# Patient Record
Sex: Male | Born: 1975 | Race: Black or African American | Hispanic: No | Marital: Single | Smoking: Never smoker
Health system: Southern US, Community
[De-identification: ages and names within clinical notes are randomized; demographics above are authoritative.]

## PROBLEM LIST (undated history)

## (undated) DIAGNOSIS — Q2112 Patent foramen ovale: Secondary | ICD-10-CM

## (undated) DIAGNOSIS — I639 Cerebral infarction, unspecified: Secondary | ICD-10-CM

## (undated) DIAGNOSIS — Q211 Atrial septal defect: Secondary | ICD-10-CM

## (undated) DIAGNOSIS — D573 Sickle-cell trait: Secondary | ICD-10-CM

## (undated) SURGERY — Surgical Case
Anesthesia: *Unknown

---

## 1999-05-20 ENCOUNTER — Emergency Department (HOSPITAL_COMMUNITY): Admission: EM | Admit: 1999-05-20 | Discharge: 1999-05-20 | Payer: Self-pay | Admitting: Emergency Medicine

## 1999-05-20 ENCOUNTER — Encounter: Payer: Self-pay | Admitting: Internal Medicine

## 1999-11-04 ENCOUNTER — Emergency Department (HOSPITAL_COMMUNITY): Admission: EM | Admit: 1999-11-04 | Discharge: 1999-11-04 | Payer: Self-pay | Admitting: *Deleted

## 2002-02-27 ENCOUNTER — Ambulatory Visit (HOSPITAL_COMMUNITY): Admission: RE | Admit: 2002-02-27 | Discharge: 2002-02-27 | Payer: Self-pay | Admitting: Family Medicine

## 2002-02-27 ENCOUNTER — Encounter: Payer: Self-pay | Admitting: Family Medicine

## 2002-10-15 ENCOUNTER — Emergency Department (HOSPITAL_COMMUNITY): Admission: EM | Admit: 2002-10-15 | Discharge: 2002-10-15 | Payer: Self-pay | Admitting: Emergency Medicine

## 2003-07-06 ENCOUNTER — Emergency Department (HOSPITAL_COMMUNITY): Admission: EM | Admit: 2003-07-06 | Discharge: 2003-07-07 | Payer: Self-pay | Admitting: Emergency Medicine

## 2005-03-09 ENCOUNTER — Emergency Department (HOSPITAL_COMMUNITY): Admission: EM | Admit: 2005-03-09 | Discharge: 2005-03-09 | Payer: Self-pay | Admitting: *Deleted

## 2013-05-01 ENCOUNTER — Emergency Department (HOSPITAL_COMMUNITY)
Admission: EM | Admit: 2013-05-01 | Discharge: 2013-05-01 | Disposition: A | Discharge status: Home / Self Care | Payer: No Typology Code available for payment source | Source: Home / Self Care | Attending: Emergency Medicine | Admitting: Emergency Medicine

## 2013-05-01 ENCOUNTER — Encounter (HOSPITAL_COMMUNITY): Payer: Self-pay | Admitting: Emergency Medicine

## 2013-05-01 DIAGNOSIS — M545 Low back pain, unspecified: Secondary | ICD-10-CM

## 2013-05-01 MED ORDER — HYDROCODONE-ACETAMINOPHEN 5-325 MG PO TABS: ORAL_TABLET | ORAL | Status: DC

## 2013-05-01 MED ORDER — METHYLPREDNISOLONE ACETATE 80 MG/ML IJ SUSP
80.0000 mg | Freq: Once | INTRAMUSCULAR | Status: AC
  Administered 2013-05-01: 80 mg via INTRAMUSCULAR

## 2013-05-01 MED ORDER — KETOROLAC TROMETHAMINE 60 MG/2ML IM SOLN
60.0000 mg | Freq: Once | INTRAMUSCULAR | Status: AC
  Administered 2013-05-01: 60 mg via INTRAMUSCULAR

## 2013-05-01 MED ORDER — DICLOFENAC SODIUM 75 MG PO TBEC: 75.0000 mg | DELAYED_RELEASE_TABLET | Freq: Two times a day (BID) | ORAL | Status: DC

## 2013-05-01 MED ORDER — KETOROLAC TROMETHAMINE 60 MG/2ML IM SOLN
INTRAMUSCULAR | Status: AC
  Filled 2013-05-01: qty 2

## 2013-05-01 MED ORDER — CYCLOBENZAPRINE HCL 5 MG PO TABS: 5.0000 mg | ORAL_TABLET | Freq: Three times a day (TID) | ORAL | Status: DC | PRN

## 2013-05-01 MED ORDER — METHYLPREDNISOLONE ACETATE 80 MG/ML IJ SUSP
INTRAMUSCULAR | Status: AC
  Filled 2013-05-01: qty 1

## 2013-05-01 NOTE — Discharge Instructions (Signed)
Do exercises twice daily followed by moist heat for 15 minutes. ° ° ° ° ° °Try to be as active as possible. ° °If no better in 2 weeks, follow up with orthopedist. ° ° °

## 2013-05-01 NOTE — ED Provider Notes (Signed)
  Chief Complaint   Chief Complaint  Patient presents with  . Back Pain    History of Present Illness   Daniel Schneider is a 38 year old male who's had a two-day history of lower back pain. He denies any injury. The back has started hurting on its own. There is no radiation the pain. It hurts to bend and lift. He denies any numbness, tingling, or weakness in the lower extremities. No bladder or bowel dysfunction. No saddle anesthesia. Denies fever, chills, or weight loss. No prior history of lower back pain.  Review of Systems   Other than as noted above, the patient denies any of the following symptoms: Systemic:  No fever, chills, or unexplained weight loss. GI:  No abdominal painor incontinence of bowel. GU:  No dysuria, frequency, urgency, or hematuria. No incontinence of urine or urinary retention.  M-S:  No neck pain or arthritis. Neuro:  No paresthesias, headache, saddle anesthesia, muscular weakness, or progressive neurological deficit.  [place]   Past medical history, family history, social history, meds, and allergies were reviewed. Specifically, there is no history of cancer, major trauma, osteoporosis, immunosuppression, or HIV infection.   Physical Examination    Vital signs:  BP 124/68  Pulse 60  Temp(Src) 98.3 F (36.8 C) (Oral)  Resp 14  SpO2 100% General:  Alert, oriented, in no distress. Abdomen:  Soft, non-tender.  No organomegaly or mass.  No pulsatile midline abdominal mass or bruit. Back:  There is tenderness to palpation in the paravertebral muscles of the lower back with muscle spasm. His back has a very limited range of motion with 30 of forward flexion, 10 of backward flexion, 20 of lateral bending, and 45 of rotation with pain. Straight leg raising produces lower back and buttock pain bilaterally but no ringing pain. Neuro:  Normal muscle strength, sensations and DTRs. Extremities: Pedal pulses were full, there was no edema. Skin:  Clear, warm and  dry.  No rash.  Course in [facility] [place]   Given Toradol 60 mg IM and Depo-Medrol 80 mg IM.    Assessment   The encounter diagnosis was Lumbago.  Plan     1.  Meds:  The following meds were prescribed:   Discharge Medication List as of 05/01/2013 11:40 AM    START taking these medications   Details  cyclobenzaprine (FLEXERIL) 5 MG tablet Take 1 tablet (5 mg total) by mouth 3 (three) times daily as needed for muscle spasms., Starting 05/01/2013, Until Discontinued, Normal    diclofenac (VOLTAREN) 75 MG EC tablet Take 1 tablet (75 mg total) by mouth 2 (two) times daily., Starting 05/01/2013, Until Discontinued, Normal    HYDROcodone-acetaminophen (NORCO/VICODIN) 5-325 MG per tablet 1 to 2 tabs every 4 to 6 hours as needed for pain., Print        2.  Patient Education/Counseling:  The patient was given appropriate handouts, self care instructions, and instructed in symptomatic relief. The patient was encouraged to try to be as active as possible and given some exercises to do followed by moist heat.  3.  Follow up:  The patient was told to follow up here if no better in 3 to 4 days, or sooner if becoming worse in any way, and given some red flag symptoms such as worsening pain or new neurological symptoms which would prompt immediate return.  Follow up with Dr. Fredonia Highland if no better in 2 weeks.     Harden Mo, MD 05/01/13 [phone]

## 2013-05-01 NOTE — ED Notes (Signed)
Patient complains of lower back pain; states stiffness and sharpness that keeps him from moving normally.

## 2014-03-06 ENCOUNTER — Encounter (HOSPITAL_COMMUNITY): Payer: Self-pay | Admitting: Emergency Medicine

## 2014-03-06 ENCOUNTER — Emergency Department (INDEPENDENT_AMBULATORY_CARE_PROVIDER_SITE_OTHER)
Admission: EM | Admit: 2014-03-06 | Discharge: 2014-03-06 | Disposition: A | Discharge status: Home / Self Care | Payer: No Typology Code available for payment source | Source: Home / Self Care | Attending: Family Medicine | Admitting: Family Medicine

## 2014-03-06 DIAGNOSIS — M6248 Contracture of muscle, other site: Secondary | ICD-10-CM

## 2014-03-06 DIAGNOSIS — M62838 Other muscle spasm: Secondary | ICD-10-CM

## 2014-03-06 MED ORDER — TRAMADOL HCL 50 MG PO TABS: 50.0000 mg | ORAL_TABLET | Freq: Four times a day (QID) | ORAL | Status: DC | PRN

## 2014-03-06 MED ORDER — KETOROLAC TROMETHAMINE 60 MG/2ML IM SOLN
INTRAMUSCULAR | Status: AC
  Filled 2014-03-06: qty 2

## 2014-03-06 MED ORDER — DICLOFENAC SODIUM 75 MG PO TBEC: 75.0000 mg | DELAYED_RELEASE_TABLET | Freq: Two times a day (BID) | ORAL | Status: DC | PRN

## 2014-03-06 MED ORDER — KETOROLAC TROMETHAMINE 60 MG/2ML IM SOLN
60.0000 mg | Freq: Once | INTRAMUSCULAR | Status: AC
  Administered 2014-03-06: 60 mg via INTRAMUSCULAR

## 2014-03-06 MED ORDER — CYCLOBENZAPRINE HCL 10 MG PO TABS: 10.0000 mg | ORAL_TABLET | Freq: Every evening | ORAL | Status: DC | PRN

## 2014-03-06 NOTE — ED Provider Notes (Signed)
Daniel Schneider is a 39 y.o. male who presents to Urgent Care today for left-sided neck pain. Patient has pain in the left trapezius present for one day. He denies any injury radiating pain weakness or numbness. Symptoms are moderate. No fevers or chills. Patient has tried Tylenol which helps. 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   History reviewed. No pertinent past medical history. History reviewed. No pertinent past surgical history. History  Substance Use Topics  . Smoking status: Never Smoker   . Smokeless tobacco: Not on file  . Alcohol Use: No   ROS as above Medications: Current Facility-Administered Medications  Medication Dose Route Frequency Provider Last Rate Last Dose  . ketorolac (TORADOL) injection 60 mg  60 mg Intramuscular Once Gregor Hams, MD       Current Outpatient Prescriptions  Medication Sig Dispense Refill  . cyclobenzaprine (FLEXERIL) 10 MG tablet Take 1 tablet (10 mg total) by mouth at bedtime as needed for muscle spasms. 20 tablet 0  . diclofenac (VOLTAREN) 75 MG EC tablet Take 1 tablet (75 mg total) by mouth 2 (two) times daily as needed. 30 tablet 0  . traMADol (ULTRAM) 50 MG tablet Take 1 tablet (50 mg total) by mouth every 6 (six) hours as needed. 15 tablet 0   No Known Allergies   Exam:  BP 139/89 mmHg  Pulse 57  Temp(Src) 97.9 F (36.6 C) (Oral)  Resp 16  SpO2 98% Gen: Well NAD HEENT: EOMI,  MMM Lungs: Normal work of breathing. CTABL Heart: RRR no MRG Abd: NABS, Soft. Nondistended, Nontender Exts: Brisk capillary refill, warm and well perfused.  Neck: Nontender to spinal midline. Tender palpation left trapezius. Normal range of motion. Left shoulder nontender normal range of motion negative impingement testing normal strength Right shoulder nontender normal range of motion negative impingement testing normal strength  No results found for this or any previous visit (from the past 24 hour(s)). No results  found.  Assessment and Plan: 39 y.o. male with trapezius strain. Treat with Toradol shot in clinic. We'll use 60 mg IM. Additionally we'll use oral Flexeril diclofenac and tramadol for pain control. Home exercise program and heating pad. Follow up with sports medicine if not improving.  Discussed warning signs or symptoms. Please see discharge instructions. Patient expresses understanding.     Gregor Hams, MD 03/06/14 [phone]

## 2014-03-06 NOTE — Discharge Instructions (Signed)
Thank you for coming in today. Come back or go to the emergency room if you notice new weakness new numbness problems walking or bowel or bladder problems. Do not take tramadol and drive.   Cervical Strain and Sprain (Whiplash) with Rehab Cervical strain and sprain are injuries that commonly occur with "whiplash" injuries. Whiplash occurs when the neck is forcefully whipped backward or forward, such as during a motor vehicle accident or during contact sports. The muscles, ligaments, tendons, discs, and nerves of the neck are susceptible to injury when this occurs. RISK FACTORS Risk of having a whiplash injury increases if:  Osteoarthritis of the spine.  Situations that make head or neck accidents or trauma more likely.  High-risk sports (football, rugby, wrestling, hockey, auto racing, gymnastics, diving, contact karate, or boxing).  Poor strength and flexibility of the neck.  Previous neck injury.  Poor tackling technique.  Improperly fitted or padded equipment. SYMPTOMS   Pain or stiffness in the front or back of neck or both.  Symptoms may present immediately or up to 24 hours after injury.  Dizziness, headache, nausea, and vomiting.  Muscle spasm with soreness and stiffness in the neck.  Tenderness and swelling at the injury site. PREVENTION  Learn and use proper technique (avoid tackling with the head, spearing, and head-butting; use proper falling techniques to avoid landing on the head).  Warm up and stretch properly before activity.  Maintain physical fitness:  Strength, flexibility, and endurance.  Cardiovascular fitness.  Wear properly fitted and padded protective equipment, such as padded soft collars, for participation in contact sports. PROGNOSIS  Recovery from cervical strain and sprain injuries is dependent on the extent of the injury. These injuries are usually curable in 1 week to 3 months with appropriate treatment.  RELATED COMPLICATIONS    Temporary numbness and weakness may occur if the nerve roots are damaged, and this may persist until the nerve has completely healed.  Chronic pain due to frequent recurrence of symptoms.  Prolonged healing, especially if activity is resumed too soon (before complete recovery). TREATMENT  Treatment initially involves the use of ice and medication to help reduce pain and inflammation. It is also important to perform strengthening and stretching exercises and modify activities that worsen symptoms so the injury does not get worse. These exercises may be performed at home or with a therapist. For patients who experience severe symptoms, a soft, padded collar may be recommended to be worn around the neck.  Improving your posture may help reduce symptoms. Posture improvement includes pulling your chin and abdomen in while sitting or standing. If you are sitting, sit in a firm chair with your buttocks against the back of the chair. While sleeping, try replacing your pillow with a small towel rolled to 2 inches in diameter, or use a cervical pillow or soft cervical collar. Poor sleeping positions delay healing.  For patients with nerve root damage, which causes numbness or weakness, the use of a cervical traction apparatus may be recommended. Surgery is rarely necessary for these injuries. However, cervical strain and sprains that are present at birth (congenital) may require surgery. MEDICATION   If pain medication is necessary, nonsteroidal anti-inflammatory medications, such as aspirin and ibuprofen, or other minor pain relievers, such as acetaminophen, are often recommended.  Do not take pain medication for 7 days before surgery.  Prescription pain relievers may be given if deemed necessary by your caregiver. Use only as directed and only as much as you need. HEAT AND COLD:  Cold treatment (icing) relieves pain and reduces inflammation. Cold treatment should be applied for 10 to 15 minutes every  2 to 3 hours for inflammation and pain and immediately after any activity that aggravates your symptoms. Use ice packs or an ice massage.  Heat treatment may be used prior to performing the stretching and strengthening activities prescribed by your caregiver, physical therapist, or athletic trainer. Use a heat pack or a warm soak. SEEK MEDICAL CARE IF:   Symptoms get worse or do not improve in 2 weeks despite treatment.  New, unexplained symptoms develop (drugs used in treatment may produce side effects). EXERCISES RANGE OF MOTION (ROM) AND STRETCHING EXERCISES - Cervical Strain and Sprain These exercises may help you when beginning to rehabilitate your injury. In order to successfully resolve your symptoms, you must improve your posture. These exercises are designed to help reduce the forward-head and rounded-shoulder posture which contributes to this condition. Your symptoms may resolve with or without further involvement from your physician, physical therapist or athletic trainer. While completing these exercises, remember:   Restoring tissue flexibility helps normal motion to return to the joints. This allows healthier, less painful movement and activity.  An effective stretch should be held for at least 20 seconds, although you may need to begin with shorter hold times for comfort.  A stretch should never be painful. You should only feel a gentle lengthening or release in the stretched tissue. STRETCH- Axial Extensors  Lie on your back on the floor. You may bend your knees for comfort. Place a rolled-up hand towel or dish towel, about 2 inches in diameter, under the part of your head that makes contact with the floor.  Gently tuck your chin, as if trying to make a "double chin," until you feel a gentle stretch at the base of your head.  Hold __________ seconds. Repeat __________ times. Complete this exercise __________ times per day.  STRETCH - Axial Extension   Stand or sit on a firm  surface. Assume a good posture: chest up, shoulders drawn back, abdominal muscles slightly tense, knees unlocked (if standing) and feet hip width apart.  Slowly retract your chin so your head slides back and your chin slightly lowers. Continue to look straight ahead.  You should feel a gentle stretch in the back of your head. Be certain not to feel an aggressive stretch since this can cause headaches later.  Hold for __________ seconds. Repeat __________ times. Complete this exercise __________ times per day. STRETCH - Cervical Side Bend   Stand or sit on a firm surface. Assume a good posture: chest up, shoulders drawn back, abdominal muscles slightly tense, knees unlocked (if standing) and feet hip width apart.  Without letting your nose or shoulders move, slowly tip your right / left ear to your shoulder until your feel a gentle stretch in the muscles on the opposite side of your neck.  Hold __________ seconds. Repeat __________ times. Complete this exercise __________ times per day. STRETCH - Cervical Rotators   Stand or sit on a firm surface. Assume a good posture: chest up, shoulders drawn back, abdominal muscles slightly tense, knees unlocked (if standing) and feet hip width apart.  Keeping your eyes level with the ground, slowly turn your head until you feel a gentle stretch along the back and opposite side of your neck.  Hold __________ seconds. Repeat __________ times. Complete this exercise __________ times per day. RANGE OF MOTION - Neck Circles   Stand or sit on a  firm surface. Assume a good posture: chest up, shoulders drawn back, abdominal muscles slightly tense, knees unlocked (if standing) and feet hip width apart.  Gently roll your head down and around from the back of one shoulder to the back of the other. The motion should never be forced or painful.  Repeat the motion 10-20 times, or until you feel the neck muscles relax and loosen. Repeat __________ times. Complete  the exercise __________ times per day. STRENGTHENING EXERCISES - Cervical Strain and Sprain These exercises may help you when beginning to rehabilitate your injury. They may resolve your symptoms with or without further involvement from your physician, physical therapist, or athletic trainer. While completing these exercises, remember:   Muscles can gain both the endurance and the strength needed for everyday activities through controlled exercises.  Complete these exercises as instructed by your physician, physical therapist, or athletic trainer. Progress the resistance and repetitions only as guided.  You may experience muscle soreness or fatigue, but the pain or discomfort you are trying to eliminate should never worsen during these exercises. If this pain does worsen, stop and make certain you are following the directions exactly. If the pain is still present after adjustments, discontinue the exercise until you can discuss the trouble with your clinician. STRENGTH - Cervical Flexors, Isometric  Face a wall, standing about 6 inches away. Place a small pillow, a ball about 6-8 inches in diameter, or a folded towel between your forehead and the wall.  Slightly tuck your chin and gently push your forehead into the soft object. Push only with mild to moderate intensity, building up tension gradually. Keep your jaw and forehead relaxed.  Hold 10 to 20 seconds. Keep your breathing relaxed.  Release the tension slowly. Relax your neck muscles completely before you start the next repetition. Repeat __________ times. Complete this exercise __________ times per day. STRENGTH- Cervical Lateral Flexors, Isometric   Stand about 6 inches away from a wall. Place a small pillow, a ball about 6-8 inches in diameter, or a folded towel between the side of your head and the wall.  Slightly tuck your chin and gently tilt your head into the soft object. Push only with mild to moderate intensity, building up  tension gradually. Keep your jaw and forehead relaxed.  Hold 10 to 20 seconds. Keep your breathing relaxed.  Release the tension slowly. Relax your neck muscles completely before you start the next repetition. Repeat __________ times. Complete this exercise __________ times per day. STRENGTH - Cervical Extensors, Isometric   Stand about 6 inches away from a wall. Place a small pillow, a ball about 6-8 inches in diameter, or a folded towel between the back of your head and the wall.  Slightly tuck your chin and gently tilt your head back into the soft object. Push only with mild to moderate intensity, building up tension gradually. Keep your jaw and forehead relaxed.  Hold 10 to 20 seconds. Keep your breathing relaxed.  Release the tension slowly. Relax your neck muscles completely before you start the next repetition. Repeat __________ times. Complete this exercise __________ times per day. POSTURE AND BODY MECHANICS CONSIDERATIONS - Cervical Strain and Sprain Keeping correct posture when sitting, standing or completing your activities will reduce the stress put on different body tissues, allowing injured tissues a chance to heal and limiting painful experiences. The following are general guidelines for improved posture. Your physician or physical therapist will provide you with any instructions specific to your needs. While reading  these guidelines, remember:  The exercises prescribed by your provider will help you have the flexibility and strength to maintain correct postures.  The correct posture provides the optimal environment for your joints to work. All of your joints have less wear and tear when properly supported by a spine with good posture. This means you will experience a healthier, less painful body.  Correct posture must be practiced with all of your activities, especially prolonged sitting and standing. Correct posture is as important when doing repetitive low-stress activities  (typing) as it is when doing a single heavy-load activity (lifting). PROLONGED STANDING WHILE SLIGHTLY LEANING FORWARD When completing a task that requires you to lean forward while standing in one place for a long time, place either foot up on a stationary 2- to 4-inch high object to help maintain the best posture. When both feet are on the ground, the low back tends to lose its slight inward curve. If this curve flattens (or becomes too large), then the back and your other joints will experience too much stress, fatigue more quickly, and can cause pain.  RESTING POSITIONS Consider which positions are most painful for you when choosing a resting position. If you have pain with flexion-based activities (sitting, bending, stooping, squatting), choose a position that allows you to rest in a less flexed posture. You would want to avoid curling into a fetal position on your side. If your pain worsens with extension-based activities (prolonged standing, working overhead), avoid resting in an extended position such as sleeping on your stomach. Most people will find more comfort when they rest with their spine in a more neutral position, neither too rounded nor too arched. Lying on a non-sagging bed on your side with a pillow between your knees, or on your back with a pillow under your knees will often provide some relief. Keep in mind, being in any one position for a prolonged period of time, no matter how correct your posture, can still lead to stiffness. WALKING Walk with an upright posture. Your ears, shoulders, and hips should all line up. OFFICE WORK When working at a desk, create an environment that supports good, upright posture. Without extra support, muscles fatigue and lead to excessive strain on joints and other tissues. CHAIR:  A chair should be able to slide under your desk when your back makes contact with the back of the chair. This allows you to work closely.  The chair's height should allow  your eyes to be level with the upper part of your monitor and your hands to be slightly lower than your elbows.  Body position:  Your feet should make contact with the floor. If this is not possible, use a foot rest.  Keep your ears over your shoulders. This will reduce stress on your neck and low back. Document Released: 01/25/2005 Document Revised: 06/11/2013 Document Reviewed: 05/09/2008 [facility] Patient Information 2015 [place], [place]. This information is not intended to replace advice given to you by your health care provider. Make sure you discuss any questions you have with your health care provider.

## 2014-03-06 NOTE — ED Notes (Signed)
C/o left shoulder pain and neck pain onset yest night Denies inj/trauma Pain is constant Alert, no signs of acute distress.

## 2014-11-19 ENCOUNTER — Emergency Department (HOSPITAL_COMMUNITY): Payer: No Typology Code available for payment source

## 2014-11-19 ENCOUNTER — Encounter (HOSPITAL_COMMUNITY): Payer: Self-pay | Admitting: Emergency Medicine

## 2014-11-19 ENCOUNTER — Emergency Department (HOSPITAL_COMMUNITY)
Admission: EM | Admit: 2014-11-19 | Discharge: 2014-11-19 | Disposition: A | Discharge status: Home / Self Care | Payer: No Typology Code available for payment source | Attending: Emergency Medicine | Admitting: Emergency Medicine

## 2014-11-19 DIAGNOSIS — Z7982 Long term (current) use of aspirin: Secondary | ICD-10-CM | POA: Insufficient documentation

## 2014-11-19 DIAGNOSIS — R079 Chest pain, unspecified: Secondary | ICD-10-CM

## 2014-11-19 LAB — CBC
HCT: 41.3 % (ref 39.0–52.0)
Hemoglobin: 14.3 g/dL (ref 13.0–17.0)
MCH: 28.9 pg (ref 26.0–34.0)
MCHC: 34.6 g/dL (ref 30.0–36.0)
MCV: 83.6 fL (ref 78.0–100.0)
PLATELETS: 269 10*3/uL (ref 150–400)
RBC: 4.94 MIL/uL (ref 4.22–5.81)
RDW: 13.7 % (ref 11.5–15.5)
WBC: 5.4 10*3/uL (ref 4.0–10.5)

## 2014-11-19 LAB — BASIC METABOLIC PANEL
Anion gap: 9 (ref 5–15)
BUN: 12 mg/dL (ref 6–20)
CHLORIDE: 106 mmol/L (ref 101–111)
CO2: 26 mmol/L (ref 22–32)
CREATININE: 1.5 mg/dL — AB (ref 0.61–1.24)
Calcium: 9.7 mg/dL (ref 8.9–10.3)
GFR calc non Af Amer: 57 mL/min — ABNORMAL LOW (ref >60)
GLUCOSE: 109 mg/dL — AB (ref 65–99)
Potassium: 4.2 mmol/L (ref 3.5–5.1)
Sodium: 141 mmol/L (ref 135–145)

## 2014-11-19 LAB — I-STAT TROPONIN, ED
Troponin i, poc: 0 ng/mL (ref 0.00–0.08)
Troponin i, poc: 0 ng/mL (ref 0.00–0.08)

## 2014-11-19 MED ORDER — ACETAMINOPHEN 500 MG PO TABS
500.0000 mg | ORAL_TABLET | Freq: Once | ORAL | Status: AC
  Administered 2014-11-19: 500 mg via ORAL
  Filled 2014-11-19: qty 1

## 2014-11-19 NOTE — ED Notes (Signed)
Pt sts right sided CP worse with cough and inspiration x 2 days

## 2014-11-19 NOTE — Discharge Instructions (Signed)
Chest Pain Observation It is often hard to give a specific diagnosis for the cause of chest pain. Among other possibilities your symptoms might be caused by inadequate oxygen delivery to your heart (angina). Angina that is not treated or evaluated can lead to a heart attack (myocardial infarction) or death. Blood tests, electrocardiograms, and X-rays may have been done to help determine a possible cause of your chest pain. After evaluation and observation, your health care provider has determined that it is unlikely your pain was caused by an unstable condition that requires hospitalization. However, a full evaluation of your pain may need to be completed, with additional diagnostic testing as directed. It is very important to keep your follow-up appointments. Not keeping your follow-up appointments could result in permanent heart damage, disability, or death. If there is any problem keeping your follow-up appointments, you must call your health care provider. HOME CARE INSTRUCTIONS  Due to the slight chance that your pain could be angina, it is important to follow your health care provider's treatment plan and also maintain a healthy lifestyle:  Maintain or work toward achieving a healthy weight.  Stay physically active and exercise regularly.  Decrease your salt intake.  Eat a balanced, healthy diet. Talk to a dietitian to learn about heart-healthy foods.  Increase your fiber intake by including whole grains, vegetables, fruits, and nuts in your diet.  Avoid situations that cause stress, anger, or depression.  Take medicines as advised by your health care provider. Report any side effects to your health care provider. Do not stop medicines or adjust the dosages on your own.  Quit smoking. Do not use nicotine patches or gum until you check with your health care provider.  Keep your blood pressure, blood sugar, and cholesterol levels within normal limits.  Limit alcohol intake to no more than  1 drink per day for women who are not pregnant and 2 drinks per day for men.  Do not abuse drugs. SEEK IMMEDIATE MEDICAL CARE IF: You have severe chest pain or pressure which may include symptoms such as:  You feel pain or pressure in your arms, neck, jaw, or back.  You have severe back or abdominal pain, feel sick to your stomach (nauseous), or throw up (vomit).  You are sweating profusely.  You are having a fast or irregular heartbeat.  You feel short of breath while at rest.  You notice increasing shortness of breath during rest, sleep, or with activity.  You have chest pain that does not get better after rest or after taking your usual medicine.  You wake from sleep with chest pain.  You are unable to sleep because you cannot breathe.  You develop a frequent cough or you are coughing up blood.  You feel dizzy, faint, or experience extreme fatigue.  You develop severe weakness, dizziness, fainting, or chills. Any of these symptoms may represent a serious problem that is an emergency. Do not wait to see if the symptoms will go away. Call your local emergency services (911 in the [place] Do not drive yourself to the hospital. MAKE SURE YOU:  Understand these instructions.  Will watch your condition.  Will get help right away if you are not doing well or get worse.   This information is not intended to replace advice given to you by your health care provider. Make sure you discuss any questions you have with your health care provider.   Follow up with cardiology for further evaluation of chest pain. May need  stress test. Return to emergency department if you expands worsening of her symptoms, recurrent chest pain, shortness of breath, fever, headache, blurry vision.

## 2014-11-21 NOTE — ED Provider Notes (Signed)
CSN: [id number]     Arrival date & time 11/19/14  1447 History   First MD Initiated Contact with Patient 11/19/14 1909     Chief Complaint  Patient presents with  . Chest Pain     (Consider location/radiation/quality/duration/timing/severity/associated sxs/prior Treatment) HPI   Daniel Schneider is a 39 y.o M with no significant pmhx who presents to the ED today c/o chest pain that "feels like pressure in the middle of my chest" that began 2 days ago. Pain is worsened with inspiration and cough. Pain is intermittent and is felt mostly at rest while lying down. Denies personal or family cardiac history. Pain does not radiate. Denies syncope, SOB, weakness, fevers, dizziness, paresthesias, headache. Denies tobacco or alcohol use.  History reviewed. No pertinent past medical history. History reviewed. No pertinent past surgical history. History reviewed. No pertinent family history. Social History  Substance Use Topics  . Smoking status: Never Smoker   . Smokeless tobacco: None  . Alcohol Use: No    Review of Systems  All other systems reviewed and are negative.     Allergies  Review of patient's allergies indicates no known allergies.  Home Medications   Prior to Admission medications   Medication Sig Start Date End Date Taking? Authorizing Provider  acetaminophen (TYLENOL) 325 MG tablet Take 650 mg by mouth every 6 (six) hours as needed for mild pain.   Yes Historical Provider, MD  aspirin 325 MG tablet Take 650 mg by mouth daily.   Yes Historical Provider, MD   BP 120/80 mmHg  Pulse 59  Temp(Src) 98.5 F (36.9 C) (Oral)  Resp 16  SpO2 99% Physical Exam  Constitutional: He is oriented to person, place, and time. He appears well-developed and well-nourished. No distress.  HENT:  Head: Normocephalic and atraumatic.  Mouth/Throat: No oropharyngeal exudate.  Eyes: Conjunctivae and EOM are normal. Pupils are equal, round, and reactive to light. Right eye exhibits no  discharge. Left eye exhibits no discharge. No scleral icterus.  Neck: Normal range of motion. Neck supple. No JVD present.  Cardiovascular: Normal rate, regular rhythm, normal heart sounds and intact distal pulses.  Exam reveals no gallop and no friction rub.   No murmur heard. Pulmonary/Chest: Effort normal and breath sounds normal. No respiratory distress. He has no wheezes. He has no rales. He exhibits no tenderness.  Abdominal: Soft. He exhibits no distension. There is no tenderness. There is no guarding.  Musculoskeletal: Normal range of motion. He exhibits no edema.  Lymphadenopathy:    He has no cervical adenopathy.  Neurological: He is alert and oriented to person, place, and time. No cranial nerve deficit.  Strength 5/5 throughout. No sensory deficits.  No gait abnormality.  Skin: Skin is warm and dry. No rash noted. He is not diaphoretic. No erythema. No pallor.  Psychiatric: He has a normal mood and affect. His behavior is normal.  Nursing note and vitals reviewed.   ED Course  Procedures (including critical care time) Labs Review Labs Reviewed  BASIC METABOLIC PANEL - Abnormal; Notable for the following:    Glucose, Bld 109 (*)    Creatinine, Ser 1.50 (*)    GFR calc non Af Amer 57 (*)    All other components within normal limits  CBC  I-STAT TROPOININ, ED  I-STAT TROPOININ, ED    Imaging Review No results found. I have personally reviewed and evaluated these images and lab results as part of my medical decision-making.   EKG Interpretation   Date/Time:  Tuesday November 19 2014 14:56:07 EDT Ventricular Rate:  89 PR Interval:  188 QRS Duration: 80 QT Interval:  346 QTC Calculation: 420 R Axis:   59 Text Interpretation:  Normal sinus rhythm Right atrial enlargement  Abnormal ECG No old tracing to compare Confirmed by [facility]  MD, TREY  ([id number]) on 11/19/2014 9:24:19 PM      MDM   Final diagnoses:  Chest pain, unspecified chest pain type    Pt presents  with chest pain/pressure onset 2 days ago. Pt is LOW HEART score. EKG wnl. Trop and delta trop wnl. CXR negative. Lung exam CTAB. Low suspicion ACS. Pt not hypoxic, SOB, or tachycardic. PERC negative. Do not suspect PE. Pt given tylenol which improved pain.  Recommend follow up wth cardiology for possible stress test. Discussed treatment plan with pt who is agreeable. Return precautions outlined in patient discharge instructions.      Dondra Spry [place], PA-C 11/21/14 1625  Serita Grit, MD 11/22/14 2026

## 2015-04-19 ENCOUNTER — Emergency Department (HOSPITAL_COMMUNITY): Payer: BLUE CROSS/BLUE SHIELD

## 2015-04-19 ENCOUNTER — Inpatient Hospital Stay (HOSPITAL_COMMUNITY)
Admission: EM | Admit: 2015-04-19 | Discharge: 2015-04-22 | DRG: 065 | Disposition: A | Discharge status: Home / Self Care | Payer: BLUE CROSS/BLUE SHIELD | Attending: Internal Medicine | Admitting: Internal Medicine

## 2015-04-19 ENCOUNTER — Observation Stay (HOSPITAL_COMMUNITY): Payer: BLUE CROSS/BLUE SHIELD

## 2015-04-19 ENCOUNTER — Encounter (HOSPITAL_COMMUNITY): Payer: Self-pay | Admitting: Emergency Medicine

## 2015-04-19 DIAGNOSIS — Z7982 Long term (current) use of aspirin: Secondary | ICD-10-CM

## 2015-04-19 DIAGNOSIS — Q211 Atrial septal defect: Secondary | ICD-10-CM

## 2015-04-19 DIAGNOSIS — R4781 Slurred speech: Secondary | ICD-10-CM | POA: Diagnosis present

## 2015-04-19 DIAGNOSIS — G459 Transient cerebral ischemic attack, unspecified: Secondary | ICD-10-CM | POA: Diagnosis present

## 2015-04-19 DIAGNOSIS — H538 Other visual disturbances: Secondary | ICD-10-CM | POA: Diagnosis present

## 2015-04-19 DIAGNOSIS — I63411 Cerebral infarction due to embolism of right middle cerebral artery: Principal | ICD-10-CM | POA: Diagnosis present

## 2015-04-19 DIAGNOSIS — I639 Cerebral infarction, unspecified: Secondary | ICD-10-CM | POA: Diagnosis not present

## 2015-04-19 DIAGNOSIS — E785 Hyperlipidemia, unspecified: Secondary | ICD-10-CM | POA: Diagnosis present

## 2015-04-19 DIAGNOSIS — Q2112 Patent foramen ovale: Secondary | ICD-10-CM | POA: Insufficient documentation

## 2015-04-19 DIAGNOSIS — I459 Conduction disorder, unspecified: Secondary | ICD-10-CM | POA: Diagnosis present

## 2015-04-19 DIAGNOSIS — R29701 NIHSS score 1: Secondary | ICD-10-CM | POA: Diagnosis present

## 2015-04-19 LAB — DIFFERENTIAL
BASOS ABS: 0 10*3/uL (ref 0.0–0.1)
BASOS PCT: 1 %
EOS ABS: 0.1 10*3/uL (ref 0.0–0.7)
Eosinophils Relative: 2 %
Lymphocytes Relative: 22 %
Lymphs Abs: 1.2 10*3/uL (ref 0.7–4.0)
Monocytes Absolute: 0.5 10*3/uL (ref 0.1–1.0)
Monocytes Relative: 9 %
NEUTROS ABS: 3.7 10*3/uL (ref 1.7–7.7)
Neutrophils Relative %: 66 %

## 2015-04-19 LAB — I-STAT CHEM 8, ED
BUN: 19 mg/dL (ref 6–20)
CALCIUM ION: 1.16 mmol/L (ref 1.12–1.23)
Chloride: 103 mmol/L (ref 101–111)
Creatinine, Ser: 1.4 mg/dL — ABNORMAL HIGH (ref 0.61–1.24)
Glucose, Bld: 96 mg/dL (ref 65–99)
HEMATOCRIT: 47 % (ref 39.0–52.0)
Hemoglobin: 16 g/dL (ref 13.0–17.0)
POTASSIUM: 4.2 mmol/L (ref 3.5–5.1)
Sodium: 142 mmol/L (ref 135–145)
TCO2: 25 mmol/L (ref 0–100)

## 2015-04-19 LAB — COMPREHENSIVE METABOLIC PANEL
ALBUMIN: 4.4 g/dL (ref 3.5–5.0)
ALT: 25 U/L (ref 17–63)
AST: 29 U/L (ref 15–41)
Alkaline Phosphatase: 57 U/L (ref 38–126)
Anion gap: 10 (ref 5–15)
BUN: 15 mg/dL (ref 6–20)
CHLORIDE: 103 mmol/L (ref 101–111)
CO2: 25 mmol/L (ref 22–32)
CREATININE: 1.59 mg/dL — AB (ref 0.61–1.24)
Calcium: 9.4 mg/dL (ref 8.9–10.3)
GFR calc Af Amer: >60 mL/min (ref >60)
GFR calc non Af Amer: 52 mL/min — ABNORMAL LOW (ref >60)
Glucose, Bld: 103 mg/dL — ABNORMAL HIGH (ref 65–99)
POTASSIUM: 4.2 mmol/L (ref 3.5–5.1)
Sodium: 138 mmol/L (ref 135–145)
Total Bilirubin: 0.8 mg/dL (ref 0.3–1.2)
Total Protein: 7 g/dL (ref 6.5–8.1)

## 2015-04-19 LAB — PROTIME-INR
INR: 1.12 (ref 0.00–1.49)
PROTHROMBIN TIME: 14.6 s (ref 11.6–15.2)

## 2015-04-19 LAB — CBC
HEMATOCRIT: 43.5 % (ref 39.0–52.0)
Hemoglobin: 14.6 g/dL (ref 13.0–17.0)
MCH: 27.9 pg (ref 26.0–34.0)
MCHC: 33.6 g/dL (ref 30.0–36.0)
MCV: 83.2 fL (ref 78.0–100.0)
PLATELETS: 245 10*3/uL (ref 150–400)
RBC: 5.23 MIL/uL (ref 4.22–5.81)
RDW: 13.8 % (ref 11.5–15.5)
WBC: 5.5 10*3/uL (ref 4.0–10.5)

## 2015-04-19 LAB — I-STAT TROPONIN, ED: TROPONIN I, POC: 0 ng/mL (ref 0.00–0.08)

## 2015-04-19 LAB — APTT: APTT: 30 s (ref 24–37)

## 2015-04-19 MED ORDER — SENNOSIDES-DOCUSATE SODIUM 8.6-50 MG PO TABS: 1.0000 | ORAL_TABLET | Freq: Every evening | ORAL | Status: DC | PRN

## 2015-04-19 MED ORDER — STROKE: EARLY STAGES OF RECOVERY BOOK
Freq: Once | Status: AC
  Administered 2015-04-19: 21:00:00

## 2015-04-19 MED ORDER — ENOXAPARIN SODIUM 40 MG/0.4ML ~~LOC~~ SOLN
40.0000 mg | SUBCUTANEOUS | Status: DC
  Administered 2015-04-19 – 2015-04-21 (×3): 40 mg via SUBCUTANEOUS
  Filled 2015-04-19 (×3): qty 0.4

## 2015-04-19 NOTE — ED Provider Notes (Signed)
CSN: [id number]     Arrival date & time 04/19/15  1208 History   First MD Initiated Contact with Patient 04/19/15 1548     Chief Complaint  Patient presents with  . Numbness   (Consider location/radiation/quality/duration/timing/severity/associated sxs/prior Treatment) HPI 40 y.o. male presents to the Emergency Department today complaining of left sided numbness. Pt states that he woke up this morning around 0900 and was unable to move the left side of his face, or his left arm. No hx CVA. States that he attempted to call out to his daughter, but was unable to due to his slurred speech. Eventually, the numbness subsided and he was back to baseline around 1030-1100. Last seen normal was midnight last night. Pt has no medical problems. Does not take any medications. Has not tried an OTC remedies. No pain currently. Does endorse blurred vision to left eye. No vision loss. No N/V/D. No CP/SOB/ABD pain. No numbness/tingling.   History reviewed. No pertinent past medical history. History reviewed. No pertinent past surgical history. No family history on file. Social History  Substance Use Topics  . Smoking status: Never Smoker   . Smokeless tobacco: None  . Alcohol Use: No    Review of Systems ROS reviewed and all are negative for acute change except as noted in the HPI.  Allergies  Review of patient's allergies indicates no known allergies.  Home Medications   Prior to Admission medications   Medication Sig Start Date End Date Taking? Authorizing Provider  aspirin 325 MG tablet Take 650 mg by mouth daily as needed (for arm and back numbness).    Yes Historical Provider, MD   BP 111/63 mmHg  Pulse 62  Temp(Src) 98.6 F (37 C) (Oral)  Resp 20  Ht 5\' 6"  (1.676 m)  Wt 82.611 kg  BMI 29.41 kg/m2  SpO2 98%   Physical Exam  Constitutional: He is oriented to person, place, and time. He appears well-developed and well-nourished.  HENT:  Head: Normocephalic and atraumatic.  Eyes: EOM  are normal. Pupils are equal, round, and reactive to light.  Neck: Normal range of motion. Neck supple. No tracheal deviation present.  Cardiovascular: Normal rate, regular rhythm and normal heart sounds.   Pulmonary/Chest: Effort normal and breath sounds normal.  Abdominal: Soft. There is no tenderness.  Musculoskeletal: Normal range of motion.  Neurological: He is alert and oriented to person, place, and time. He has normal strength. No cranial nerve deficit or sensory deficit.  Neg Pronator Drift. Able to lift BLE Cranial Nerves:  II: Pupils equal, round, reactive to light III,IV, VI: ptosis not present, extra-ocular motions intact bilaterally  V,VII: smile symmetric, facial light touch sensation equal VIII: hearing grossly normal bilaterally  IX,X: midline uvula rise  XI: bilateral shoulder shrug equal and strong XII: midline tongue extension  Skin: Skin is warm and dry.  Psychiatric: He has a normal mood and affect. His behavior is normal. Thought content normal.  Nursing note and vitals reviewed.  ED Course  Procedures (including critical care time) Labs Review Labs Reviewed  COMPREHENSIVE METABOLIC PANEL - Abnormal; Notable for the following:    Glucose, Bld 103 (*)    Creatinine, Ser 1.59 (*)    GFR calc non Af Amer 52 (*)    All other components within normal limits  I-STAT CHEM 8, ED - Abnormal; Notable for the following:    Creatinine, Ser 1.40 (*)    All other components within normal limits  PROTIME-INR  APTT  CBC  DIFFERENTIAL  I-STAT TROPOININ, ED  CBG MONITORING, ED    Imaging Review Ct Head Wo Contrast  04/19/2015  CLINICAL DATA:  Stroke symptoms, left-sided numbness, pressure behind left eye EXAM: CT HEAD WITHOUT CONTRAST TECHNIQUE: Contiguous axial images were obtained from the base of the skull through the vertex without intravenous contrast. COMPARISON:  None. FINDINGS: No evidence of parenchymal hemorrhage or extra-axial fluid collection. No mass  lesion, mass effect, or midline shift. No CT evidence of acute infarction. Cerebral volume is within normal limits.  No ventriculomegaly. The visualized paranasal sinuses are essentially clear. The mastoid air cells are unopacified. No evidence of calvarial fracture. IMPRESSION: Normal head CT. Electronically Signed   By: Julian Hy M.D.   On: 04/19/2015 13:26   I have personally reviewed and evaluated these images and lab results as part of my medical decision-making.   EKG Interpretation None      MDM  I have reviewed and evaluated the relevant laboratory values.I have reviewed and evaluated the relevant imaging studies.I personally evaluated and interpreted the relevant EKG.I have reviewed the relevant previous healthcare records.I have reviewed EMS Documentation.I obtained HPI from historian. Patient discussed with supervising physician  ED Course:  Assessment: Pt is a 39yM with no sig pmh who presents with left sided facial droop/arm around 0900. Symptoms resolved around 1030-1100 with back to baseline. On exam, pt in NAD. Nontoxic/nonseptic appearing. VSS. Afebrile. Lungs CTA. Heart RRR. Abdomen nontender soft. CN evaluated and unremarkable. No pronator Drift. No BLE weakness. Notes left eye blurriness. No loss of vision. Labs unremarkable. Imaging shows no acute abnormalities. Plan is to admit to medicine for TIA. Consulted with Neurology who agrees. Schedule MRI.    Disposition/Plan:  Admit Pt acknowledges and agrees with plan  Supervising Physician Courteney Julio Alm, MD   Final diagnoses:  Transient cerebral ischemia, unspecified transient cerebral ischemia type      Shary Decamp, PA-C 04/19/15 [place], MD 04/20/15 [phone]

## 2015-04-19 NOTE — H&P (Signed)
[facility] History and Physical  ASHISH KHOURI [id number] DOB: [birth year] DOA: 04/19/2015  Referring physician: er PCP: No PCP Per Patient   Chief Complaint: numbness  HPI: Daniel Schneider is a 40 y.o. male  With no PMHx.  He was in good health until last PM when he developed a cramp in his right leg.  This passed and he was able to go to sleep.  This AM when he woke up, he had complete left sided numbness- face, arm and side of torso to waist.  This was around 9 AM.  He was unable to call for help due to his slurred speech.  All symptoms resolved around 11 AM.  No headache.  No fever, no chills.    In the ER, labs unremarkable except his CR which is normally elevated.  CT scan was normal.   ER spoke with Dr. Nicole Kindred who asked for hospitalists to obs for TIA work up.     Review of Systems:  All systems reviewed, negative unless stated above    History reviewed. No pertinent past medical history. History reviewed. No pertinent past surgical history. Social History:  reports that he has never smoked. He does not have any smokeless tobacco history on file. He reports that he does not drink alcohol or use illicit drugs.  No Known Allergies  Family Hx: +HTN  Prior to Admission medications   Medication Sig Start Date End Date Taking? Authorizing Provider  aspirin 325 MG tablet Take 650 mg by mouth daily as needed (for arm and back numbness).    Yes Historical Provider, MD   Physical Exam: Filed Vitals:   04/19/15 1228 04/19/15 1232 04/19/15 1444  BP:  112/69 111/63  Pulse: 55  62  Temp: 98.6 F (37 C)    TempSrc: Oral    Resp: 20  20  Height: 5\' 6"  (1.676 m)    Weight: 82.611 kg (182 lb 2 oz)    SpO2: 100%  98%    Wt Readings from Last 3 Encounters:  04/19/15 82.611 kg (182 lb 2 oz)    General:  Appears calm and comfortable Eyes: PERRL, normal lids, irises & conjunctiva ENT: grossly normal hearing, lips & tongue Neck: no LAD, masses or  thyromegaly Cardiovascular: RRR, no m/r/g. No LE edema. Telemetry: SR, no arrhythmias  Respiratory: CTA bilaterally, no w/r/r. Normal respiratory effort. Abdomen: soft, ntnd Skin: no rash or induration seen on limited exam Musculoskeletal: grossly normal tone BUE/BLE Psychiatric: grossly normal mood and affect, speech fluent and appropriate Neurologic: grossly non-focal.          Labs on Admission:  Basic Metabolic Panel:  Recent Labs Lab 04/19/15 1235 04/19/15 1248  NA 138 142  K 4.2 4.2  CL 103 103  CO2 25  --   GLUCOSE 103* 96  BUN 15 19  CREATININE 1.59* 1.40*  CALCIUM 9.4  --    Liver Function Tests:  Recent Labs Lab 04/19/15 1235  AST 29  ALT 25  ALKPHOS 57  BILITOT 0.8  PROT 7.0  ALBUMIN 4.4   No results for input(s): LIPASE, AMYLASE in the last 168 hours. No results for input(s): AMMONIA in the last 168 hours. CBC:  Recent Labs Lab 04/19/15 1235 04/19/15 1248  WBC 5.5  --   NEUTROABS 3.7  --   HGB 14.6 16.0  HCT 43.5 47.0  MCV 83.2  --   PLT 245  --    Cardiac Enzymes: No results for input(s): CKTOTAL, CKMB,  CKMBINDEX, TROPONINI in the last 168 hours.  BNP (last 3 results) No results for input(s): BNP in the last 8760 hours.  ProBNP (last 3 results) No results for input(s): PROBNP in the last 8760 hours.  CBG: No results for input(s): GLUCAP in the last 168 hours.  Radiological Exams on Admission: Ct Head Wo Contrast  04/19/2015  CLINICAL DATA:  Stroke symptoms, left-sided numbness, pressure behind left eye EXAM: CT HEAD WITHOUT CONTRAST TECHNIQUE: Contiguous axial images were obtained from the base of the skull through the vertex without intravenous contrast. COMPARISON:  None. FINDINGS: No evidence of parenchymal hemorrhage or extra-axial fluid collection. No mass lesion, mass effect, or midline shift. No CT evidence of acute infarction. Cerebral volume is within normal limits.  No ventriculomegaly. The visualized paranasal sinuses are  essentially clear. The mastoid air cells are unopacified. No evidence of calvarial fracture. IMPRESSION: Normal head CT. Electronically Signed   By: Julian Hy M.D.   On: 04/19/2015 13:26    EKG: Independently reviewed. Sinus brady  Assessment/Plan Active Problems:   TIA (transient ischemic attack)   TIA -ER spoke with Dr. Nicole Kindred -MRI/Echo/carotid/FLP/HgbA1C -symptoms are strange- left face/left arm/and left torso -ASA  CRI -stable   Code Status: full DVT Prophylaxis: Family Communication:  Disposition Plan: home in AM  Time spent: 44 min  [facility] Pager [phone]

## 2015-04-19 NOTE — Consult Note (Signed)
Admission H&P    Chief Complaint: Transient left-sided weakness and numbness.  HPI: Daniel Schneider is an 40 y.o. male with no known medical disorder presenting following an episode of transient weakness and numbness involving his left side, including's arm and trunk and leg. He had difficulty with speech at same time. Interim onset was about 7 AM and lasted about 3-4 hours. He has no previous history of stroke or TIA. CT scan of his head showed no acute intracranial abnormality. NIH stroke score at the time of this evaluation was 1 for slight numbness involving left extremities compared to right extremities  LSN: 10 AM on 04/19/2015 tPA Given: No: Minimal residual symptoms mRankin:  History reviewed. No pertinent past medical history.  History reviewed. No pertinent past surgical history.  Family history: Reviewed and was noncontributory.  Social History:  reports that he has never smoked. He does not have any smokeless tobacco history on file. He reports that he does not drink alcohol or use illicit drugs.  Allergies: No Known Allergies  Medications: Patient was on no medication prior to admission.  ROS: History obtained from the patient  General ROS: negative for - chills, fatigue, fever, night sweats, weight gain or weight loss Psychological ROS: negative for - behavioral disorder, hallucinations, memory difficulties, mood swings or suicidal ideation Ophthalmic ROS: negative for - blurry vision, double vision, eye pain or loss of vision ENT ROS: negative for - epistaxis, nasal discharge, oral lesions, sore throat, tinnitus or vertigo Allergy and Immunology ROS: negative for - hives or itchy/watery eyes Hematological and Lymphatic ROS: negative for - bleeding problems, bruising or swollen lymph nodes Endocrine ROS: negative for - galactorrhea, hair pattern changes, polydipsia/polyuria or temperature intolerance Respiratory ROS: negative for - cough, hemoptysis, shortness of  breath or wheezing Cardiovascular ROS: negative for - chest pain, dyspnea on exertion, edema or irregular heartbeat Gastrointestinal ROS: negative for - abdominal pain, diarrhea, hematemesis, nausea/vomiting or stool incontinence Genito-Urinary ROS: negative for - dysuria, hematuria, incontinence or urinary frequency/urgency Musculoskeletal ROS: negative for - joint swelling or muscular weakness Neurological ROS: as noted in HPI Dermatological ROS: negative for rash and skin lesion changes  Physical Examination: Blood pressure 123/70, pulse 68, temperature 98.6 F (37 C), temperature source Oral, resp. rate 20, height 5\' 6"  (1.676 m), weight 82.611 kg (182 lb 2 oz), SpO2 98 %.  HEENT-  Normocephalic, no lesions, without obvious abnormality.  Normal external eye and conjunctiva.  Normal TM's bilaterally.  Normal auditory canals and external ears. Normal external nose, mucus membranes and septum.  Normal pharynx. Neck supple with no masses, nodes, nodules or enlargement. Cardiovascular - regular rate and rhythm, S1, S2 normal, no murmur, click, rub or gallop Lungs - chest clear, no wheezing, rales, normal symmetric air entry Abdomen - soft, non-tender; bowel sounds normal; no masses,  no organomegaly Extremities - no joint deformities, effusion, or inflammation and no edema  Neurologic Examination: Mental Status: Alert, oriented, thought content appropriate.  Speech fluent without evidence of aphasia. Able to follow commands without difficulty. Cranial Nerves: II-Visual fields were normal. III/IV/VI-Pupils were equal and reacted normally to light. Extraocular movements were full and conjugate.    V/VII-no facial numbness and no facial weakness. VIII-normal. X-normal speech and symmetrical palatal movement. XI: trapezius strength/neck flexion strength normal bilaterally XII-midline tongue extension with normal strength. Motor: 5/5 bilaterally with normal tone and bulk Sensory: Reduced  perception of tactile sensation over left extremities compared to right extremities. Deep Tendon Reflexes: 2+ and symmetric. Plantars:  Flexor bilaterally Cerebellar: Normal finger-to-nose testing. Carotid auscultation: Normal  Results for orders placed or performed during the hospital encounter of 04/19/15 (from the past 48 hour(s))  Protime-INR     Status: None   Collection Time: 04/19/15 12:35 PM  Result Value Ref Range   Prothrombin Time 14.6 11.6 - 15.2 seconds   INR 1.12 0.00 - 1.49  APTT     Status: None   Collection Time: 04/19/15 12:35 PM  Result Value Ref Range   aPTT 30 24 - 37 seconds  CBC     Status: None   Collection Time: 04/19/15 12:35 PM  Result Value Ref Range   WBC 5.5 4.0 - 10.5 K/uL   RBC 5.23 4.22 - 5.81 MIL/uL   Hemoglobin 14.6 13.0 - 17.0 g/dL   HCT 43.5 39.0 - 52.0 %   MCV 83.2 78.0 - 100.0 fL   MCH 27.9 26.0 - 34.0 pg   MCHC 33.6 30.0 - 36.0 g/dL   RDW 13.8 11.5 - 15.5 %   Platelets 245 150 - 400 K/uL  Differential     Status: None   Collection Time: 04/19/15 12:35 PM  Result Value Ref Range   Neutrophils Relative % 66 %   Neutro Abs 3.7 1.7 - 7.7 K/uL   Lymphocytes Relative 22 %   Lymphs Abs 1.2 0.7 - 4.0 K/uL   Monocytes Relative 9 %   Monocytes Absolute 0.5 0.1 - 1.0 K/uL   Eosinophils Relative 2 %   Eosinophils Absolute 0.1 0.0 - 0.7 K/uL   Basophils Relative 1 %   Basophils Absolute 0.0 0.0 - 0.1 K/uL  Comprehensive metabolic panel     Status: Abnormal   Collection Time: 04/19/15 12:35 PM  Result Value Ref Range   Sodium 138 135 - 145 mmol/L   Potassium 4.2 3.5 - 5.1 mmol/L   Chloride 103 101 - 111 mmol/L   CO2 25 22 - 32 mmol/L   Glucose, Bld 103 (H) 65 - 99 mg/dL   BUN 15 6 - 20 mg/dL   Creatinine, Ser 1.59 (H) 0.61 - 1.24 mg/dL   Calcium 9.4 8.9 - 10.3 mg/dL   Total Protein 7.0 6.5 - 8.1 g/dL   Albumin 4.4 3.5 - 5.0 g/dL   AST 29 15 - 41 U/L   ALT 25 17 - 63 U/L   Alkaline Phosphatase 57 38 - 126 U/L   Total Bilirubin 0.8 0.3 -  1.2 mg/dL   GFR calc non Af Amer 52 (L) >60 mL/min   GFR calc Af Amer >60 >60 mL/min    Comment: (NOTE) The eGFR has been calculated using the CKD EPI equation. This calculation has not been validated in all clinical situations. eGFR's persistently <60 mL/min signify possible Chronic Kidney Disease.    Anion gap 10 5 - 15  I-stat troponin, ED (not at [facility], [facility])     Status: None   Collection Time: 04/19/15 12:46 PM  Result Value Ref Range   Troponin i, poc 0.00 0.00 - 0.08 ng/mL   Comment 3            Comment: Due to the release kinetics of cTnI, a negative result within the first hours of the onset of symptoms does not rule out myocardial infarction with certainty. If myocardial infarction is still suspected, repeat the test at appropriate intervals.   I-Stat Chem 8, ED  (not at [facility], [facility])     Status: Abnormal   Collection Time: 04/19/15 12:48 PM  Result Value Ref Range   Sodium 142 135 - 145 mmol/L   Potassium 4.2 3.5 - 5.1 mmol/L   Chloride 103 101 - 111 mmol/L   BUN 19 6 - 20 mg/dL   Creatinine, Ser 1.40 (H) 0.61 - 1.24 mg/dL   Glucose, Bld 96 65 - 99 mg/dL   Calcium, Ion 1.16 1.12 - 1.23 mmol/L   TCO2 25 0 - 100 mmol/L   Hemoglobin 16.0 13.0 - 17.0 g/dL   HCT 47.0 39.0 - 52.0 %   Ct Head Wo Contrast  04/19/2015  CLINICAL DATA:  Stroke symptoms, left-sided numbness, pressure behind left eye EXAM: CT HEAD WITHOUT CONTRAST TECHNIQUE: Contiguous axial images were obtained from the base of the skull through the vertex without intravenous contrast. COMPARISON:  None. FINDINGS: No evidence of parenchymal hemorrhage or extra-axial fluid collection. No mass lesion, mass effect, or midline shift. No CT evidence of acute infarction. Cerebral volume is within normal limits.  No ventriculomegaly. The visualized paranasal sinuses are essentially clear. The mastoid air cells are unopacified. No evidence of calvarial fracture. IMPRESSION: Normal head CT. Electronically Signed   By: Julian Hy M.D.   On: 04/19/2015 13:26    Assessment: 40 y.o. male with no known risk factors for stroke presenting with possible transient ischemic attack involving right MCA vascular territory. Acute stroke is unlikely but cannot be ruled out at this point.  Stroke Risk Factors - none  Plan: 1. HgbA1c, fasting lipid panel 2. MRI, MRA  of the brain without contrast 3. PT consult, OT consult, Speech consult 4. Echocardiogram 5. Carotid dopplers 6. Prophylactic therapy-Antiplatelet med: Aspirin  7. Risk factor modification 8. Telemetry monitoring 9. Hypercoagulopathy panel  C.R. Nicole Kindred, MD [facility] [phone]  04/19/2015, 6:33 PM

## 2015-04-19 NOTE — ED Notes (Signed)
Pt woke up at 0900 with left side numbness and facial twitching. Pt went to sleep at midnight but had a cramp in the right leg extending up to right side of abdomen. Pt denies heavy lifting, twisting or turning yesterday. Pt with left side facial droop, slurred speech and unsteady gait.

## 2015-04-19 NOTE — Progress Notes (Signed)
Pt arrived on unit safely. Patient oriented to unit upon arrival. Pt able to ambulate to hospital bed in unit from stretcher.

## 2015-04-20 ENCOUNTER — Observation Stay (HOSPITAL_COMMUNITY): Payer: BLUE CROSS/BLUE SHIELD

## 2015-04-20 DIAGNOSIS — G459 Transient cerebral ischemic attack, unspecified: Secondary | ICD-10-CM | POA: Diagnosis present

## 2015-04-20 DIAGNOSIS — R4781 Slurred speech: Secondary | ICD-10-CM | POA: Diagnosis present

## 2015-04-20 DIAGNOSIS — I63411 Cerebral infarction due to embolism of right middle cerebral artery: Principal | ICD-10-CM

## 2015-04-20 DIAGNOSIS — Q211 Atrial septal defect: Secondary | ICD-10-CM | POA: Diagnosis not present

## 2015-04-20 DIAGNOSIS — H538 Other visual disturbances: Secondary | ICD-10-CM | POA: Diagnosis present

## 2015-04-20 DIAGNOSIS — I639 Cerebral infarction, unspecified: Secondary | ICD-10-CM

## 2015-04-20 DIAGNOSIS — Z7982 Long term (current) use of aspirin: Secondary | ICD-10-CM | POA: Diagnosis not present

## 2015-04-20 DIAGNOSIS — E785 Hyperlipidemia, unspecified: Secondary | ICD-10-CM | POA: Diagnosis present

## 2015-04-20 DIAGNOSIS — I459 Conduction disorder, unspecified: Secondary | ICD-10-CM | POA: Diagnosis present

## 2015-04-20 DIAGNOSIS — R29701 NIHSS score 1: Secondary | ICD-10-CM | POA: Diagnosis present

## 2015-04-20 LAB — LIPID PANEL
CHOL/HDL RATIO: 5 ratio
CHOLESTEROL: 185 mg/dL (ref 0–200)
HDL: 37 mg/dL — ABNORMAL LOW (ref >40)
LDL CALC: 136 mg/dL — AB (ref 0–99)
TRIGLYCERIDES: 59 mg/dL (ref <150)
VLDL: 12 mg/dL (ref 0–40)

## 2015-04-20 LAB — RAPID URINE DRUG SCREEN, HOSP PERFORMED
Amphetamines: NOT DETECTED
BARBITURATES: NOT DETECTED
Benzodiazepines: NOT DETECTED
Cocaine: NOT DETECTED
Opiates: NOT DETECTED
Tetrahydrocannabinol: NOT DETECTED

## 2015-04-20 LAB — ANTITHROMBIN III: ANTITHROMB III FUNC: 119 % (ref 75–120)

## 2015-04-20 LAB — SEDIMENTATION RATE: Sed Rate: 1 mm/hr (ref 0–16)

## 2015-04-20 LAB — C-REACTIVE PROTEIN: CRP: 0.8 mg/dL (ref <1.0)

## 2015-04-20 MED ORDER — ATORVASTATIN CALCIUM 40 MG PO TABS
40.0000 mg | ORAL_TABLET | Freq: Every day | ORAL | Status: DC
  Administered 2015-04-20 – 2015-04-21 (×2): 40 mg via ORAL
  Filled 2015-04-20 (×2): qty 1

## 2015-04-20 MED ORDER — ASPIRIN EC 325 MG PO TBEC
325.0000 mg | DELAYED_RELEASE_TABLET | Freq: Every day | ORAL | Status: DC
  Administered 2015-04-20 – 2015-04-22 (×2): 325 mg via ORAL
  Filled 2015-04-20 (×2): qty 1

## 2015-04-20 NOTE — Progress Notes (Signed)
VASCULAR LAB PRELIMINARY  PRELIMINARY  PRELIMINARY  PRELIMINARY  Bilateral lower extremity venous duplex and carotid duplex completed.    Preliminary report:    1.  Venous:  Bilateral:  No evidence of DVT, superficial thrombosis, or Baker's Cyst.  2.  Carotid:  Bilateral:  1-39% ICA stenosis.  Vertebral artery flow is antegrade.      Johnae Friley, RVT 04/20/2015, 3:32 PM

## 2015-04-20 NOTE — Progress Notes (Signed)
   Daniel Schneider [id number] DOB: 19-Apr-1975 DOA: 04/19/2015 PCP: No PCP Per Patient  Brief narrative: 40 y/o ? prior episodic urethritis Admitted 04/19/15 for concerns for TIA CT head neg   Past medical history-As per Problem list Chart reviewed as below-   Consultants:  neuro  Procedures:   MRI acute CVA  ECho pending  Antibiotics:  none   Subjective   Fair All deficit in L hand resolved No cp no sob No other concerns   Objective    Interim History:   Telemetry: Sinus brady   Objective: Filed Vitals:   04/19/15 2300 04/20/15 0100 04/20/15 0300 04/20/15 0500  BP: 119/59 121/54 108/50 110/64  Pulse: 52 50 48 46  Temp: 98 F (36.7 C) 98 F (36.7 C) 98 F (36.7 C)   TempSrc: Oral Oral Oral   Resp: 18 16 16 16   Height:      Weight:      SpO2: 98% 99% 98% 99%   No intake or output data in the 24 hours ending 04/20/15 [id number]  Exam:  General: alert fit looking AAM in no distress Cardiovascular: s1 s 2no m/r/g Respiratory: clea rno adde dsoudn Abdomen:  Soft nt nd no rebound Skin intact Neuro no deficit  Data Reviewed: Basic Metabolic Panel:  Recent Labs Lab 04/19/15 1235 04/19/15 1248  NA 138 142  K 4.2 4.2  CL 103 103  CO2 25  --   GLUCOSE 103* 96  BUN 15 19  CREATININE 1.59* 1.40*  CALCIUM 9.4  --    Liver Function Tests:  Recent Labs Lab 04/19/15 1235  AST 29  ALT 25  ALKPHOS 57  BILITOT 0.8  PROT 7.0  ALBUMIN 4.4   No results for input(s): LIPASE, AMYLASE in the last 168 hours. No results for input(s): AMMONIA in the last 168 hours. CBC:  Recent Labs Lab 04/19/15 1235 04/19/15 1248  WBC 5.5  --   NEUTROABS 3.7  --   HGB 14.6 16.0  HCT 43.5 47.0  MCV 83.2  --   PLT 245  --    Cardiac Enzymes: No results for input(s): CKTOTAL, CKMB, CKMBINDEX, TROPONINI in the last 168 hours. BNP: Invalid input(s): POCBNP CBG: No results for input(s): GLUCAP in the last 168 hours.  No results found for this or any  previous visit (from the past 240 hour(s)).   Studies:              All Imaging reviewed and is as per above notation   Scheduled Meds: . enoxaparin (LOVENOX) injection  40 mg Subcutaneous Q24H   Continuous Infusions:    Assessment/Plan:  1. Acute CVA on MRI-complete work-up with ECHO/Carotids and labs.  ASA 325 for 2/2 prevention.  Follow hypercoag profile as OP and keep on tele  ? Home soon once work-up complete   Verneita Griffes, MD  [facility] Pager [phone] 04/20/2015, 7:11 AM

## 2015-04-20 NOTE — Progress Notes (Signed)
STROKE TEAM PROGRESS NOTE   HISTORY OF PRESENT ILLNESS Daniel Schneider is an 40 y.o. male with no known medical disorder presenting following an episode of transient weakness and numbness involving his left side, including's arm and trunk and leg. He had difficulty with speech at same time. Interim onset was about 7 AM and lasted about 3-4 hours. He has no previous history of stroke or TIA. CT scan of his head showed no acute intracranial abnormality. NIH stroke score at the time of this evaluation was 1 for slight numbness involving left extremities compared to right extremities  LSN: 10 AM on 04/19/2015 tPA Given: No: Minimal residual symptoms mRankin:   SUBJECTIVE (INTERVAL HISTORY) The patient's daughter is present. The patient states that he is back to baseline. He believes he has occasional migraines however this is unclear. No other risk factors for stroke identified in this young patient.   OBJECTIVE Temp:  [98 F (36.7 C)-98.6 F (37 C)] 98.1 F (36.7 C) (03/12 1449) Pulse Rate:  [46-66] 54 (03/12 1449) Cardiac Rhythm:  [-] Heart block (03/12 0920) Resp:  [16-20] 18 (03/12 1449) BP: (108-140)/(50-79) 118/59 mmHg (03/12 1449) SpO2:  [98 %-100 %] 99 % (03/12 1449)  CBC:   Recent Labs Lab 04/19/15 1235 04/19/15 1248  WBC 5.5  --   NEUTROABS 3.7  --   HGB 14.6 16.0  HCT 43.5 47.0  MCV 83.2  --   PLT 245  --     Basic Metabolic Panel:   Recent Labs Lab 04/19/15 1235 04/19/15 1248  NA 138 142  K 4.2 4.2  CL 103 103  CO2 25  --   GLUCOSE 103* 96  BUN 15 19  CREATININE 1.59* 1.40*  CALCIUM 9.4  --     Lipid Panel:     Component Value Date/Time   CHOL 185 04/20/2015 0531   TRIG 59 04/20/2015 0531   HDL 37* 04/20/2015 0531   CHOLHDL 5.0 04/20/2015 0531   VLDL 12 04/20/2015 0531   LDLCALC 136* 04/20/2015 0531   HgbA1c: No results found for: HGBA1C Urine Drug Screen: No results found for: LABOPIA, COCAINSCRNUR, LABBENZ, AMPHETMU, THCU, LABBARB     IMAGING  Ct Head Wo Contrast 04/19/2015   Normal head CT.   MRI / MRA HEAD 04/20/2015 1. Small acute right MCA territory frontal lobe cortical infarct. 2. Small chronic right parietal cortical infarct. 3. Normal variant head MRA without evidence of major branch occlusion or significant stenosis.  CUS - Bilateral: 1-39% ICA stenosis. Vertebral artery flow is antegrade.  LE venous doppler - no DVT  2D echo - pending   PHYSICAL EXAM  Temp:  [98 F (36.7 C)-98.6 F (37 C)] 98.1 F (36.7 C) (03/12 1449) Pulse Rate:  [46-66] 54 (03/12 1449) Resp:  [16-20] 18 (03/12 1449) BP: (108-140)/(50-79) 118/59 mmHg (03/12 1449) SpO2:  [98 %-100 %] 99 % (03/12 1449)  General - Well nourished, well developed, in no apparent distress.  Ophthalmologic - Sharp disc margins OU.   Cardiovascular - Regular rate and rhythm with no murmur.  Mental Status -  Level of arousal and orientation to time, place, and person were intact. Language including expression, naming, repetition, comprehension was assessed and found intact. Fund of Knowledge was assessed and was intact.  Cranial Nerves II - XII - II - Visual field intact OU. III, IV, VI - Extraocular movements intact. V - Facial sensation intact bilaterally. VII - Facial movement intact bilaterally. VIII - Hearing & vestibular intact  bilaterally. X - Palate elevates symmetrically. XI - Chin turning & shoulder shrug intact bilaterally. XII - Tongue protrusion intact.  Motor Strength - The patient's strength was normal in all extremities and pronator drift was absent.  Bulk was normal and fasciculations were absent.   Motor Tone - Muscle tone was assessed at the neck and appendages and was normal.  Reflexes - The patient's reflexes were 1+ in all extremities and he had no pathological reflexes.  Sensory - Light touch, temperature/pinprick were assessed and were symmetrical.    Coordination - The patient had normal movements in the  hands and feet with no ataxia or dysmetria.  Tremor was absent.  Gait and Station - The patient's transfers, posture, gait, station, and turns were observed as normal.   ASSESSMENT/PLAN Daniel Schneider is a 40 y.o. male with no significant PMH who presented with numbness involving his left arm and face and speech difficulties.  He did not receive IV t-PA due to minimal deficits.  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   Resultant resolution of deficit  MRI - Small acute right MCA territory cortical infarct. Small chronic right parietal cortical infarct.  MRA unremarkable  Carotid Doppler - unremarkable  2D Echo  Pending  LE venous Dopplers - negative for DVT  hyprecoagulable / autoimmune - pending  Recommend TEE and potential loop recorder to evaluate cardioembolic source  LDL - [id number]  HgbA1c pending  UDS - pending  VTE prophylaxis - Lovenox Diet Heart Room service appropriate?: Yes; Fluid consistency:: Thin Diet NPO time specified  No antithrombotic prior to admission, now on aspirin 325 mg daily.  Patient counseled to be compliant with his antithrombotic medications  Ongoing aggressive stroke risk factor management  Therapy recommendations: Pending  Disposition: Pending  BP management  Stable - although somewhat low at times  Permissive hypertension (OK if < 220/120) but gradually normalize in 5-7 days  Hyperlipidemia  Home meds:  No lipid lowering medications prior to admission  LDL 136, goal < 70  Will add Lipitor 40 mg daily  Continue statin at discharge  Other Stroke Risk Factors  Hx stroke/TIA - by MRI  Pt denies smoking, alcohol or illicit drugs  Pt denies OSA s/s  Pt denies weight loss or malignancy s/s  ? migraine  Hospital day # 0  Rosalin Hawking, MD PhD Stroke Neurology 04/20/2015 5:09 PM     To contact Stroke  Continuity provider, please refer to [email or web address] After hours, contact General Neurology

## 2015-04-21 ENCOUNTER — Other Ambulatory Visit: Payer: Self-pay | Admitting: Physician Assistant

## 2015-04-21 ENCOUNTER — Encounter (HOSPITAL_COMMUNITY)
Admission: EM | Disposition: A | Discharge status: Home / Self Care | Payer: Self-pay | Source: Home / Self Care | Attending: Internal Medicine

## 2015-04-21 ENCOUNTER — Inpatient Hospital Stay (HOSPITAL_COMMUNITY): Payer: BLUE CROSS/BLUE SHIELD

## 2015-04-21 ENCOUNTER — Encounter (HOSPITAL_COMMUNITY): Payer: Self-pay | Admitting: *Deleted

## 2015-04-21 DIAGNOSIS — Q211 Atrial septal defect: Secondary | ICD-10-CM | POA: Insufficient documentation

## 2015-04-21 DIAGNOSIS — I639 Cerebral infarction, unspecified: Secondary | ICD-10-CM

## 2015-04-21 DIAGNOSIS — G459 Transient cerebral ischemic attack, unspecified: Secondary | ICD-10-CM

## 2015-04-21 DIAGNOSIS — Q2112 Patent foramen ovale: Secondary | ICD-10-CM | POA: Insufficient documentation

## 2015-04-21 DIAGNOSIS — E785 Hyperlipidemia, unspecified: Secondary | ICD-10-CM

## 2015-04-21 HISTORY — PX: TEE WITHOUT CARDIOVERSION: SHX5443

## 2015-04-21 LAB — ECHOCARDIOGRAM LIMITED
CHL CUP AORTIC ROOT 2D: 29 mm
CHL CUP LA VOL 2D: 41 mL
CHL CUP MV DEC (S): 250 ms
EWDT: 250 ms
FS: 27 % — AB (ref 28–44)
HEIGHTINCHES: 66 in
IV/PV OW: 0.97
LA SIZE INDEX: 1.56 mm/m2
LA VOL 2D INDEX: 21.4 mL/m2
LA diam end sys: 30 mm
LA vol index: 28.6 mL/m2
LA vol: 55 mL
LASIZE: 30 mm
LV PW d: 9.9 mm — AB (ref 0.6–1.1)
LV PW s: 9.9 mm
LVIDD: 45.6 mm — AB (ref 3.5–6.0)
LVIDS: 33.1 mm — AB (ref 2.1–4.0)
MV pk A vel: 37 cm/s
MV pk E vel: 67.6 cm/s
WEIGHTICAEL: 2914 [oz_av]

## 2015-04-21 LAB — BASIC METABOLIC PANEL
Anion gap: 12 (ref 5–15)
BUN: 17 mg/dL (ref 6–20)
CO2: 25 mmol/L (ref 22–32)
CREATININE: 1.48 mg/dL — AB (ref 0.61–1.24)
Calcium: 9.2 mg/dL (ref 8.9–10.3)
Chloride: 106 mmol/L (ref 101–111)
GFR calc Af Amer: >60 mL/min (ref >60)
GFR, EST NON AFRICAN AMERICAN: 57 mL/min — AB (ref >60)
Glucose, Bld: 102 mg/dL — ABNORMAL HIGH (ref 65–99)
POTASSIUM: 3.9 mmol/L (ref 3.5–5.1)
SODIUM: 143 mmol/L (ref 135–145)

## 2015-04-21 LAB — PROTEIN C, TOTAL: PROTEIN C, TOTAL: 82 % (ref 60–150)

## 2015-04-21 LAB — SICKLE CELL SCREEN: SICKLE CELL SCREEN: POSITIVE — AB

## 2015-04-21 LAB — HOMOCYSTEINE: Homocysteine: 11.6 umol/L (ref 0.0–15.0)

## 2015-04-21 LAB — HEMOGLOBIN A1C
Hgb A1c MFr Bld: 5.8 % — ABNORMAL HIGH (ref 4.8–5.6)
Mean Plasma Glucose: 120 mg/dL

## 2015-04-21 LAB — ANTINUCLEAR ANTIBODIES, IFA: ANA Ab, IFA: NEGATIVE

## 2015-04-21 SURGERY — ECHOCARDIOGRAM, TRANSESOPHAGEAL
Anesthesia: Moderate Sedation

## 2015-04-21 MED ORDER — MIDAZOLAM HCL 5 MG/ML IJ SOLN
INTRAMUSCULAR | Status: AC
  Filled 2015-04-21: qty 2

## 2015-04-21 MED ORDER — FENTANYL CITRATE (PF) 100 MCG/2ML IJ SOLN
INTRAMUSCULAR | Status: AC
  Filled 2015-04-21: qty 2

## 2015-04-21 MED ORDER — SODIUM CHLORIDE 0.9 % IV SOLN: INTRAVENOUS | Status: DC

## 2015-04-21 MED ORDER — BUTAMBEN-TETRACAINE-BENZOCAINE 2-2-14 % EX AERO
INHALATION_SPRAY | CUTANEOUS | Status: DC | PRN
Start: 2015-04-21 — End: 2015-04-21
  Administered 2015-04-21: 2 via TOPICAL

## 2015-04-21 MED ORDER — MIDAZOLAM HCL 10 MG/2ML IJ SOLN
INTRAMUSCULAR | Status: DC | PRN
  Administered 2015-04-21 (×2): 2 mg via INTRAVENOUS
  Administered 2015-04-21: 1 mg via INTRAVENOUS

## 2015-04-21 MED ORDER — FENTANYL CITRATE (PF) 100 MCG/2ML IJ SOLN
INTRAMUSCULAR | Status: DC | PRN
  Administered 2015-04-21 (×2): 25 ug via INTRAVENOUS
  Administered 2015-04-21: 12.5 ug via INTRAVENOUS

## 2015-04-21 NOTE — H&P (View-Only) (Signed)
[facility] PROGRESS NOTE  Daniel Schneider [id number] DOB: Mar 20, 1975 DOA: 04/19/2015 PCP: No PCP Per Patient  Assessment/Plan: #1 acute nondominant right MCA cortical infarct Consent for embolic CVA. 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 Clinical improvement. Patient states at baseline. No dysarthric speech. Weakness and numbness on the left side have resolved. Carotid Dopplers with no significant ICA stenosis. Lower extremity Dopplers negative. 2-D echo pending. LDL of 136 goal LDL less than 70. UDS negative. Hemoglobin A1c is 5.8. Hypercoagulable panel pending. Continue aspirin for secondary stroke prevention. Patient for TEE and potential loop recorder today to evaluate for cardioembolic source. Patient has been started on Lipitor.  #2 blood pressure Stable.  #3 hyperlipidemia LDL of 136. Goal LDL less than 70. Patient has been started on Lipitor 40 mg daily which will be continued at discharge.  #4 prophylaxis Lovenox for DVT prophylaxis.  Code Status: Full Family Communication: Updated patient. Disposition Plan: Home once stroke workup is completed.   Consultants:  Neurology: Dr. Nicole Kindred 04/19/2015  Procedures:  CT head 04/19/2015  Chest x-ray 04/19/2015  MRI MRA head 04/20/2015  Lower extremity Dopplers 04/20/2015 negative  Carotid Dopplers 04/20/2015 no significant ICA stenosis.  Antibiotics:  None  HPI/Subjective: Patient denies any left-sided weakness on numbness. Patient denies any dizziness. Patient states all his symptoms have resolved and is back to his baseline. No chest pain. No shortness of breath.  Objective: Filed Vitals:   04/21/15 0123 04/21/15 0702  BP: 103/42 110/50  Pulse: 48 54  Temp: 98.3 F (36.8 C) 98.4 F (36.9 C)  Resp: 18 18   No intake or output data in the 24 hours ending 04/21/15 1018 Filed Weights   04/19/15 1228  Weight: 82.611 kg (182 lb 2 oz)    Exam:   General:   NAD  Cardiovascular: RRR  Respiratory: CTAB  Abdomen: Soft, nontender, nondistended, positive bowel sounds.  Musculoskeletal: No clubbing cyanosis or edema.   Data Reviewed: Basic Metabolic Panel:  Recent Labs Lab 04/19/15 1235 04/19/15 1248 04/21/15 0923  NA 138 142 143  K 4.2 4.2 3.9  CL 103 103 106  CO2 25  --  25  GLUCOSE 103* 96 102*  BUN 15 19 17   CREATININE 1.59* 1.40* 1.48*  CALCIUM 9.4  --  9.2   Liver Function Tests:  Recent Labs Lab 04/19/15 1235  AST 29  ALT 25  ALKPHOS 57  BILITOT 0.8  PROT 7.0  ALBUMIN 4.4   No results for input(s): LIPASE, AMYLASE in the last 168 hours. No results for input(s): AMMONIA in the last 168 hours. CBC:  Recent Labs Lab 04/19/15 1235 04/19/15 1248  WBC 5.5  --   NEUTROABS 3.7  --   HGB 14.6 16.0  HCT 43.5 47.0  MCV 83.2  --   PLT 245  --    Cardiac Enzymes: No results for input(s): CKTOTAL, CKMB, CKMBINDEX, TROPONINI in the last 168 hours. BNP (last 3 results) No results for input(s): BNP in the last 8760 hours.  ProBNP (last 3 results) No results for input(s): PROBNP in the last 8760 hours.  CBG: No results for input(s): GLUCAP in the last 168 hours.  No results found for this or any previous visit (from the past 240 hour(s)).   Studies: Dg Chest 2 View  04/21/2015  CLINICAL DATA:  TIA; Woke this morning with numbness on left side, entire left arm and into face; facial droop, slurred speech and unsteady gait. No known heart or respiratory  problems; Non-smoker EXAM: CHEST  2 VIEW COMPARISON:  11/19/2014 FINDINGS: The heart size and mediastinal contours are within normal limits. Both lungs are clear. The visualized skeletal structures are unremarkable. IMPRESSION: No active cardiopulmonary disease. Electronically Signed   By: Van Clines M.D.   On: 04/21/2015 07:07   Ct Head Wo Contrast  04/19/2015  CLINICAL DATA:  Stroke symptoms, left-sided numbness, pressure behind left eye EXAM: CT HEAD WITHOUT  CONTRAST TECHNIQUE: Contiguous axial images were obtained from the base of the skull through the vertex without intravenous contrast. COMPARISON:  None. FINDINGS: No evidence of parenchymal hemorrhage or extra-axial fluid collection. No mass lesion, mass effect, or midline shift. No CT evidence of acute infarction. Cerebral volume is within normal limits.  No ventriculomegaly. The visualized paranasal sinuses are essentially clear. The mastoid air cells are unopacified. No evidence of calvarial fracture. IMPRESSION: Normal head CT. Electronically Signed   By: Julian Hy M.D.   On: 04/19/2015 13:26   Mr Brain Wo Contrast  04/20/2015  CLINICAL DATA:  TIA. Episode of transient left-sided numbness and weakness including the arm, trunk, and leg. Speech difficulty at the time. Duration 3-4 hours. EXAM: MRI HEAD WITHOUT CONTRAST MRA HEAD WITHOUT CONTRAST TECHNIQUE: Multiplanar, multiecho pulse sequences of the brain and surrounding structures were obtained without intravenous contrast. Angiographic images of the head were obtained using MRA technique without contrast. COMPARISON:  Head CT 04/19/2015 FINDINGS: MRI HEAD FINDINGS There is a small acute right MCA territory cortical infarct in the posterior frontal lobe which involves portions of the precentral and middle frontal gyri. There is no evidence of intracranial hemorrhage, mass, midline shift, or extra-axial fluid collection. Ventricles and sulci are normal in size for age. A small, chronic right parietal cortical infarct is noted. Orbits are unremarkable. A small right maxillary sinus mucous retention cyst is noted. The mastoid air cells are clear. Major intracranial vascular flow voids are preserved. MRA HEAD FINDINGS The visualized distal vertebral arteries are patent and somewhat small in caliber bilaterally. The left vertebral artery provides the dominant supply to the basilar, with the right vertebral artery being hypoplastic distal to the PICA  origin. Basilar artery is patent and somewhat small in caliber diffusely without focal stenosis. SCA origins are patent. There are fetal type origins of both PCAs. No significant proximal PCA stenosis is seen. The internal carotid arteries are patent from skullbase to carotid termini without stenosis. ACAs and MCAs are patent without evidence of major branch vessel occlusion or significant proximal stenosis. No intracranial aneurysm is identified. IMPRESSION: 1. Small acute right MCA territory frontal lobe cortical infarct. 2. Small chronic right parietal cortical infarct. 3. Normal variant head MRA without evidence of major branch occlusion or significant stenosis. Electronically Signed   By: Logan Bores M.D.   On: 04/20/2015 08:20   Mr Jodene Nam Head/brain Wo Cm  04/20/2015  CLINICAL DATA:  TIA. Episode of transient left-sided numbness and weakness including the arm, trunk, and leg. Speech difficulty at the time. Duration 3-4 hours. EXAM: MRI HEAD WITHOUT CONTRAST MRA HEAD WITHOUT CONTRAST TECHNIQUE: Multiplanar, multiecho pulse sequences of the brain and surrounding structures were obtained without intravenous contrast. Angiographic images of the head were obtained using MRA technique without contrast. COMPARISON:  Head CT 04/19/2015 FINDINGS: MRI HEAD FINDINGS There is a small acute right MCA territory cortical infarct in the posterior frontal lobe which involves portions of the precentral and middle frontal gyri. There is no evidence of intracranial hemorrhage, mass, midline shift, or extra-axial fluid  collection. Ventricles and sulci are normal in size for age. A small, chronic right parietal cortical infarct is noted. Orbits are unremarkable. A small right maxillary sinus mucous retention cyst is noted. The mastoid air cells are clear. Major intracranial vascular flow voids are preserved. MRA HEAD FINDINGS The visualized distal vertebral arteries are patent and somewhat small in caliber bilaterally. The left  vertebral artery provides the dominant supply to the basilar, with the right vertebral artery being hypoplastic distal to the PICA origin. Basilar artery is patent and somewhat small in caliber diffusely without focal stenosis. SCA origins are patent. There are fetal type origins of both PCAs. No significant proximal PCA stenosis is seen. The internal carotid arteries are patent from skullbase to carotid termini without stenosis. ACAs and MCAs are patent without evidence of major branch vessel occlusion or significant proximal stenosis. No intracranial aneurysm is identified. IMPRESSION: 1. Small acute right MCA territory frontal lobe cortical infarct. 2. Small chronic right parietal cortical infarct. 3. Normal variant head MRA without evidence of major branch occlusion or significant stenosis. Electronically Signed   By: Logan Bores M.D.   On: 04/20/2015 08:20    Scheduled Meds: . [MAR Hold] aspirin EC  325 mg Oral Daily  . [MAR Hold] atorvastatin  40 mg Oral q1800  . [MAR Hold] enoxaparin (LOVENOX) injection  40 mg Subcutaneous Q24H   Continuous Infusions: . sodium chloride      Principal Problem:   Acute CVA (cerebrovascular accident) ([place]) Active Problems:   Stroke ([place])   HLD (hyperlipidemia)    Time spent: 49 minutes    Evelina Lore M.D. [facility] Pager [phone]. If 7PM-7AM, please contact night-coverage at [email or web address], password [facility] 04/21/2015, 10:18 AM  LOS: 1 day

## 2015-04-21 NOTE — CV Procedure (Signed)
       PROCEDURE NOTE:  Procedure:  Transesophageal echocardiogram Operator:  Fransico Him, MD Indications:  CVA Complications: None  During this procedure the patient is administered a total of Versed 5 mg and Fentanyl 62.5 mg to achieve and maintain moderate conscious sedation.  The patient's heart rate, blood pressure, and oxygen saturation are monitored continuously during the procedure. The period of conscious sedation is 17 minutes, of which I was present face-to-face 100% of this time.  Results: Normal LV size and function Normal RV size and function Normal RA Normal LA Normal TV Normal PV with trivial PR Normal MV with trivial MR Normal trileaflet AV with trivial AR Normal interatrial septum by colorflow dopper but by agitated saline contrast there was evidence of microbubbles crossing the septum within 2 cardiac cycles c/w PFO with right to left shunt. Normal thoracic and ascending aorta.  The patient tolerated the procedure well and was transferred back to their room in stable condition.  Signed: Fransico Him, MD [facility]

## 2015-04-21 NOTE — Consult Note (Signed)
ELECTROPHYSIOLOGY CONSULT NOTE  Patient ID: Daniel Schneider MRN: [id number], DOB/AGE: [birth year]   Admit date: 04/19/2015 Date of Consult: 04/21/2015  Primary Physician: No PCP Per Patient Primary Cardiologist: None Reason for Consultation: Cryptogenic stroke ; recommendations regarding Implantable Loop Recorder  History of Present Illness Daniel Schneider was admitted on 04/19/2015 with CVA.  He has no known PMHx.  They first developed symptoms when he woke noting Left sided num,bness and weakness and difficulty with speech.  This slowly improved, did his morning routine with his kids and eventually came in to be evaluated.  Imaging demonstrated Non-dominant right MCA cortical infarct - embolic from an unknown source. 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  he has undergone workup for stroke pending echocardiogram/TEE this morning and carotid dopplers.  The patient has been monitored on telemetry which has demonstrated sinus rhythm with no arrhythmias.  Inpatient stroke work-up is to be completed with a TEE.    CUS - Bilateral: 1-39% ICA stenosis. Vertebral artery flow is antegrade. LE venous doppler - no DVT 2D echo - pending  Prior to admission, the patient denies chest pain, shortness of breath, dizziness, palpitations, or syncope. He denies any near syncope or syncope.    They are recovering from their stroke with plans to home at discharge.  EP has been asked to evaluate for placement of an implantable loop recorder to monitor for atrial fibrillation.     PMHx: None known to the patient  Surgical History: None  Prescriptions prior to admission  Medication Sig Dispense Refill Last Dose  . aspirin 325 MG tablet Take 650 mg by mouth daily as needed (for arm and back numbness).    04/19/2015 at Unknown time    Inpatient Medications:  . [MAR Hold] aspirin EC  325 mg Oral Daily  . [MAR Hold] atorvastatin  40 mg Oral q1800  . [MAR Hold]  enoxaparin (LOVENOX) injection  40 mg Subcutaneous Q24H    Allergies: No Known Allergies  Social History   Social History  . Marital Status: Married    Spouse Name: N/A  . Number of Children: N/A  . Years of Education: N/A   Occupational History  . Not on file.   Social History Main Topics  . Smoking status: Never Smoker   . Smokeless tobacco: Not on file  . Alcohol Use: No  . Drug Use: No  . Sexual Activity: Not on file   Other Topics Concern  . Not on file   Social History Narrative     Family History reviewed.  The patient denies any known medical conditions in his family, reports everyone as healthy    Review of Systems: All other systems reviewed and are otherwise negative except as noted above.  Physical Exam: Filed Vitals:   04/20/15 2244 04/21/15 0123 04/21/15 0702 04/21/15 1017  BP: 120/61 103/42 110/50 143/57  Pulse: 56 48 54 50  Temp: 98.1 F (36.7 C) 98.3 F (36.8 C) 98.4 F (36.9 C) 97.7 F (36.5 C)  TempSrc: Oral Oral Oral Oral  Resp: 18 18 18 10   Height:      Weight:      SpO2: 99% 100% 100% 100%    GEN- The patient is well appearing, alert and oriented x 3 today.   Head- normocephalic, atraumatic Eyes-  Sclera clear, conjunctiva pink Ears- hearing intact Oropharynx- clear Neck- supple Lungs- Clear to ausculation bilaterally, normal work of breathing Heart- Regular rate and rhythm, no murmurs,  rubs or gallops  GI- soft, NT, ND Extremities- no clubbing, cyanosis, or edema MS- no significant deformity or atrophy Skin- no rash or lesion Psych- euthymic mood, full affect   Labs:   Lab Results  Component Value Date   WBC 5.5 04/19/2015   HGB 16.0 04/19/2015   HCT 47.0 04/19/2015   MCV 83.2 04/19/2015   PLT 245 04/19/2015    Recent Labs Lab 04/19/15 1235  04/21/15 0923  NA 138  < > 143  K 4.2  < > 3.9  CL 103  < > 106  CO2 25  --  25  BUN 15  < > 17  CREATININE 1.59*  < > 1.48*  CALCIUM 9.4  --  9.2  PROT 7.0  --   --     BILITOT 0.8  --   --   ALKPHOS 57  --   --   ALT 25  --   --   AST 29  --   --   GLUCOSE 103*  < > 102*  < > = values in this interval not displayed. No results found for: CKTOTAL, CKMB, CKMBINDEX, TROPONINI Lab Results  Component Value Date   CHOL 185 04/20/2015   Lab Results  Component Value Date   HDL 37* 04/20/2015   Lab Results  Component Value Date   LDLCALC 136* 04/20/2015   Lab Results  Component Value Date   TRIG 59 04/20/2015   Lab Results  Component Value Date   CHOLHDL 5.0 04/20/2015   No results found for: LDLDIRECT  No results found for: DDIMER   Radiology/Studies:  Dg Chest 2 View 04/21/2015  CLINICAL DATA:  TIA; Woke this morning with numbness on left side, entire left arm and into face; facial droop, slurred speech and unsteady gait. No known heart or respiratory problems; Non-smoker EXAM: CHEST  2 VIEW COMPARISON:  11/19/2014 FINDINGS: The heart size and mediastinal contours are within normal limits. Both lungs are clear. The visualized skeletal structures are unremarkable. IMPRESSION: No active cardiopulmonary disease. Electronically Signed   By: Van Clines M.D.   On: 04/21/2015 07:07   Ct Head Wo Contrast 04/19/2015  CLINICAL DATA:  Stroke symptoms, left-sided numbness, pressure behind left eye EXAM: CT HEAD WITHOUT CONTRAST TECHNIQUE: Contiguous axial images were obtained from the base of the skull through the vertex without intravenous contrast. COMPARISON:  None. FINDINGS: No evidence of parenchymal hemorrhage or extra-axial fluid collection. No mass lesion, mass effect, or midline shift. No CT evidence of acute infarction. Cerebral volume is within normal limits.  No ventriculomegaly. The visualized paranasal sinuses are essentially clear. The mastoid air cells are unopacified. No evidence of calvarial fracture. IMPRESSION: Normal head CT. Electronically Signed   By: Julian Hy M.D.   On: 04/19/2015 13:26   Mr Jodene Nam Head/brain Wo Cm 04/20/2015   CLINICAL DATA:  TIA. Episode of transient left-sided numbness and weakness including the arm, trunk, and leg. Speech difficulty at the time. Duration 3-4 hours. EXAM: MRI HEAD WITHOUT CONTRAST MRA HEAD WITHOUT CONTRAST TECHNIQUE: Multiplanar, multiecho pulse sequences of the brain and surrounding structures were obtained without intravenous contrast. Angiographic images of the head were obtained using MRA technique without contrast. COMPARISON:  Head CT 04/19/2015 FINDINGS: MRI HEAD FINDINGS There is a small acute right MCA territory cortical infarct in the posterior frontal lobe which involves portions of the precentral and middle frontal gyri. There is no evidence of intracranial hemorrhage, mass, midline shift, or extra-axial fluid collection. Ventricles and  sulci are normal in size for age. A small, chronic right parietal cortical infarct is noted. Orbits are unremarkable. A small right maxillary sinus mucous retention cyst is noted. The mastoid air cells are clear. Major intracranial vascular flow voids are preserved. MRA HEAD FINDINGS The visualized distal vertebral arteries are patent and somewhat small in caliber bilaterally. The left vertebral artery provides the dominant supply to the basilar, with the right vertebral artery being hypoplastic distal to the PICA origin. Basilar artery is patent and somewhat small in caliber diffusely without focal stenosis. SCA origins are patent. There are fetal type origins of both PCAs. No significant proximal PCA stenosis is seen. The internal carotid arteries are patent from skullbase to carotid termini without stenosis. ACAs and MCAs are patent without evidence of major branch vessel occlusion or significant proximal stenosis. No intracranial aneurysm is identified. IMPRESSION: 1. Small acute right MCA territory frontal lobe cortical infarct. 2. Small chronic right parietal cortical infarct. 3. Normal variant head MRA without evidence of major branch occlusion or  significant stenosis. Electronically Signed   By: Logan Bores M.D.   On: 04/20/2015 08:20    12-lead ECG SR All prior EKG's in EPIC reviewed with no documented atrial fibrillation  Telemetry SB, HR appears generally 40's-50's, episodes of 2nd degree AVblock type I noted  Assessment and Plan:  1. 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  The patient has a TEE planned for this AM.  I spoke at length with the patient about monitoring for afib with either a 30 day event monitor or an implantable loop recorder.  Risks, benefits, and alteratives to implantable loop recorder were discussed with the patient today.   At this time, the patient is very clear in that he is too uncomfortable about having anything implanted in him and prefers to start with the event monitor.  Arrangements will be made as well as follow-up.   2. SB, [place]     Asymptomatic       No hx of dizzy spells, near syncope or syncope.   Please call with questions.   Renee Dyane Dustman, PA-C 04/21/2015   Histroy reveiwed and pt examined Discussions with family andwith patient and implications of monitoring   His concern was the scar He is willing to undergo ILR and will plan tomorrow if discharge on schedule His TEE showed PFO, but no DVT.    2AVB1 asymptomatic and no indication to do anything about this currently  QRS narrow Will need to clarify also with neuro in am

## 2015-04-21 NOTE — Progress Notes (Signed)
[facility] PROGRESS NOTE  Daniel Schneider [id number] DOB: 1975/11/21 DOA: 04/19/2015 PCP: No PCP Per Patient  Assessment/Plan: #1 acute nondominant right MCA cortical infarct Consent for embolic CVA. 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 Clinical improvement. Patient states at baseline. No dysarthric speech. Weakness and numbness on the left side have resolved. Carotid Dopplers with no significant ICA stenosis. Lower extremity Dopplers negative. 2-D echo pending. LDL of 136 goal LDL less than 70. UDS negative. Hemoglobin A1c is 5.8. Hypercoagulable panel pending. Continue aspirin for secondary stroke prevention. Patient for TEE and potential loop recorder today to evaluate for cardioembolic source. Patient has been started on Lipitor.  #2 blood pressure Stable.  #3 hyperlipidemia LDL of 136. Goal LDL less than 70. Patient has been started on Lipitor 40 mg daily which will be continued at discharge.  #4 prophylaxis Lovenox for DVT prophylaxis.  Code Status: Full Family Communication: Updated patient. Disposition Plan: Home once stroke workup is completed.   Consultants:  Neurology: Dr. Nicole Kindred 04/19/2015  Procedures:  CT head 04/19/2015  Chest x-ray 04/19/2015  MRI MRA head 04/20/2015  Lower extremity Dopplers 04/20/2015 negative  Carotid Dopplers 04/20/2015 no significant ICA stenosis.  Antibiotics:  None  HPI/Subjective: Patient denies any left-sided weakness on numbness. Patient denies any dizziness. Patient states all his symptoms have resolved and is back to his baseline. No chest pain. No shortness of breath.  Objective: Filed Vitals:   04/21/15 0123 04/21/15 0702  BP: 103/42 110/50  Pulse: 48 54  Temp: 98.3 F (36.8 C) 98.4 F (36.9 C)  Resp: 18 18   No intake or output data in the 24 hours ending 04/21/15 1018 Filed Weights   04/19/15 1228  Weight: 82.611 kg (182 lb 2 oz)    Exam:   General:   NAD  Cardiovascular: RRR  Respiratory: CTAB  Abdomen: Soft, nontender, nondistended, positive bowel sounds.  Musculoskeletal: No clubbing cyanosis or edema.   Data Reviewed: Basic Metabolic Panel:  Recent Labs Lab 04/19/15 1235 04/19/15 1248 04/21/15 0923  NA 138 142 143  K 4.2 4.2 3.9  CL 103 103 106  CO2 25  --  25  GLUCOSE 103* 96 102*  BUN 15 19 17   CREATININE 1.59* 1.40* 1.48*  CALCIUM 9.4  --  9.2   Liver Function Tests:  Recent Labs Lab 04/19/15 1235  AST 29  ALT 25  ALKPHOS 57  BILITOT 0.8  PROT 7.0  ALBUMIN 4.4   No results for input(s): LIPASE, AMYLASE in the last 168 hours. No results for input(s): AMMONIA in the last 168 hours. CBC:  Recent Labs Lab 04/19/15 1235 04/19/15 1248  WBC 5.5  --   NEUTROABS 3.7  --   HGB 14.6 16.0  HCT 43.5 47.0  MCV 83.2  --   PLT 245  --    Cardiac Enzymes: No results for input(s): CKTOTAL, CKMB, CKMBINDEX, TROPONINI in the last 168 hours. BNP (last 3 results) No results for input(s): BNP in the last 8760 hours.  ProBNP (last 3 results) No results for input(s): PROBNP in the last 8760 hours.  CBG: No results for input(s): GLUCAP in the last 168 hours.  No results found for this or any previous visit (from the past 240 hour(s)).   Studies: Dg Chest 2 View  04/21/2015  CLINICAL DATA:  TIA; Woke this morning with numbness on left side, entire left arm and into face; facial droop, slurred speech and unsteady gait. No known heart or respiratory  problems; Non-smoker EXAM: CHEST  2 VIEW COMPARISON:  11/19/2014 FINDINGS: The heart size and mediastinal contours are within normal limits. Both lungs are clear. The visualized skeletal structures are unremarkable. IMPRESSION: No active cardiopulmonary disease. Electronically Signed   By: Van Clines M.D.   On: 04/21/2015 07:07   Ct Head Wo Contrast  04/19/2015  CLINICAL DATA:  Stroke symptoms, left-sided numbness, pressure behind left eye EXAM: CT HEAD WITHOUT  CONTRAST TECHNIQUE: Contiguous axial images were obtained from the base of the skull through the vertex without intravenous contrast. COMPARISON:  None. FINDINGS: No evidence of parenchymal hemorrhage or extra-axial fluid collection. No mass lesion, mass effect, or midline shift. No CT evidence of acute infarction. Cerebral volume is within normal limits.  No ventriculomegaly. The visualized paranasal sinuses are essentially clear. The mastoid air cells are unopacified. No evidence of calvarial fracture. IMPRESSION: Normal head CT. Electronically Signed   By: Julian Hy M.D.   On: 04/19/2015 13:26   Mr Brain Wo Contrast  04/20/2015  CLINICAL DATA:  TIA. Episode of transient left-sided numbness and weakness including the arm, trunk, and leg. Speech difficulty at the time. Duration 3-4 hours. EXAM: MRI HEAD WITHOUT CONTRAST MRA HEAD WITHOUT CONTRAST TECHNIQUE: Multiplanar, multiecho pulse sequences of the brain and surrounding structures were obtained without intravenous contrast. Angiographic images of the head were obtained using MRA technique without contrast. COMPARISON:  Head CT 04/19/2015 FINDINGS: MRI HEAD FINDINGS There is a small acute right MCA territory cortical infarct in the posterior frontal lobe which involves portions of the precentral and middle frontal gyri. There is no evidence of intracranial hemorrhage, mass, midline shift, or extra-axial fluid collection. Ventricles and sulci are normal in size for age. A small, chronic right parietal cortical infarct is noted. Orbits are unremarkable. A small right maxillary sinus mucous retention cyst is noted. The mastoid air cells are clear. Major intracranial vascular flow voids are preserved. MRA HEAD FINDINGS The visualized distal vertebral arteries are patent and somewhat small in caliber bilaterally. The left vertebral artery provides the dominant supply to the basilar, with the right vertebral artery being hypoplastic distal to the PICA  origin. Basilar artery is patent and somewhat small in caliber diffusely without focal stenosis. SCA origins are patent. There are fetal type origins of both PCAs. No significant proximal PCA stenosis is seen. The internal carotid arteries are patent from skullbase to carotid termini without stenosis. ACAs and MCAs are patent without evidence of major branch vessel occlusion or significant proximal stenosis. No intracranial aneurysm is identified. IMPRESSION: 1. Small acute right MCA territory frontal lobe cortical infarct. 2. Small chronic right parietal cortical infarct. 3. Normal variant head MRA without evidence of major branch occlusion or significant stenosis. Electronically Signed   By: Logan Bores M.D.   On: 04/20/2015 08:20   Mr Jodene Nam Head/brain Wo Cm  04/20/2015  CLINICAL DATA:  TIA. Episode of transient left-sided numbness and weakness including the arm, trunk, and leg. Speech difficulty at the time. Duration 3-4 hours. EXAM: MRI HEAD WITHOUT CONTRAST MRA HEAD WITHOUT CONTRAST TECHNIQUE: Multiplanar, multiecho pulse sequences of the brain and surrounding structures were obtained without intravenous contrast. Angiographic images of the head were obtained using MRA technique without contrast. COMPARISON:  Head CT 04/19/2015 FINDINGS: MRI HEAD FINDINGS There is a small acute right MCA territory cortical infarct in the posterior frontal lobe which involves portions of the precentral and middle frontal gyri. There is no evidence of intracranial hemorrhage, mass, midline shift, or extra-axial fluid  collection. Ventricles and sulci are normal in size for age. A small, chronic right parietal cortical infarct is noted. Orbits are unremarkable. A small right maxillary sinus mucous retention cyst is noted. The mastoid air cells are clear. Major intracranial vascular flow voids are preserved. MRA HEAD FINDINGS The visualized distal vertebral arteries are patent and somewhat small in caliber bilaterally. The left  vertebral artery provides the dominant supply to the basilar, with the right vertebral artery being hypoplastic distal to the PICA origin. Basilar artery is patent and somewhat small in caliber diffusely without focal stenosis. SCA origins are patent. There are fetal type origins of both PCAs. No significant proximal PCA stenosis is seen. The internal carotid arteries are patent from skullbase to carotid termini without stenosis. ACAs and MCAs are patent without evidence of major branch vessel occlusion or significant proximal stenosis. No intracranial aneurysm is identified. IMPRESSION: 1. Small acute right MCA territory frontal lobe cortical infarct. 2. Small chronic right parietal cortical infarct. 3. Normal variant head MRA without evidence of major branch occlusion or significant stenosis. Electronically Signed   By: Logan Bores M.D.   On: 04/20/2015 08:20    Scheduled Meds: . [MAR Hold] aspirin EC  325 mg Oral Daily  . [MAR Hold] atorvastatin  40 mg Oral q1800  . [MAR Hold] enoxaparin (LOVENOX) injection  40 mg Subcutaneous Q24H   Continuous Infusions: . sodium chloride      Principal Problem:   Acute CVA (cerebrovascular accident) ([place]) Active Problems:   Stroke ([place])   HLD (hyperlipidemia)    Time spent: 110 minutes    Jayma Volpi M.D. [facility] Pager [phone]. If 7PM-7AM, please contact night-coverage at [email or web address], password [email or web address] 04/21/2015, 10:18 AM  LOS: 1 day

## 2015-04-21 NOTE — Progress Notes (Signed)
Echocardiogram 2D Echocardiogram limited has been performed.  Daniel Schneider 04/21/2015, 4:12 PM

## 2015-04-21 NOTE — Progress Notes (Signed)
  Patient is scheduled for TEE give recent TIA. I explained purpose as well as procedural details including potential associated risk. All questions and concerns were address. The patient agrees to proceed. RN will obtain official written consent. TEE orders have been placed. He has been NPO since midnight.   Kynzi Levay, Silas Flood 04/21/2015

## 2015-04-21 NOTE — Care Management Note (Signed)
Case Management Note  Patient Details  Name: Daniel Schneider MRN: [id number] Date of Birth: 03-10-1975  Subjective/Objective:                    Action/Plan: Patient was admitted with CVA. Will follow for discharge needs pending PT/OT evals and physician orders. Expected Discharge Date:                  Expected Discharge Plan:     In-House Referral:     Discharge planning Services     Post Acute Care Choice:    Choice offered to:     DME Arranged:    DME Agency:     HH Arranged:    HH Agency:     Status of Service:  In process, will continue to follow  Medicare Important Message Given:    Date Medicare IM Given:    Medicare IM give by:    Date Additional Medicare IM Given:    Additional Medicare Important Message give by:     If discussed at [place] of Stay Meetings, dates discussed:    Additional Comments:  Rolm Baptise, RN 04/21/2015, 3:40 PM [phone]

## 2015-04-21 NOTE — Interval H&P Note (Signed)
History and Physical Interval Note:  04/21/2015 10:44 AM  Daniel Schneider  has presented today for surgery, with the diagnosis of stroke  The various methods of treatment have been discussed with the patient and family. After consideration of risks, benefits and other options for treatment, the patient has consented to  Procedure(s): TRANSESOPHAGEAL ECHOCARDIOGRAM (TEE) (N/A) as a surgical intervention .  The patient's history has been reviewed, patient examined, no change in status, stable for surgery.  I have reviewed the patient's chart and labs.  Questions were answered to the patient's satisfaction.     [name] R

## 2015-04-21 NOTE — Progress Notes (Signed)
STROKE TEAM PROGRESS NOTE   HISTORY OF PRESENT ILLNESS Daniel Schneider is an 40 y.o. male with no known medical disorder presenting following an episode of transient weakness and numbness involving his left side, including's arm and trunk and leg. He had difficulty with speech at same time. Interim onset was about 7 AM and lasted about 3-4 hours. He has no previous history of stroke or TIA. CT scan of his head showed no acute intracranial abnormality. NIH stroke score at the time of this evaluation was 1 for slight numbness involving left extremities compared to right extremities  LSN: 10 AM on 04/19/2015 tPA Given: No: Minimal residual symptoms mRankin:   SUBJECTIVE (INTERVAL HISTORY) The patient's father is present. Pt still drowsy after TEE. Explained to father and pt about the TEE result and the needs to do TCD bubble study.   OBJECTIVE Temp:  [97.7 F (36.5 C)-99 F (37.2 C)] 97.7 F (36.5 C) (03/13 1017) Pulse Rate:  [43-67] 43 (03/13 1240) Cardiac Rhythm:  [-] Sinus bradycardia;Heart block (03/12 1907) Resp:  [10-18] 12 (03/13 1240) BP: (103-149)/(42-81) 119/58 mmHg (03/13 1240) SpO2:  [95 %-100 %] 99 % (03/13 1240)  CBC:   Recent Labs Lab 04/19/15 1235 04/19/15 1248  WBC 5.5  --   NEUTROABS 3.7  --   HGB 14.6 16.0  HCT 43.5 47.0  MCV 83.2  --   PLT 245  --     Basic Metabolic Panel:   Recent Labs Lab 04/19/15 1235 04/19/15 1248 04/21/15 0923  NA 138 142 143  K 4.2 4.2 3.9  CL 103 103 106  CO2 25  --  25  GLUCOSE 103* 96 102*  BUN 15 19 17   CREATININE 1.59* 1.40* 1.48*  CALCIUM 9.4  --  9.2    Lipid Panel:     Component Value Date/Time   CHOL 185 04/20/2015 0531   TRIG 59 04/20/2015 0531   HDL 37* 04/20/2015 0531   CHOLHDL 5.0 04/20/2015 0531   VLDL 12 04/20/2015 0531   LDLCALC 136* 04/20/2015 0531   HgbA1c:  Lab Results  Component Value Date   HGBA1C 5.8* 04/20/2015   Urine Drug Screen:     Component Value Date/Time   LABOPIA NONE  DETECTED 04/20/2015 1703   COCAINSCRNUR NONE DETECTED 04/20/2015 1703   LABBENZ NONE DETECTED 04/20/2015 1703   AMPHETMU NONE DETECTED 04/20/2015 1703   THCU NONE DETECTED 04/20/2015 1703   LABBARB NONE DETECTED 04/20/2015 1703      IMAGING I have personally reviewed the radiological images below and agree with the radiology interpretations.  Ct Head Wo Contrast 04/19/2015   Normal head CT.   MRI / MRA HEAD 04/20/2015 1. Small acute right MCA territory frontal lobe cortical infarct. 2. Small chronic right parietal cortical infarct. 3. Normal variant head MRA without evidence of major branch occlusion or significant stenosis.  CUS - Bilateral: 1-39% ICA stenosis. Vertebral artery flow is antegrade.  LE venous doppler - no DVT  TTE - Left ventricle: The cavity size was normal. Wall thickness was  normal. Systolic function was normal. The estimated ejection  fraction was in the range of 50% to 55%. Wall motion was normal;  there were no regional wall motion abnormalities. Left  ventricular diastolic function parameters were normal. Impressions: - No cardiac source of emboli was indentified.  TEE - Normal LV size and function Normal RV size and function Normal RA Normal LA Normal TV Normal PV with trivial PR Normal MV with trivial  MR Normal trileaflet AV with trivial AR Normal interatrial septum by colorflow dopper but by agitated saline contrast there was evidence of microbubbles crossing the septum within 2 cardiac cycles c/w PFO with right to left shunt. Normal thoracic and ascending aorta.  TCD bubble study - pending   PHYSICAL EXAM  Temp:  [97.7 F (36.5 C)-99 F (37.2 C)] 97.7 F (36.5 C) (03/13 1017) Pulse Rate:  [43-67] 43 (03/13 1240) Resp:  [10-18] 12 (03/13 1240) BP: (103-149)/(42-81) 119/58 mmHg (03/13 1240) SpO2:  [95 %-100 %] 99 % (03/13 1240)  General - Well nourished, well developed, drowsy after TEE.  Ophthalmologic - Sharp disc margins OU.    Cardiovascular - Regular rate and rhythm with no murmur.  Mental Status -  Drowsy after TEE Language including expression, naming, repetition, comprehension was assessed and found intact. Fund of Knowledge was assessed and was intact.  Cranial Nerves II - XII - II - Visual field intact OU. III, IV, VI - Extraocular movements intact. V - Facial sensation intact bilaterally. VII - Facial movement intact bilaterally. VIII - Hearing & vestibular intact bilaterally. X - Palate elevates symmetrically. XI - Chin turning & shoulder shrug intact bilaterally. XII - Tongue protrusion intact.  Motor Strength - The patient's strength was normal in all extremities and pronator drift was absent.  Bulk was normal and fasciculations were absent.   Motor Tone - Muscle tone was assessed at the neck and appendages and was normal.  Reflexes - The patient's reflexes were 1+ in all extremities and he had no pathological reflexes.  Sensory - Light touch, temperature/pinprick were assessed and were symmetrical.    Coordination - The patient had normal movements in the hands and feet with no ataxia or dysmetria.  Tremor was absent.  Gait and Station - The patient's transfers, posture, gait, station, and turns were observed as normal.   ASSESSMENT/PLAN [name] is a 40 y.o. male with no significant PMH who presented with numbness involving his left arm and face and speech difficulties.  He did not receive IV t-PA due to minimal deficits.  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   Resultant resolution of deficit  MRI - Small acute right MCA territory cortical infarct. Small chronic right parietal cortical infarct.  MRA unremarkable  Carotid Doppler - unremarkable  TEE negative except PFO  TCD bubble study - pending  LE venous Dopplers - negative for  DVT  hyprecoagulable / autoimmune - pending  LDL - 136  HgbA1c 5.8  UDS - negative  VTE prophylaxis - Lovenox  No antithrombotic prior to admission, now on aspirin 325 mg daily.   Patient counseled to be compliant with his antithrombotic medications  Ongoing aggressive stroke risk factor management  BP management  Stable - although somewhat low at times BP goal normotensive now  Hyperlipidemia  Home meds:  No lipid lowering medications prior to admission  LDL 136, goal < 70  on Lipitor 40 mg daily  Continue statin at discharge  Other Stroke Risk Factors  Hx stroke/TIA - by MRI  Pt denies smoking, alcohol or illicit drugs  Pt denies OSA s/s  Pt denies weight loss or malignancy s/s  ? migraine  Hospital day # 1  Rosalin Hawking, MD PhD Stroke Neurology 04/21/2015 2:09 PM     To contact Stroke Continuity provider, please refer to [email or web address] After hours, contact General Neurology

## 2015-04-22 ENCOUNTER — Inpatient Hospital Stay (HOSPITAL_COMMUNITY): Payer: BLUE CROSS/BLUE SHIELD

## 2015-04-22 DIAGNOSIS — I639 Cerebral infarction, unspecified: Secondary | ICD-10-CM

## 2015-04-22 DIAGNOSIS — Q211 Atrial septal defect: Secondary | ICD-10-CM

## 2015-04-22 LAB — BASIC METABOLIC PANEL
ANION GAP: 10 (ref 5–15)
BUN: 18 mg/dL (ref 6–20)
CO2: 27 mmol/L (ref 22–32)
Calcium: 9.2 mg/dL (ref 8.9–10.3)
Chloride: 106 mmol/L (ref 101–111)
Creatinine, Ser: 1.38 mg/dL — ABNORMAL HIGH (ref 0.61–1.24)
GFR calc Af Amer: >60 mL/min (ref >60)
GFR calc non Af Amer: >60 mL/min (ref >60)
GLUCOSE: 108 mg/dL — AB (ref 65–99)
POTASSIUM: 4 mmol/L (ref 3.5–5.1)
Sodium: 143 mmol/L (ref 135–145)

## 2015-04-22 LAB — LUPUS ANTICOAGULANT PANEL
DRVVT: 42.8 s (ref 0.0–44.0)
PTT LA: 36.8 s (ref 0.0–43.6)

## 2015-04-22 LAB — CARDIOLIPIN ANTIBODIES, IGG, IGM, IGA
Anticardiolipin IgA: <9 APL U/mL (ref 0–11)
Anticardiolipin IgG: <9 GPL U/mL (ref 0–14)

## 2015-04-22 LAB — PROTEIN S, TOTAL: Protein S Ag, Total: 117 % (ref 60–150)

## 2015-04-22 LAB — BETA-2-GLYCOPROTEIN I ABS, IGG/M/A: Beta-2-Glycoprotein I IgA: <9 GPI IgA units (ref 0–25)

## 2015-04-22 LAB — PROTEIN C ACTIVITY: Protein C Activity: 110 % (ref 73–180)

## 2015-04-22 LAB — PROTEIN S ACTIVITY: Protein S Activity: 109 % (ref 63–140)

## 2015-04-22 MED ORDER — ATORVASTATIN CALCIUM 40 MG PO TABS: 40.0000 mg | ORAL_TABLET | Freq: Every day | ORAL | Status: DC

## 2015-04-22 NOTE — Discharge Summary (Signed)
Physician Discharge Summary  Daniel Schneider [id number] DOB: [birth year] DOA: 04/19/2015  PCP: No PCP Daniel Patient  Admit date: 04/19/2015 Discharge date: 04/22/2015  Time spent: 65 minutes  Recommendations for Outpatient Follow-up:  1. Follow up with Daniel Schneider, neurology in 2 months 2. Follow up with PCP in 1-2 weeks.On follow up patients hyperlipidemia and CVA will need to be followed up. CMET on follow up.    Discharge Diagnoses:  Principal Problem:   Acute CVA (cerebrovascular accident) ([place]) Active Problems:   Stroke ([place])   HLD (hyperlipidemia)   PFO (patent foramen ovale)   Discharge Condition: stable and improved.  Diet recommendation: HEART HEALTHY  Filed Weights   04/19/15 1228  Weight: 82.611 kg (182 lb 2 oz)    History of present illness:  Daniel Schneider is a 40 y.o. male  With no PMHx. He was in good health until the night prior to admission, when he developed a cramp in his right leg. This passed and he was able to go to sleep.On the morning of admission,  when he woke up, he had complete left sided numbness- face, arm and side of torso to waist. This was around 9 AM. He was unable to call for help due to his slurred speech. All symptoms resolved around 11 AM. No headache. No fever, no chills.   In the ER, labs unremarkable except his CR which is normally elevated. CT scan was normal. ER spoke with Daniel. Nicole Schneider who asked for hospitalists to observe for TIA work up.     Hospital Course:  #1 acute nondominant right MCA cortical infarct Patient was admitted with concerns for TIA versus acute CVA. Head CT which was done was normal. MRI of the head which was done was consistent with an acute right MCA cortical infarction with small chronic right parietal cortical infarct. MRA without evidence of major branch occlusion or significant stenosis. Patient subsequently underwent stroke workup. Concern for embolic CVA. 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 Neurology was consulted and followed the patient throughout auscultation. Weakness and numbness on the left side have resolved. Carotid Dopplers with no significant ICA stenosis. Lower extremity Dopplers negative. 2-D echo with no source of emboli. LDL of 136 goal LDL less than 70. UDS negative. Hemoglobin A1c is 5.8. Hypercoagulable panel pending. Patient was placed on a statin and aspirin for secondary stroke prevention. Patient underwent TEE on 04/21/2015 with no source of emboli, however did have a normal intra-arterial septum color flow but by agitated saline contrast those evidence of microbubbles crossing the septum within 2 cardiac cycles consistent with PFO with right-to-left shunt. Patient subsequently underwent a TCD bubble study that showed at rest PFO [name] degree II, with valsalva spencer degree III. It was recommended that patient get an outpatient 30 day monitor to look for arrhythmias. Patient is also referred to Daniel. Burt Knack of cardiology for second opinion regarding PFO closure. Patient will follow-up with neurology 2 months postdischarge.  #2 blood pressure Stable.  #3 hyperlipidemia LDL of 136. Goal LDL less than 70. Patient has been started on Lipitor 40 mg daily which will be continued at discharge.   Procedures:  CT head 04/19/2015  Chest x-ray 04/19/2015  MRI MRA head 04/20/2015  Lower extremity Dopplers 04/20/2015 negative  Carotid Dopplers 04/20/2015 no significant ICA stenosis.  Transesophageal echocardiogram 03/24/2015 Daniel. Fransico Schneider  2 D echo 04/21/2015  transcranial Doppler bubble study Daniel Schneider 04/22/2015  Consultations:  Neurology: Daniel. Nicole Schneider 04/19/2015  Cardiologist: Daniel Schneider 04/21/2015  Discharge Exam: Filed Vitals:   04/22/15 0609 04/22/15 1022  BP: 119/55 116/65  Pulse: 49 57  Temp: 97.8 F  (36.6 C)   Resp: 18 18    General: NAD Cardiovascular: RRR Respiratory: CTAB  Discharge Instructions   Discharge Instructions    Ambulatory referral to Neurology    Complete by:  As directed   Daniel. Erlinda Schneider requests followup in 2 months     Diet - low sodium heart healthy    Complete by:  As directed      Discharge instructions    Complete by:  As directed   Follow up a PCP in 1-2 weeks. Follow up with Daniel Schneider, neurology in 2 month.     Increase activity slowly    Complete by:  As directed           Current Discharge Medication List    START taking these medications   Details  atorvastatin (LIPITOR) 40 MG tablet Take 1 tablet (40 mg total) by mouth daily at 6 PM. Qty: 30 tablet, Refills: 3      CONTINUE these medications which have NOT CHANGED   Details  aspirin 325 MG tablet Take 650 mg by mouth daily as needed (for arm and back numbness).        No Known Allergies Follow-up Information    Follow up with [facility].   Specialty:  Cardiology   Why:  A scheduler will call to arrange the event (heart) monitor with you   Contact information:   [address], [place] [phone]      Follow up with Daniel Berthold, NP On 06/12/2015.   Specialty:  Cardiology   Why:  10:00AM   Contact information:   [address] Daniel [place] [address] [phone]       Follow up with Xu,Jindong, MD In 2 months.   Specialty:  Neurology   Why:  Stroke Clinic, Office will call you with appointment date & time   Contact information:   [address] [place] [address] [phone]       Follow up with Daniel Schneider On 05/26/2015.   [id number]. Please arrive 15 min early for paperwork. Please bring a picture ID and your current medications to the appt.    Contact information:   [place] [phone]        The results of significant diagnostics  from this hospitalization (including imaging, microbiology, ancillary and laboratory) are listed below for reference.    Significant Diagnostic Studies: Dg Chest 2 View  04/21/2015  CLINICAL DATA:  TIA; Woke this morning with numbness on left side, entire left arm and into face; facial droop, slurred speech and unsteady gait. No known heart or respiratory problems; Non-smoker EXAM: CHEST  2 VIEW COMPARISON:  11/19/2014 FINDINGS: The heart size and mediastinal contours are within normal limits. Both lungs are clear. The visualized skeletal structures are unremarkable. IMPRESSION: No active cardiopulmonary disease. Electronically Signed   By: Van Clines M.D.   On: 04/21/2015 07:07   Ct Head Wo Contrast  04/19/2015  CLINICAL DATA:  Stroke symptoms, left-sided numbness, pressure behind left eye EXAM: CT HEAD WITHOUT CONTRAST TECHNIQUE: Contiguous axial images were obtained from the base of the skull  through the vertex without intravenous contrast. COMPARISON:  None. FINDINGS: No evidence of parenchymal hemorrhage or extra-axial fluid collection. No mass lesion, mass effect, or midline shift. No CT evidence of acute infarction. Cerebral volume is within normal limits.  No ventriculomegaly. The visualized paranasal sinuses are essentially clear. The mastoid air cells are unopacified. No evidence of calvarial fracture. IMPRESSION: Normal head CT. Electronically Signed   By: Julian Hy M.D.   On: 04/19/2015 13:26   Mr Brain Wo Contrast  04/20/2015  CLINICAL DATA:  TIA. Episode of transient left-sided numbness and weakness including the arm, trunk, and leg. Speech difficulty at the time. Duration 3-4 hours. EXAM: MRI HEAD WITHOUT CONTRAST MRA HEAD WITHOUT CONTRAST TECHNIQUE: Multiplanar, multiecho pulse sequences of the brain and surrounding structures were obtained without intravenous contrast. Angiographic images of the head were obtained using MRA technique without contrast. COMPARISON:  Head CT  04/19/2015 FINDINGS: MRI HEAD FINDINGS There is a small acute right MCA territory cortical infarct in the posterior frontal lobe which involves portions of the precentral and middle frontal gyri. There is no evidence of intracranial hemorrhage, mass, midline shift, or extra-axial fluid collection. Ventricles and sulci are normal in size for age. A small, chronic right parietal cortical infarct is noted. Orbits are unremarkable. A small right maxillary sinus mucous retention cyst is noted. The mastoid air cells are clear. Major intracranial vascular flow voids are preserved. MRA HEAD FINDINGS The visualized distal vertebral arteries are patent and somewhat small in caliber bilaterally. The left vertebral artery provides the dominant supply to the basilar, with the right vertebral artery being hypoplastic distal to the PICA origin. Basilar artery is patent and somewhat small in caliber diffusely without focal stenosis. SCA origins are patent. There are fetal type origins of both PCAs. No significant proximal PCA stenosis is seen. The internal carotid arteries are patent from skullbase to carotid termini without stenosis. ACAs and MCAs are patent without evidence of major branch vessel occlusion or significant proximal stenosis. No intracranial aneurysm is identified. IMPRESSION: 1. Small acute right MCA territory frontal lobe cortical infarct. 2. Small chronic right parietal cortical infarct. 3. Normal variant head MRA without evidence of major branch occlusion or significant stenosis. Electronically Signed   By: Logan Bores M.D.   On: 04/20/2015 08:20   Mr Jodene Nam Head/brain Wo Cm  04/20/2015  CLINICAL DATA:  TIA. Episode of transient left-sided numbness and weakness including the arm, trunk, and leg. Speech difficulty at the time. Duration 3-4 hours. EXAM: MRI HEAD WITHOUT CONTRAST MRA HEAD WITHOUT CONTRAST TECHNIQUE: Multiplanar, multiecho pulse sequences of the brain and surrounding structures were obtained  without intravenous contrast. Angiographic images of the head were obtained using MRA technique without contrast. COMPARISON:  Head CT 04/19/2015 FINDINGS: MRI HEAD FINDINGS There is a small acute right MCA territory cortical infarct in the posterior frontal lobe which involves portions of the precentral and middle frontal gyri. There is no evidence of intracranial hemorrhage, mass, midline shift, or extra-axial fluid collection. Ventricles and sulci are normal in size for age. A small, chronic right parietal cortical infarct is noted. Orbits are unremarkable. A small right maxillary sinus mucous retention cyst is noted. The mastoid air cells are clear. Major intracranial vascular flow voids are preserved. MRA HEAD FINDINGS The visualized distal vertebral arteries are patent and somewhat small in caliber bilaterally. The left vertebral artery provides the dominant supply to the basilar, with the right vertebral artery being hypoplastic distal to the PICA origin. Basilar artery is  patent and somewhat small in caliber diffusely without focal stenosis. SCA origins are patent. There are fetal type origins of both PCAs. No significant proximal PCA stenosis is seen. The internal carotid arteries are patent from skullbase to carotid termini without stenosis. ACAs and MCAs are patent without evidence of major branch vessel occlusion or significant proximal stenosis. No intracranial aneurysm is identified. IMPRESSION: 1. Small acute right MCA territory frontal lobe cortical infarct. 2. Small chronic right parietal cortical infarct. 3. Normal variant head MRA without evidence of major branch occlusion or significant stenosis. Electronically Signed   By: Logan Bores M.D.   On: 04/20/2015 08:20    Microbiology: No results found for this or any previous visit (from the past 240 hour(s)).   Labs: Basic Metabolic Panel:  Recent Labs Lab 04/19/15 1235 04/19/15 1248 04/21/15 0923 04/22/15 0544  NA 138 142 143 143  K  4.2 4.2 3.9 4.0  CL 103 103 106 106  CO2 25  --  25 27  GLUCOSE 103* 96 102* 108*  BUN 15 19 17 18   CREATININE 1.59* 1.40* 1.48* 1.38*  CALCIUM 9.4  --  9.2 9.2   Liver Function Tests:  Recent Labs Lab 04/19/15 1235  AST 29  ALT 25  ALKPHOS 57  BILITOT 0.8  PROT 7.0  ALBUMIN 4.4   No results for input(s): LIPASE, AMYLASE in the last 168 hours. No results for input(s): AMMONIA in the last 168 hours. CBC:  Recent Labs Lab 04/19/15 1235 04/19/15 1248  WBC 5.5  --   NEUTROABS 3.7  --   HGB 14.6 16.0  HCT 43.5 47.0  MCV 83.2  --   PLT 245  --    Cardiac Enzymes: No results for input(s): CKTOTAL, CKMB, CKMBINDEX, TROPONINI in the last 168 hours. BNP: BNP (last 3 results) No results for input(s): BNP in the last 8760 hours.  ProBNP (last 3 results) No results for input(s): PROBNP in the last 8760 hours.  CBG: No results for input(s): GLUCAP in the last 168 hours.     SignedIrine Seal MD.  [facility] 04/22/2015, 4:44 PM

## 2015-04-22 NOTE — Care Management Note (Signed)
Case Management Note  Patient Details  Name: Daniel Schneider MRN: [id number] Date of Birth: 1975/09/19  Subjective/Objective:                    Action/Plan: Plan is for patient to discharge home with self care. Pt does not have a PCP. Pt given the [facility] number in order for him to obtain a PCP. Pt also set up with the [facility] so he has a primary care visit in the interim per patient request. Appointment made with the [facility] who is seeing overflow for the [facility]. Appointment given to the patient and placed on the AVS. Will update the bedside RN.   Expected Discharge Date:                  Expected Discharge Plan:  Home/Self Care  In-House Referral:     Discharge planning Services  CM Consult  Post Acute Care Choice:    Choice offered to:     DME Arranged:    DME Agency:     HH Arranged:    [place] Agency:     Status of Service:  Completed, signed off  Medicare Important Message Given:    Date Medicare IM Given:    Medicare IM give by:    Date Additional Medicare IM Given:    Additional Medicare Important Message give by:     If discussed at [place] of Stay Meetings, dates discussed:    Additional Comments:  Pollie Friar, RN 04/22/2015, 4:37 PM

## 2015-04-22 NOTE — Progress Notes (Signed)
I have spoken with Dr Erlinda Hong who feels that in this very young patient that PFO is more likely the cause for stroke.  Dr Erlinda Hong does not feel that we should proceed with implantable loop recorder at this time.  He will further evaluate PFO with TCD and consider referral for PFO closure.  Given patients reluctance to have the device placed, I think that this is reasonable.  Electrophysiology team to see as needed while here. Please call with questions.  Thompson Grayer MD, [facility] 04/22/2015 10:47 AM

## 2015-04-22 NOTE — Progress Notes (Signed)
STROKE TEAM PROGRESS NOTE   SUBJECTIVE (INTERVAL HISTORY) The patient's family at the bedside. He has had a PFO on TEE. No complaints. Pending TCD bubble study.   OBJECTIVE Temp:  [97.6 F (36.4 C)-98 F (36.7 C)] 97.8 F (36.6 C) (03/14 0609) Pulse Rate:  [43-57] 57 (03/14 1022) Cardiac Rhythm:  [-] Normal sinus rhythm (03/14 0800) Resp:  [13-18] 18 (03/14 1022) BP: (103-133)/(37-72) 116/65 mmHg (03/14 1022) SpO2:  [96 %-100 %] 100 % (03/14 1022)  CBC:   Recent Labs Lab 04/19/15 1235 04/19/15 1248  WBC 5.5  --   NEUTROABS 3.7  --   HGB 14.6 16.0  HCT 43.5 47.0  MCV 83.2  --   PLT 245  --     Basic Metabolic Panel:   Recent Labs Lab 04/21/15 0923 04/22/15 0544  NA 143 143  K 3.9 4.0  CL 106 106  CO2 25 27  GLUCOSE 102* 108*  BUN 17 18  CREATININE 1.48* 1.38*  CALCIUM 9.2 9.2    Lipid Panel:     Component Value Date/Time   CHOL 185 04/20/2015 0531   TRIG 59 04/20/2015 0531   HDL 37* 04/20/2015 0531   CHOLHDL 5.0 04/20/2015 0531   VLDL 12 04/20/2015 0531   LDLCALC 136* 04/20/2015 0531   HgbA1c:  Lab Results  Component Value Date   HGBA1C 5.8* 04/20/2015   Urine Drug Screen:     Component Value Date/Time   LABOPIA NONE DETECTED 04/20/2015 1703   COCAINSCRNUR NONE DETECTED 04/20/2015 1703   LABBENZ NONE DETECTED 04/20/2015 1703   AMPHETMU NONE DETECTED 04/20/2015 1703   THCU NONE DETECTED 04/20/2015 1703   LABBARB NONE DETECTED 04/20/2015 1703      IMAGING I have personally reviewed the radiological images below and agree with the radiology interpretations.  Ct Head Wo Contrast 04/19/2015   Normal head CT.   MRI / MRA HEAD 04/20/2015 1. Small acute right MCA territory frontal lobe cortical infarct. 2. Small chronic right parietal cortical infarct. 3. Normal variant head MRA without evidence of major branch occlusion or significant stenosis.  CUS - Bilateral: 1-39% ICA stenosis. Vertebral artery flow is antegrade.  LE venous doppler -  no DVT  TTE  - Left ventricle: The cavity size was normal. Wall thickness was normal. Systolic function was normal. The estimated ejection fraction was in the range of 50% to 55%. Wall motion was normal; there were no regional wall motion abnormalities. Left ventricular diastolic function parameters were normal. Mpressions:  No cardiac source of emboli was indentified.  TEE - Normal LV size and function Normal RV size and function Normal RA Normal LA Normal TV Normal PV with trivial PR Normal MV with trivial MR Normal trileaflet AV with trivial AR Normal interatrial septum by colorflow dopper but by agitated saline contrast there was evidence of microbubbles crossing the septum within 2 cardiac cycles c/w PFO with right to left shunt. Normal thoracic and ascending aorta.  TCD bubble study - at rest PFO spencer degree II, with valsalva spencer degree III   PHYSICAL EXAM Temp:  [97.6 F (36.4 C)-98 F (36.7 C)] 97.8 F (36.6 C) (03/14 0609) Pulse Rate:  [43-57] 57 (03/14 1022) Resp:  [13-18] 18 (03/14 1022) BP: (103-133)/(37-72) 116/65 mmHg (03/14 1022) SpO2:  [96 %-100 %] 100 % (03/14 1022)  General - Well nourished, well developed, awake and alert.   Ophthalmologic - Sharp disc margins OU.   Cardiovascular - Regular rate and rhythm with no murmur.  Mental Status -  Alert and oriented 3 Language including expression, naming, repetition, comprehension was assessed and found intact. Fund of Knowledge was assessed and was intact.  Cranial Nerves II - XII - II - Visual field intact OU. III, IV, VI - Extraocular movements intact. V - Facial sensation intact bilaterally. VII - Facial movement intact bilaterally. VIII - Hearing & vestibular intact bilaterally. X - Palate elevates symmetrically. XI - Chin turning & shoulder shrug intact bilaterally. XII - Tongue protrusion intact.  Motor Strength - The patient's strength was normal in all extremities and pronator drift was  absent.  Bulk was normal and fasciculations were absent.   Motor Tone - Muscle tone was assessed at the neck and appendages and was normal.  Reflexes - The patient's reflexes were 1+ in all extremities and he had no pathological reflexes.  Sensory - Light touch, temperature/pinprick were assessed and were symmetrical.    Coordination - The patient had normal movements in the hands and feet with no ataxia or dysmetria.  Tremor was absent.  Gait and Station - The patient's transfers, posture, gait, station, and turns were observed as normal.   ASSESSMENT/PLAN [name] is a 40 y.o. male with no significant PMH who presented with numbness involving his left arm and face and speech difficulties.  He did not receive IV t-PA due to minimal deficits.  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   Resultant resolution of deficit  MRI - Small acute right MCA territory cortical infarct. Small chronic right parietal cortical infarct.  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   Patient counseled to be compliant with his antithrombotic medications  Ongoing aggressive stroke risk factor management  Outpatient 30 day monitoring to look for arrhythmia. No loop at this time.   Follow-up with Dr. Erlinda Hong in 2 months, stroke clinic. Order placed  PFO  Shown on TEE  TCD bubble study - [name] at rest, [name] with valsalva  Works as house remodeling, frequent valsalva maneuver at work (but not sedentary), have previous cortical stroke, young age, lack of other stroke risk  factors, and pt is interested in PFO closure, I will refer him to Dr. Burt Knack for second opinion.   BP management  Stable   Hyperlipidemia  Home meds:  No lipid lowering medications prior to admission  LDL 136, goal < 70  on Lipitor 40 mg daily  Continue statin at discharge  Other Stroke Risk Factors  Hx stroke/TIA - by MRI  Pt denies smoking, alcohol or illicit drugs  Pt denies OSA s/s  Pt denies weight loss or malignancy s/s  ? migraine  Hospital day # 2  Neurology will sign off. Please call with questions. Pt will follow up with Dr. Erlinda Hong at [facility] in about 2 months. Thanks for the consult.  Rosalin Hawking, MD PhD Stroke Neurology 04/22/2015 3:44 PM    To contact Stroke Continuity provider, please refer to [email or web address] After hours, contact General Neurology

## 2015-04-22 NOTE — Progress Notes (Signed)
Transcranial Doppler Bubble study has been completed. Dr. Erlinda Hong performed procedure. Verbal consent taken and risks/ benefits explained. The right middle cerebral artery was insonated. The IV in right forearm was used.  HITS heard at rest: [name] HITS heard during Valsalva: [name]  PFO size: small    Landry Mellow, RDMS, RVT 04/22/2015

## 2015-04-22 NOTE — Progress Notes (Signed)
Pt for discharge home today. Discharge orders received. IV and telemetry dcd with dressing clean dry and intact to lower back. Discharge instructions and prescriptions given with verbalized understanding. Family at bedside to assist with discharge. Staff brought patient to lobby via wheelchair at this time. Transported to home by family member.

## 2015-04-22 NOTE — Procedures (Signed)
[facility]  [address] [place] [address].  [phone]   TRANSCRANIAL DOPPLER BUBBLE STUDY  Daniel Schneider  Date of Birth: 1975/05/17 Medical Record Number: [id number] Indications: stroke Date of Procedure: 04/22/2015 Clinical History: stroke in young, old stroke in MRI Technical Description: Transcranial Doppler Bubble Study was performed at the bedside after taking written informed consent from the patient and explaining risk/benefits. The right middle cerebral artery was insonated using a hand held probe. And IV line had been previously inserted in the right forearm by the RN using aseptic precautions. Agitated saline injection at rest resulted in a few HITS and after valsalva maneuver resulted in more high intensity transient signals (HITS).  Impression: Positive Transcranial Doppler Bubble Study at rest with spencer degree II shunt and with valsalva spencer degree III shunt indicative of a small right to left intracardiac.  Results were explained to the patient. Questions were answered. I had a long discussion with the patient with regards to the role of patent foramen ovale and risk of stroke. It is unclear at the present time whether PFO closure leads to better secondary stroke prevention or not. However, he does have previous cortical stroke, young age, active work, frequent valsalva maneuver at work, lack of other stroke risk factors, he is interested to close the PFO, I will refer him to Dr. Burt Knack for second opinion. Pt is in agreement.  Will also do 30 day cardiac event monitoring as outpt.  Rosalin Hawking, MD PhD Stroke Neurology 04/22/2015 3:26 PM

## 2015-04-24 ENCOUNTER — Other Ambulatory Visit: Payer: Self-pay | Admitting: Physician Assistant

## 2015-04-24 DIAGNOSIS — Q211 Atrial septal defect: Secondary | ICD-10-CM

## 2015-04-24 DIAGNOSIS — I4891 Unspecified atrial fibrillation: Secondary | ICD-10-CM

## 2015-04-24 DIAGNOSIS — I639 Cerebral infarction, unspecified: Secondary | ICD-10-CM

## 2015-04-24 DIAGNOSIS — Q2112 Patent foramen ovale: Secondary | ICD-10-CM

## 2015-04-24 LAB — FACTOR 5 LEIDEN

## 2015-04-24 LAB — ALPHA GALACTOSIDASE: Alpha galactosidase, serum: 55.9 nmol/hr/mg prt (ref 28.0–80.0)

## 2015-04-25 ENCOUNTER — Telehealth: Payer: Self-pay | Admitting: Neurology

## 2015-04-25 ENCOUNTER — Other Ambulatory Visit: Payer: Self-pay | Admitting: *Deleted

## 2015-04-25 LAB — PROTHROMBIN GENE MUTATION

## 2015-04-25 NOTE — Telephone Encounter (Signed)
Called pt to schedule his 2 mo hosp f/u and patient stated that he is having pain in his knee and his hand feels cold and wants to come in before 2 mo and would like to talk with the nurse or Doctor.

## 2015-04-26 NOTE — Telephone Encounter (Signed)
I just discharged him from hospital. If I have slot, I can see him in one month after discharge. He will see Dr. Burt Knack on 05/16/15. I can see him after that.   For his keen pain and hand coldness, he should contact his PCP for further management. Thanks.   Rosalin Hawking, MD PhD Stroke Neurology 04/26/2015 11:40 AM

## 2015-04-28 ENCOUNTER — Ambulatory Visit (INDEPENDENT_AMBULATORY_CARE_PROVIDER_SITE_OTHER): Payer: BLUE CROSS/BLUE SHIELD

## 2015-04-28 DIAGNOSIS — Q211 Atrial septal defect: Secondary | ICD-10-CM

## 2015-04-28 DIAGNOSIS — I639 Cerebral infarction, unspecified: Secondary | ICD-10-CM | POA: Diagnosis not present

## 2015-04-28 DIAGNOSIS — Q2112 Patent foramen ovale: Secondary | ICD-10-CM

## 2015-04-28 DIAGNOSIS — I4891 Unspecified atrial fibrillation: Secondary | ICD-10-CM | POA: Diagnosis not present

## 2015-04-28 NOTE — Patient Outreach (Signed)
[facility]) Care Management  04/28/2015  Daniel Schneider 09/29/1975 [id number]   Received EMMI-Stroke referral with Dashboard red on follow up appointment not scheduled.   Dashboard addressed by calling patient on 04/25/2015.  Patient voiced HIPPA compliance.  Patient voices that he did not have a primary care doctor prior to hospitalization. States he plans to get established with MD that he follows up with. States he has appointment with MD that he was set up with in hospital. It is noted that patient has follow up appointment scheduled 05/26/15. Patient was advised to call MD office to request earlier appointment for hospital follow up if possible. Patient voices understanding and plans to call. States he has appointment with heart MD 04/28/2015. States he has transportation to MD appointments.   Patient voiced that he was admitted to hospital  from 3/11-3/14 with stroke diagnosis/. States he was having left sided numbness of face, arm and side. States also had slurred speech. Voices that all of symptoms went away while he was in hospital. States he is walking without assistance and having no difficulty with self care. States currently lives alone but has some one to call on if he needs to. States he was able to get prescriptions filled and is taking medications as ordered by MD.   Dashboard addressed. Patient voices understanding.  Stroke symptoms and action plan  reviewed with patient. Patient voices understanding and plans to call 911 if symptoms occur.   Plan: Send to care management assistant to close out case. Daniel Daisy, RN BSN [place] Management Coordinator [facility] Care Management  [phone]

## 2015-04-28 NOTE — Telephone Encounter (Signed)
Rn call patient back scheduling an appt for hospital follow up. Rn stated pr Dr Erlinda Hong note he would have to see his PCP for pain in knee and coldness. Pt stated he is working on getting a PCP. Rn stated he was just discharge last week and wants to be seen sooner. Rn stated typically if he was just discharge hospital follow up are 4 to 6 weeks because they have to see other MDs. Rn schedule patient and told him to check in at 0830 am and bring insurance card and copayment. Pt stated how much was the co payment, Rn stated he would need to call his insurance company to find out the co payment. Pt verbalized understanding. Pt was at the cardiologist office getting evaluate for a heart monitor.

## 2015-05-06 ENCOUNTER — Encounter: Payer: Self-pay | Admitting: Cardiovascular Disease

## 2015-05-06 ENCOUNTER — Encounter: Payer: Self-pay | Admitting: *Deleted

## 2015-05-06 ENCOUNTER — Ambulatory Visit (INDEPENDENT_AMBULATORY_CARE_PROVIDER_SITE_OTHER): Payer: BLUE CROSS/BLUE SHIELD | Admitting: Cardiovascular Disease

## 2015-05-06 VITALS — BP 106/70 | HR 69 | Ht 66.0 in | Wt 184.1 lb

## 2015-05-06 DIAGNOSIS — Z01812 Encounter for preprocedural laboratory examination: Secondary | ICD-10-CM

## 2015-05-06 LAB — CBC
HEMATOCRIT: 42.6 % (ref 39.0–52.0)
HEMOGLOBIN: 14.7 g/dL (ref 13.0–17.0)
MCH: 29.3 pg (ref 26.0–34.0)
MCHC: 34.5 g/dL (ref 30.0–36.0)
MCV: 84.9 fL (ref 78.0–100.0)
MPV: 8.4 fL — AB (ref 8.6–12.4)
Platelets: 284 10*3/uL (ref 150–400)
RBC: 5.02 MIL/uL (ref 4.22–5.81)
RDW: 14.8 % (ref 11.5–15.5)
WBC: 6.1 10*3/uL (ref 4.0–10.5)

## 2015-05-06 LAB — BASIC METABOLIC PANEL
BUN: 13 mg/dL (ref 7–25)
CHLORIDE: 105 mmol/L (ref 98–110)
CO2: 29 mmol/L (ref 20–31)
CREATININE: 1.27 mg/dL (ref 0.60–1.35)
Calcium: 9.3 mg/dL (ref 8.6–10.3)
Glucose, Bld: 59 mg/dL — ABNORMAL LOW (ref 65–99)
POTASSIUM: 4.5 mmol/L (ref 3.5–5.3)
Sodium: 142 mmol/L (ref 135–146)

## 2015-05-06 LAB — PROTIME-INR
INR: 1.04 (ref <1.50)
Prothrombin Time: 13.7 seconds (ref 11.6–15.2)

## 2015-05-06 MED ORDER — CLOPIDOGREL BISULFATE 75 MG PO TABS: 75.0000 mg | ORAL_TABLET | Freq: Every day | ORAL | Status: DC

## 2015-05-06 NOTE — Patient Instructions (Signed)
Your physician has recommended you make the following change in your medication:  1.) start clopidigrel (Plavix) 75 mg once daily--START THIS MEDICATION ON Sunday 05/11/15  Your physician recommends that you return for lab work TODAY (CBC, BMET, INR/PT)  [place] FOR 05/15/15.

## 2015-05-06 NOTE — Progress Notes (Signed)
Cardiology Office Note Date:  05/06/2015   ID:  Daniel Schneider, DOB 09/25/1975, MRN [id number]  PCP:  No PCP Per Patient  Cardiologist:  None Referring: Dr Erlinda Hong   Chief Complaint  Patient presents with  . Numbness   History of Present Illness: Daniel Schneider is a 40 y.o. male who presents for evaluation of PFO and cryptogenic stroke. He is healthy without any chronic medical problems and presented acutely on 04/19/2015 with left sided numbness and slurred speech. MRI showed a right MCA-territory infarct and a chronic small parietal infarct. TEE and transcranial doppler are positive for PFO. The patient presents for consideration of transcatheter closure.   He has had a good recovery. Complaining of leg cramps and thinks these are medication-related. Tolerating aspirin without problems. Today, he denies symptoms of palpitations, chest pain, shortness of breath, orthopnea, PND, lower extremity edema, dizziness, or syncope. No further neurologic symptoms except for headache.   The patient is physically active. He doesn't smoke or drink alcohol. 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 He hasn't had any chronic medical problems and specifically denies HTN, diabetes, or any hospitalizations.   No past medical history on file.  Past Surgical History  Procedure Laterality Date  . Tee without cardioversion N/A 04/21/2015    Procedure: TRANSESOPHAGEAL ECHOCARDIOGRAM (TEE);  Surgeon: Sueanne Margarita, MD;  Location: [facility] ENDOSCOPY;  Service: Cardiovascular;  Laterality: N/A;    Current Outpatient Prescriptions  Medication Sig Dispense Refill  . aspirin 325 MG tablet Take 650 mg by mouth daily as needed (for arm and back numbness).     Marland Kitchen atorvastatin (LIPITOR) 40 MG tablet Take 1 tablet (40 mg total) by mouth daily at 6 PM. 30 tablet 3  . [START ON 05/11/2015] clopidogrel (PLAVIX) 75 MG tablet Take 1 tablet (75 mg total) by mouth daily. 90  tablet 3   No current facility-administered medications for this visit.    Allergies:   Review of patient's allergies indicates no known allergies.   Social History:  The patient  reports that he has never smoked. He has never used smokeless tobacco. He reports that he does not drink alcohol or use illicit drugs.   Family History:  The patient's family history includes Gout in his paternal grandfather; Healthy in his father and mother; Heart disease in his paternal grandfather; Sickle cell anemia in his maternal grandmother.    ROS:  Please see the history of present illness.  All other systems are reviewed and negative.   PHYSICAL EXAM: VS:  BP 106/70 mmHg  Pulse 69  Ht 5\' 6"  (1.676 m)  Wt 184 lb 1.9 oz (83.516 kg)  BMI 29.73 kg/m2 , BMI Body mass index is 29.73 kg/(m^2). GEN: Well nourished, well developed, physically-fit appearing African American man in no acute distress HEENT: normal Neck: no JVD, no masses. No carotid bruits Cardiac: RRR without murmur or gallop                Respiratory:  clear to auscultation bilaterally, normal work of breathing GI: soft, nontender, nondistended, + BS MS: no deformity or atrophy Ext: no pretibial edema, pedal pulses 2+= bilaterally Skin: warm and dry, no rash Neuro:  Strength and sensation are intact Psych: euthymic mood, full affect  EKG:  EKG is not ordered today.  Recent Labs: 04/19/2015: ALT 25; Hemoglobin 16.0; Platelets 245 04/22/2015: BUN 18; Creatinine, Ser 1.38*; Potassium 4.0; Sodium 143   Lipid Panel  Component Value Date/Time   CHOL 185 04/20/2015 0531   TRIG 59 04/20/2015 0531   HDL 37* 04/20/2015 0531   CHOLHDL 5.0 04/20/2015 0531   VLDL 12 04/20/2015 0531   LDLCALC 136* 04/20/2015 0531      Wt Readings from Last 3 Encounters:  05/06/15 184 lb 1.9 oz (83.516 kg)  04/19/15 182 lb 2 oz (82.611 kg)     Cardiac Studies Reviewed: TEE: Study Conclusions  - Left ventricle: Systolic function was normal.  Wall motion was  normal; there were no regional wall motion abnormalities. - Aortic valve: There was trivial regurgitation. - Left atrium: No evidence of thrombus in the atrial cavity or  appendage. - Right atrium: No evidence of thrombus in the atrial cavity or  appendage. - Atrial septum: No evidence of PFO by colorflow doppler but  agitated saline contrast study positive for microbubbles in the  LA after 2 cardiac cycles c/w PFO with right to left shunting.  There was a patent foramen ovale.  Transcranial Doppler: Summary:  - Positive TCD Bubble study indicative of a small right to left  intracradiac shunt at rest and post valsalva manouvre. - Dr. Erlinda Hong performed procedure.  Verbal content taken and risks/ benefits explained.  The right middle cerebral artery was insonated.  The IV in right forearm was used.   HITS heard at rest: [name]  HITS heard during Valsalva: [name]   PFO size: small.  MRI Brain: IMPRESSION: 1. Small acute right MCA territory frontal lobe cortical infarct. 2. Small chronic right parietal cortical infarct. 3. Normal variant head MRA without evidence of major branch occlusion or significant stenosis.  ASSESSMENT AND PLAN: PFO with cryptogenic stroke: I have personally reviewed the patient's TEE images and he does have a positive bubble study. Bubbles cross into the left heart within 3 cardiac cycles which is highly suggestive of an intracardiac shunt. His evaluation has also included a TCD bubble study which was suggestive of intracardiac shunt. We reviewed available clinical trial data regarding transcatheter PFO closure. This patient falls into the category of cryptogenic stroke and has a paucity of any other risk factors for stroke. His MRI showed an acute infarct and a chronic infarct, suggestive of recurrent embolic events. It seems reasonable to consider PFO closure in this circumstance. Based on my review of the TEE images, I  am not [id number] certain his PFO is large enough for closure, but I think Intracardiac echo at the time of planned closure will better define the septal anatomy. I suspect the PFO will be anatomically suitable for closure. He will be started on dual antiplatelet Rx with ASA and plavix prior to the procedure.  I reviewed the risks, indications, and alternatives to transcatheter closure with the patient. Specific risks include bleeding, infection, device embolization, stroke, cardiac perforation, tamponade, arrhythmia, MI, and late device erosion. He understands these serious risks occur at low incidence of < 1%. Post-procedural instructions/limitations were reviewed with the patient.   Current medicines are reviewed with the patient today.  The patient does not have concerns regarding medicines.  Labs/ tests ordered today include:   Orders Placed This Encounter  Procedures  . Basic metabolic panel  . CBC  . Protime-INR   Disposition:   Plan transcatheter PFO closure   Signed, Sherren Mocha, MD  05/06/2015 5:42 PM    [facility] [address], [place], [place]  [address] Phone: [phone]; Fax: [phone]

## 2015-05-10 DIAGNOSIS — I639 Cerebral infarction, unspecified: Secondary | ICD-10-CM

## 2015-05-10 HISTORY — DX: Cerebral infarction, unspecified: I63.9

## 2015-05-13 ENCOUNTER — Other Ambulatory Visit: Payer: Self-pay | Admitting: Cardiovascular Disease

## 2015-05-13 ENCOUNTER — Telehealth: Payer: Self-pay | Admitting: Cardiovascular Disease

## 2015-05-13 DIAGNOSIS — Q211 Atrial septal defect: Secondary | ICD-10-CM

## 2015-05-13 DIAGNOSIS — Q2112 Patent foramen ovale: Secondary | ICD-10-CM

## 2015-05-13 MED ORDER — DEXTROSE 5 % IV SOLN: 2.0000 g | Freq: Once | INTRAVENOUS | Status: DC

## 2015-05-13 NOTE — Telephone Encounter (Signed)
Lennette Bihari from [facility] called to report that at 2AM this morning patient had a 3.3 second pause and was running SB at 40 bpm. He spoke to the patient today. He reports the patient was sleeping at that time and he has been asymptomatic all day. He reports he had the patient send in a follow-up strip which showed NSR in the 70s with no ectopy.  He is sending both strips for review.

## 2015-05-14 NOTE — Telephone Encounter (Signed)
Follow up     Pt states that his surgery has been cancelled. Should he still need to take his medication recently prescribed?

## 2015-05-14 NOTE — Telephone Encounter (Signed)
I spoke with the pt and made him aware that he can continue plavix at this time since his PFO closure was rescheduled to 05/27/15 or he can stop the medication and restart it on 05/22/15.  I reviewed pre procedure instructions with the pt and he will have his mom pick up instructions from our front desk.

## 2015-05-16 ENCOUNTER — Ambulatory Visit: Payer: No Typology Code available for payment source | Admitting: Cardiovascular Disease

## 2015-05-16 NOTE — Telephone Encounter (Signed)
Dr Burt Knack aware of pause and no changes recommended.

## 2015-05-25 ENCOUNTER — Telehealth: Payer: Self-pay | Admitting: Physician Assistant

## 2015-05-25 ENCOUNTER — Telehealth: Payer: Self-pay | Admitting: Internal Medicine

## 2015-05-25 NOTE — Telephone Encounter (Signed)
Called [facility], because patient had an episode of >3 s pause on the monitor. [facility] was unable to contact the patient. It was told to them that if they contact the patient, let him know to come to ED to get evaluated.

## 2015-05-25 NOTE — Telephone Encounter (Signed)
Paged by answering service, the patient returning call of Dr. Eula Fried. Spoken with [facility] representative, the patient had a 3 sec pause at 2:38am with rhythm of type 1 2nd degree. Will fax strip to clinic. The patient was sleeping at that time. He is asymptomatic. Advice to go to ER if syncope or severe dizziness. Otherwise, keep schedule surgery.    Daniel Schneider, [place]

## 2015-05-26 ENCOUNTER — Encounter: Payer: Self-pay | Admitting: Family Medicine

## 2015-05-26 ENCOUNTER — Ambulatory Visit (INDEPENDENT_AMBULATORY_CARE_PROVIDER_SITE_OTHER): Payer: BLUE CROSS/BLUE SHIELD | Admitting: Family Medicine

## 2015-05-26 VITALS — BP 110/76 | HR 64 | Temp 98.6°F | Ht 68.0 in | Wt 184.5 lb

## 2015-05-26 DIAGNOSIS — D573 Sickle-cell trait: Secondary | ICD-10-CM | POA: Diagnosis not present

## 2015-05-26 DIAGNOSIS — Z23 Encounter for immunization: Secondary | ICD-10-CM

## 2015-05-26 DIAGNOSIS — R7303 Prediabetes: Secondary | ICD-10-CM | POA: Diagnosis not present

## 2015-05-26 DIAGNOSIS — Z8673 Personal history of transient ischemic attack (TIA), and cerebral infarction without residual deficits: Secondary | ICD-10-CM

## 2015-05-26 NOTE — Progress Notes (Signed)
Subjective:    Patient ID: Daniel Schneider, male    DOB: March 03, 1975, 40 y.o.   MRN: [id number]  HPI  Daniel Schneider, a 40 year old male presents to establish care. He states that he has been healthy and has not had a primary provider. Patient was recently hospitalized on 04/19/2015 after presenting to hospital with a left sided numbness, transient weakness,  and slurred speech that quickly resolved. Daniel Schneider has no previously history of hypertension, stroke, or TIA.  CT scan of his head showed no acute intracranial abnormality. MRI/MRA of head showed a small acute right MCA territory frontal lobe cortical infarct and small chronic right parietal cortical infarct. Daniel Schneider is followed by Dr. Sherren Mocha, cardiologist and had a recent TEE with a positive bubble study and loop recorder to evaluate cardioembolic source. He alsoo has a intracardiac echo study on 44/18/2017 for further evaluation. He was discharged on Plavix and statin therapy. He states that he has been taking medications consistently. Daniel Schneider currently denies headache, dizziness, weakness, fatigue, paresthesias, dysuria, nausea, vomiting, or diarrhea.   Past Medical History  Diagnosis Date  . Stroke [facility])    Past Surgical History  Procedure Laterality Date  . Tee without cardioversion N/A 04/21/2015    Procedure: TRANSESOPHAGEAL ECHOCARDIOGRAM (TEE);  Surgeon: Sueanne Margarita, MD;  Location: [facility] ENDOSCOPY;  Service: Cardiovascular;  Laterality: N/A;   Immunization History  Administered Date(s) Administered  . Tdap 05/26/2015   Social History   Social History  . Marital Status: Legally Separated    Spouse Name: N/A  . Number of Children: 3  . Years of Education: College   Occupational History  .  Other    Self-[employer]   Social History Main Topics  . Smoking status: Never Smoker   . Smokeless tobacco: Never Used  . Alcohol Use: No  . Drug Use: No  . Sexual Activity: Not on file   Other  Topics Concern  . Not on file   Social History Narrative   Patient lives at home with family.   Caffeine Use: occasionally   Review of Systems  Constitutional: Negative.  Negative for fever and unexpected weight change.  HENT: Negative.   Eyes: Negative.  Negative for photophobia, discharge and visual disturbance.  Respiratory: Negative.  Negative for shortness of breath, wheezing and stridor.   Cardiovascular: Negative.  Negative for chest pain, palpitations and leg swelling.  Gastrointestinal: Negative.   Endocrine: Negative for heat intolerance, polydipsia, polyphagia and polyuria.  Genitourinary: Negative.   Musculoskeletal: Negative.   Skin: Negative.  Negative for color change, pallor, rash and wound.  Allergic/Immunologic: Negative.  Negative for immunocompromised state.  Neurological: Negative.  Negative for dizziness, tremors, seizures, syncope, facial asymmetry, speech difficulty, weakness, light-headedness, numbness and headaches.  Hematological: Negative.   Psychiatric/Behavioral: Negative.  Negative for suicidal ideas and sleep disturbance.      Objective:   Physical Exam  Constitutional: He is oriented to person, place, and time. He appears well-developed and well-nourished.  HENT:  Head: Normocephalic and atraumatic.  Right Ear: External ear normal.  Left Ear: External ear normal.  Nose: Nose normal.  Mouth/Throat: Oropharynx is clear and moist.  Eyes: Conjunctivae and EOM are normal. Pupils are equal, round, and reactive to light.  Neck: Normal range of motion. Neck supple.  Cardiovascular: Normal rate, regular rhythm, normal heart sounds and intact distal pulses.   Pulmonary/Chest: Effort normal and breath sounds normal.  Abdominal: Soft. Bowel sounds are normal.  Musculoskeletal: Normal range of motion.  Neurological: He is alert and oriented to person, place, and time. He has normal strength and normal reflexes. No cranial nerve deficit or sensory deficit. He  displays a negative Romberg sign. Gait normal.  Reflex Scores:      Tricep reflexes are 2+ on the right side and 2+ on the left side.      Bicep reflexes are 2+ on the right side and 2+ on the left side.      Brachioradialis reflexes are 2+ on the right side and 2+ on the left side.      Patellar reflexes are 2+ on the right side and 2+ on the left side.      Achilles reflexes are 2+ on the right side and 2+ on the left side. Skin: Skin is warm and dry.  Psychiatric: He has a normal mood and affect. His behavior is normal. Judgment and thought content normal.      BP 110/76 mmHg  Pulse 64  Temp(Src) 98.6 F (37 C) (Oral)  Ht 5\' 8"  (1.727 m)  Wt 184 lb 8 oz (83.689 kg)  BMI 28.06 kg/m2  SpO2 100% Assessment & Plan:  1. History of CVA (cerebrovascular accident) Daniel Schneider is to continue Plavix 75 mg daily and statin therapy as previously prescribed. He is to follow-up with cardiology as scheduled. He is requesting to return to work. Patient may return to work with cardiology clearance. Discussed stroke risks at length.   2. Sickle cell trait [facility]) Daniel Schneider had a positive sickledex test while hospitalized. He is aware that he has sickle cell trait.    3. Prediabetes Recommend a lowfat, low carbohydrate diet divided over 5-6 small meals, increase water intake to 6-8 glasses, and 150 minutes per week of cardiovascular exercise. Will recheck hemoglobin a1C in 6 months.   4. Need for Tdap vaccination - Tdap vaccine greater than or equal to 7yo IM   Routine Health Maintenance:  The patient is asked to make an attempt to improve diet and exercise patterns to aid in medical management of this problem. Recommend digital rectal exam with hemmocult cards    The patient was given clear instructions to go to ER or return to medical center if symptoms do not improve, worsen or new problems develop. The patient verbalized understanding.   RTC: 6 months for prediabetes or as needed.  Follow up with specialist as scheduled   Dorena Dew, FNP

## 2015-05-27 ENCOUNTER — Ambulatory Visit (HOSPITAL_COMMUNITY)
Admission: RE | Admit: 2015-05-27 | Discharge: 2015-05-28 | Disposition: A | Discharge status: Home / Self Care | Payer: BLUE CROSS/BLUE SHIELD | Source: Ambulatory Visit | Attending: Cardiovascular Disease | Admitting: Cardiovascular Disease

## 2015-05-27 ENCOUNTER — Encounter (HOSPITAL_COMMUNITY): Payer: Self-pay | Admitting: General Practice

## 2015-05-27 ENCOUNTER — Encounter (HOSPITAL_COMMUNITY)
Admission: RE | Disposition: A | Discharge status: Home / Self Care | Payer: Self-pay | Source: Ambulatory Visit | Attending: Cardiovascular Disease

## 2015-05-27 DIAGNOSIS — Z7982 Long term (current) use of aspirin: Secondary | ICD-10-CM | POA: Diagnosis not present

## 2015-05-27 DIAGNOSIS — R7303 Prediabetes: Secondary | ICD-10-CM | POA: Insufficient documentation

## 2015-05-27 DIAGNOSIS — Z7902 Long term (current) use of antithrombotics/antiplatelets: Secondary | ICD-10-CM | POA: Diagnosis not present

## 2015-05-27 DIAGNOSIS — Q2112 Patent foramen ovale: Secondary | ICD-10-CM

## 2015-05-27 DIAGNOSIS — E785 Hyperlipidemia, unspecified: Secondary | ICD-10-CM | POA: Insufficient documentation

## 2015-05-27 DIAGNOSIS — Q211 Atrial septal defect: Secondary | ICD-10-CM | POA: Diagnosis present

## 2015-05-27 HISTORY — DX: Atrial septal defect: Q21.1

## 2015-05-27 HISTORY — DX: Patent foramen ovale: Q21.12

## 2015-05-27 HISTORY — PX: ASD AND VSD REPAIR: SHX257

## 2015-05-27 HISTORY — DX: Sickle-cell trait: D57.3

## 2015-05-27 HISTORY — DX: Cerebral infarction, unspecified: I63.9

## 2015-05-27 HISTORY — PX: CARDIAC CATHETERIZATION: SHX172

## 2015-05-27 LAB — BASIC METABOLIC PANEL
ANION GAP: 9 (ref 5–15)
BUN: 15 mg/dL (ref 6–20)
CHLORIDE: 105 mmol/L (ref 101–111)
CO2: 26 mmol/L (ref 22–32)
Calcium: 9.4 mg/dL (ref 8.9–10.3)
Creatinine, Ser: 1.53 mg/dL — ABNORMAL HIGH (ref 0.61–1.24)
GFR calc non Af Amer: 55 mL/min — ABNORMAL LOW (ref >60)
Glucose, Bld: 99 mg/dL (ref 65–99)
POTASSIUM: 4.4 mmol/L (ref 3.5–5.1)
SODIUM: 140 mmol/L (ref 135–145)

## 2015-05-27 LAB — CBC
HEMATOCRIT: 42.3 % (ref 39.0–52.0)
HEMOGLOBIN: 14.5 g/dL (ref 13.0–17.0)
MCH: 28.3 pg (ref 26.0–34.0)
MCHC: 34.3 g/dL (ref 30.0–36.0)
MCV: 82.5 fL (ref 78.0–100.0)
Platelets: 260 10*3/uL (ref 150–400)
RBC: 5.13 MIL/uL (ref 4.22–5.81)
RDW: 13.7 % (ref 11.5–15.5)
WBC: 4.5 10*3/uL (ref 4.0–10.5)

## 2015-05-27 LAB — POCT ACTIVATED CLOTTING TIME
ACTIVATED CLOTTING TIME: 188 s
Activated Clotting Time: 219 seconds
Activated Clotting Time: 255 seconds
Activated Clotting Time: 178 seconds

## 2015-05-27 LAB — PROTIME-INR
INR: 1.12 (ref 0.00–1.49)
PROTHROMBIN TIME: 14.6 s (ref 11.6–15.2)

## 2015-05-27 SURGERY — ASD/VSD CLOSURE

## 2015-05-27 MED ORDER — HEPARIN SODIUM (PORCINE) 1000 UNIT/ML IJ SOLN
INTRAMUSCULAR | Status: AC
  Filled 2015-05-27: qty 1

## 2015-05-27 MED ORDER — MIDAZOLAM HCL 2 MG/2ML IJ SOLN
INTRAMUSCULAR | Status: AC
  Filled 2015-05-27: qty 2

## 2015-05-27 MED ORDER — SODIUM CHLORIDE 0.9% FLUSH
3.0000 mL | Freq: Two times a day (BID) | INTRAVENOUS | Status: DC
  Administered 2015-05-27 – 2015-05-28 (×2): 3 mL via INTRAVENOUS

## 2015-05-27 MED ORDER — SODIUM CHLORIDE 0.9 % WEIGHT BASED INFUSION
3.0000 mL/kg/h | INTRAVENOUS | Status: DC
  Administered 2015-05-27: 3 mL/kg/h via INTRAVENOUS

## 2015-05-27 MED ORDER — SODIUM CHLORIDE 0.9 % IV SOLN: 250.0000 mL | INTRAVENOUS | Status: DC | PRN

## 2015-05-27 MED ORDER — HEPARIN (PORCINE) IN NACL 2-0.9 UNIT/ML-% IJ SOLN
INTRAMUSCULAR | Status: AC
  Filled 2015-05-27: qty 500

## 2015-05-27 MED ORDER — SODIUM CHLORIDE 0.9 % IV SOLN: INTRAVENOUS | Status: AC

## 2015-05-27 MED ORDER — FENTANYL CITRATE (PF) 100 MCG/2ML IJ SOLN
INTRAMUSCULAR | Status: DC | PRN
  Administered 2015-05-27 (×2): 50 ug via INTRAVENOUS

## 2015-05-27 MED ORDER — CEFAZOLIN SODIUM-DEXTROSE 2-3 GM-% IV SOLR
INTRAVENOUS | Status: AC
  Filled 2015-05-27: qty 50

## 2015-05-27 MED ORDER — CLOPIDOGREL BISULFATE 75 MG PO TABS
75.0000 mg | ORAL_TABLET | Freq: Every day | ORAL | Status: DC
  Administered 2015-05-28: 10:00:00 75 mg via ORAL
  Filled 2015-05-27: qty 1

## 2015-05-27 MED ORDER — OXYCODONE-ACETAMINOPHEN 5-325 MG PO TABS
1.0000 | ORAL_TABLET | ORAL | Status: DC | PRN
  Administered 2015-05-27 – 2015-05-28 (×3): 1 via ORAL
  Filled 2015-05-27 (×3): qty 1

## 2015-05-27 MED ORDER — IOPAMIDOL (ISOVUE-370) INJECTION 76%
INTRAVENOUS | Status: AC
  Filled 2015-05-27: qty 50

## 2015-05-27 MED ORDER — SENNA 8.6 MG PO TABS
1.0000 | ORAL_TABLET | Freq: Two times a day (BID) | ORAL | Status: DC | PRN
  Administered 2015-05-28: 10:00:00 8.6 mg via ORAL
  Filled 2015-05-27 (×2): qty 1

## 2015-05-27 MED ORDER — ATORVASTATIN CALCIUM 40 MG PO TABS
40.0000 mg | ORAL_TABLET | Freq: Every day | ORAL | Status: DC
  Administered 2015-05-27: 18:00:00 40 mg via ORAL
  Filled 2015-05-27: qty 1

## 2015-05-27 MED ORDER — DOCUSATE SODIUM 100 MG PO CAPS: 100.0000 mg | ORAL_CAPSULE | Freq: Two times a day (BID) | ORAL | Status: DC | PRN

## 2015-05-27 MED ORDER — IOPAMIDOL (ISOVUE-370) INJECTION 76%: INTRAVENOUS | Status: DC | PRN

## 2015-05-27 MED ORDER — CEFAZOLIN SODIUM-DEXTROSE 2-3 GM-% IV SOLR
INTRAVENOUS | Status: DC | PRN
  Administered 2015-05-27: 2 g via INTRAVENOUS

## 2015-05-27 MED ORDER — SODIUM CHLORIDE 0.9% FLUSH: 3.0000 mL | INTRAVENOUS | Status: DC | PRN

## 2015-05-27 MED ORDER — SODIUM CHLORIDE 0.9 % IV SOLN
250.0000 mL | INTRAVENOUS | Status: DC | PRN
  Administered 2015-05-27: 250 mL via INTRAVENOUS

## 2015-05-27 MED ORDER — DIAZEPAM 5 MG PO TABS
5.0000 mg | ORAL_TABLET | ORAL | Status: DC | PRN
  Administered 2015-05-28: 5 mg via ORAL
  Filled 2015-05-27: qty 1

## 2015-05-27 MED ORDER — CLOPIDOGREL BISULFATE 75 MG PO TABS: 75.0000 mg | ORAL_TABLET | ORAL | Status: DC

## 2015-05-27 MED ORDER — FENTANYL CITRATE (PF) 100 MCG/2ML IJ SOLN
INTRAMUSCULAR | Status: AC
  Filled 2015-05-27: qty 2

## 2015-05-27 MED ORDER — LIDOCAINE HCL (PF) 1 % IJ SOLN
INTRAMUSCULAR | Status: AC
  Filled 2015-05-27: qty 30

## 2015-05-27 MED ORDER — ONDANSETRON HCL 4 MG/2ML IJ SOLN
4.0000 mg | Freq: Four times a day (QID) | INTRAMUSCULAR | Status: DC | PRN
Start: 2015-05-27 — End: 2015-05-28

## 2015-05-27 MED ORDER — MIDAZOLAM HCL 2 MG/2ML IJ SOLN
INTRAMUSCULAR | Status: DC | PRN
  Administered 2015-05-27: 2 mg via INTRAVENOUS
  Administered 2015-05-27: 1 mg via INTRAVENOUS

## 2015-05-27 MED ORDER — ACETAMINOPHEN 325 MG PO TABS: 650.0000 mg | ORAL_TABLET | ORAL | Status: DC | PRN

## 2015-05-27 MED ORDER — SODIUM CHLORIDE 0.9% FLUSH: 3.0000 mL | Freq: Two times a day (BID) | INTRAVENOUS | Status: DC

## 2015-05-27 MED ORDER — HEPARIN SODIUM (PORCINE) 1000 UNIT/ML IJ SOLN
INTRAMUSCULAR | Status: DC | PRN
  Administered 2015-05-27: 6000 [IU] via INTRAVENOUS

## 2015-05-27 MED ORDER — SODIUM CHLORIDE 0.9 % WEIGHT BASED INFUSION: 1.0000 mL/kg/h | INTRAVENOUS | Status: DC

## 2015-05-27 MED ORDER — ASPIRIN 81 MG PO CHEW: 81.0000 mg | CHEWABLE_TABLET | ORAL | Status: DC

## 2015-05-27 MED ORDER — ASPIRIN 81 MG PO CHEW
81.0000 mg | CHEWABLE_TABLET | Freq: Every day | ORAL | Status: DC
  Administered 2015-05-28: 81 mg via ORAL
  Filled 2015-05-27: qty 1

## 2015-05-27 SURGICAL SUPPLY — 15 items
CATH ACUNAV 8FR 90CM (CATHETERS) ×3 IMPLANT
CATH SUPER TORQUE PLUS 6F MPA1 (CATHETERS) ×3 IMPLANT
CLOSURE WOUND 1/2 X4 (GAUZE/BANDAGES/DRESSINGS) ×1
COVER SWIFTLINK CONNECTOR (BAG) ×3 IMPLANT
GUIDEWIRE AMPLATZER 1.5JX260 (WIRE) ×3 IMPLANT
GUIDEWIRE ANGLED .035X260CM (WIRE) ×3 IMPLANT
OCCLUDER AMPLATZER PFO 25MM (Prosthesis & Implant Heart) ×3 IMPLANT
PROTECTION STATION PRESSURIZED (MISCELLANEOUS) ×3
SHEATH PINNACLE 8F 10CM (SHEATH) ×3 IMPLANT
SHEATH PINNACLE 9F 10CM (SHEATH) ×6 IMPLANT
STATION PROTECTION PRESSURIZED (MISCELLANEOUS) ×1 IMPLANT
STRIP CLOSURE SKIN 1/2X4 (GAUZE/BANDAGES/DRESSINGS) ×2 IMPLANT
SYSTEM DELIVERY AMPLATZER 9FR (SHEATH) ×3 IMPLANT
WIRE EMERALD 3MM-J .035X150CM (WIRE) ×3 IMPLANT
WIRE ROSEN-J .035X260CM (WIRE) ×3 IMPLANT

## 2015-05-27 NOTE — Interval H&P Note (Signed)
History and Physical Interval Note:  05/27/2015 11:12 AM  Daniel Schneider  has presented today for surgery, with the diagnosis of pfo  The various methods of treatment have been discussed with the patient and family. After consideration of risks, benefits and other options for treatment, the patient has consented to  Procedure(s): ASD/VSD Closure (N/A) as a surgical intervention .  The patient's history has been reviewed, patient examined, no change in status, stable for surgery.  I have reviewed the patient's chart and labs.  Questions were answered to the patient's satisfaction.     Sherren Mocha

## 2015-05-27 NOTE — Progress Notes (Signed)
Site area: rt groin venous   Site Prior to Removal:  Level 0 Pressure Applied For:30 minutes Manual:yes    Patient Status During Pull:  awake Post Pull Site:  Level 0 Post Pull Instructions Given: yes  Post Pull Pulses Present: rt palpable DP Dressing Applied:  yes Bedrest begins @ 14:45 Comments:

## 2015-05-27 NOTE — H&P (View-Only) (Signed)
Cardiology Office Note Date:  05/06/2015   ID:  Daniel Schneider, DOB Jun 14, 1975, MRN [id number]  PCP:  No PCP Per Patient  Cardiologist:  None Referring: Dr Erlinda Hong   Chief Complaint  Patient presents with  . Numbness   History of Present Illness: Daniel Schneider is a 40 y.o. male who presents for evaluation of PFO and cryptogenic stroke. He is healthy without any chronic medical problems and presented acutely on 04/19/2015 with left sided numbness and slurred speech. MRI showed a right MCA-territory infarct and a chronic small parietal infarct. TEE and transcranial doppler are positive for PFO. The patient presents for consideration of transcatheter closure.   He has had a good recovery. Complaining of leg cramps and thinks these are medication-related. Tolerating aspirin without problems. Today, he denies symptoms of palpitations, chest pain, shortness of breath, orthopnea, PND, lower extremity edema, dizziness, or syncope. No further neurologic symptoms except for headache.   The patient is physically active. He doesn't smoke or drink alcohol. 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 He hasn't had any chronic medical problems and specifically denies HTN, diabetes, or any hospitalizations.   No past medical history on file.  Past Surgical History  Procedure Laterality Date  . Tee without cardioversion N/A 04/21/2015    Procedure: TRANSESOPHAGEAL ECHOCARDIOGRAM (TEE);  Surgeon: Sueanne Margarita, MD;  Location: [facility] ENDOSCOPY;  Service: Cardiovascular;  Laterality: N/A;    Current Outpatient Prescriptions  Medication Sig Dispense Refill  . aspirin 325 MG tablet Take 650 mg by mouth daily as needed (for arm and back numbness).     Marland Kitchen atorvastatin (LIPITOR) 40 MG tablet Take 1 tablet (40 mg total) by mouth daily at 6 PM. 30 tablet 3  . [START ON 05/11/2015] clopidogrel (PLAVIX) 75 MG tablet Take 1 tablet (75 mg total) by mouth daily. 90  tablet 3   No current facility-administered medications for this visit.    Allergies:   Review of patient's allergies indicates no known allergies.   Social History:  The patient  reports that he has never smoked. He has never used smokeless tobacco. He reports that he does not drink alcohol or use illicit drugs.   Family History:  The patient's family history includes Gout in his paternal grandfather; Healthy in his father and mother; Heart disease in his paternal grandfather; Sickle cell anemia in his maternal grandmother.    ROS:  Please see the history of present illness.  All other systems are reviewed and negative.   PHYSICAL EXAM: VS:  BP 106/70 mmHg  Pulse 69  Ht 5\' 6"  (1.676 m)  Wt 184 lb 1.9 oz (83.516 kg)  BMI 29.73 kg/m2 , BMI Body mass index is 29.73 kg/(m^2). GEN: Well nourished, well developed, physically-fit appearing African American man in no acute distress HEENT: normal Neck: no JVD, no masses. No carotid bruits Cardiac: RRR without murmur or gallop                Respiratory:  clear to auscultation bilaterally, normal work of breathing GI: soft, nontender, nondistended, + BS MS: no deformity or atrophy Ext: no pretibial edema, pedal pulses 2+= bilaterally Skin: warm and dry, no rash Neuro:  Strength and sensation are intact Psych: euthymic mood, full affect  EKG:  EKG is not ordered today.  Recent Labs: 04/19/2015: ALT 25; Hemoglobin 16.0; Platelets 245 04/22/2015: BUN 18; Creatinine, Ser 1.38*; Potassium 4.0; Sodium 143   Lipid Panel  Component Value Date/Time   CHOL 185 04/20/2015 0531   TRIG 59 04/20/2015 0531   HDL 37* 04/20/2015 0531   CHOLHDL 5.0 04/20/2015 0531   VLDL 12 04/20/2015 0531   LDLCALC 136* 04/20/2015 0531      Wt Readings from Last 3 Encounters:  05/06/15 184 lb 1.9 oz (83.516 kg)  04/19/15 182 lb 2 oz (82.611 kg)     Cardiac Studies Reviewed: TEE: Study Conclusions  - Left ventricle: Systolic function was normal.  Wall motion was  normal; there were no regional wall motion abnormalities. - Aortic valve: There was trivial regurgitation. - Left atrium: No evidence of thrombus in the atrial cavity or  appendage. - Right atrium: No evidence of thrombus in the atrial cavity or  appendage. - Atrial septum: No evidence of PFO by colorflow doppler but  agitated saline contrast study positive for microbubbles in the  LA after 2 cardiac cycles c/w PFO with right to left shunting.  There was a patent foramen ovale.  Transcranial Doppler: Summary:  - Positive TCD Bubble study indicative of a small right to left  intracradiac shunt at rest and post valsalva manouvre. - Dr. Erlinda Hong performed procedure.  Verbal content taken and risks/ benefits explained.  The right middle cerebral artery was insonated.  The IV in right forearm was used.   HITS heard at rest: [name]  HITS heard during Valsalva: [name]   PFO size: small.  MRI Brain: IMPRESSION: 1. Small acute right MCA territory frontal lobe cortical infarct. 2. Small chronic right parietal cortical infarct. 3. Normal variant head MRA without evidence of major branch occlusion or significant stenosis.  ASSESSMENT AND PLAN: PFO with cryptogenic stroke: I have personally reviewed the patient's TEE images and he does have a positive bubble study. Bubbles cross into the left heart within 3 cardiac cycles which is highly suggestive of an intracardiac shunt. His evaluation has also included a TCD bubble study which was suggestive of intracardiac shunt. We reviewed available clinical trial data regarding transcatheter PFO closure. This patient falls into the category of cryptogenic stroke and has a paucity of any other risk factors for stroke. His MRI showed an acute infarct and a chronic infarct, suggestive of recurrent embolic events. It seems reasonable to consider PFO closure in this circumstance. Based on my review of the TEE images, I  am not [id number] certain his PFO is large enough for closure, but I think Intracardiac echo at the time of planned closure will better define the septal anatomy. I suspect the PFO will be anatomically suitable for closure. He will be started on dual antiplatelet Rx with ASA and plavix prior to the procedure.  I reviewed the risks, indications, and alternatives to transcatheter closure with the patient. Specific risks include bleeding, infection, device embolization, stroke, cardiac perforation, tamponade, arrhythmia, MI, and late device erosion. He understands these serious risks occur at low incidence of < 1%. Post-procedural instructions/limitations were reviewed with the patient.   Current medicines are reviewed with the patient today.  The patient does not have concerns regarding medicines.  Labs/ tests ordered today include:   Orders Placed This Encounter  Procedures  . Basic metabolic panel  . CBC  . Protime-INR   Disposition:   Plan transcatheter PFO closure   Signed, Sherren Mocha, MD  05/06/2015 5:42 PM    [facility] [address], [place], [place]  [address] Phone: [phone]; Fax: [phone]

## 2015-05-28 ENCOUNTER — Other Ambulatory Visit: Payer: Self-pay

## 2015-05-28 ENCOUNTER — Encounter (HOSPITAL_COMMUNITY): Payer: Self-pay | Admitting: Student

## 2015-05-28 ENCOUNTER — Other Ambulatory Visit: Payer: Self-pay | Admitting: Student

## 2015-05-28 ENCOUNTER — Ambulatory Visit (HOSPITAL_BASED_OUTPATIENT_CLINIC_OR_DEPARTMENT_OTHER): Payer: BLUE CROSS/BLUE SHIELD

## 2015-05-28 DIAGNOSIS — Q211 Atrial septal defect: Secondary | ICD-10-CM

## 2015-05-28 DIAGNOSIS — Z7982 Long term (current) use of aspirin: Secondary | ICD-10-CM | POA: Diagnosis not present

## 2015-05-28 DIAGNOSIS — E785 Hyperlipidemia, unspecified: Secondary | ICD-10-CM | POA: Diagnosis not present

## 2015-05-28 DIAGNOSIS — Q2112 Patent foramen ovale: Secondary | ICD-10-CM

## 2015-05-28 DIAGNOSIS — Z7902 Long term (current) use of antithrombotics/antiplatelets: Secondary | ICD-10-CM | POA: Diagnosis not present

## 2015-05-28 LAB — ECHOCARDIOGRAM LIMITED
HEIGHTINCHES: 67 in
Weight: 2864 oz

## 2015-05-28 LAB — CBC
HEMATOCRIT: 40.8 % (ref 39.0–52.0)
Hemoglobin: 13.7 g/dL (ref 13.0–17.0)
MCH: 28 pg (ref 26.0–34.0)
MCHC: 33.6 g/dL (ref 30.0–36.0)
MCV: 83.3 fL (ref 78.0–100.0)
PLATELETS: 219 10*3/uL (ref 150–400)
RBC: 4.9 MIL/uL (ref 4.22–5.81)
RDW: 13.9 % (ref 11.5–15.5)
WBC: 5.3 10*3/uL (ref 4.0–10.5)

## 2015-05-28 MED ORDER — ASPIRIN 81 MG PO CHEW: 81.0000 mg | CHEWABLE_TABLET | Freq: Every day | ORAL | Status: DC

## 2015-05-28 MED FILL — Heparin Sodium (Porcine) 2 Unit/ML in Sodium Chloride 0.9%: INTRAMUSCULAR | Qty: 1000 | Status: AC

## 2015-05-28 MED FILL — Lidocaine HCl Local Preservative Free (PF) Inj 1%: INTRAMUSCULAR | Qty: 30 | Status: AC

## 2015-05-28 NOTE — Progress Notes (Signed)
Bernerd Pho NP made aware of oozing at R groin site. VS stable. Remains Level 0.

## 2015-05-28 NOTE — Discharge Summary (Signed)
Discharge Summary    Patient ID: Daniel Schneider,  MRN: [id number], DOB/AGE: 1975-08-03 40 y.o.  Admit date: 05/27/2015 Discharge date: 05/28/2015  Primary Care Provider: Hollis,Lachina M Primary Cardiologist: Dr. Burt Knack  Discharge Diagnoses    Principal Problem:   PFO (patent foramen ovale) Active Problems:   HLD (hyperlipidemia)   History of Present Illness     Daniel Schneider is a 40 y.o. male with past medical history of recent cryptogenic stroke and HLD who was found to have a PFO with positive bubble study. He was examined in the office on 05/06/2015 by Dr. Burt Knack for consideration of a transcatheter closure. Presented for the procedure on 05/27/2015. The risks and benefits of the procedure were explained in detail and he agreed to proceed.  Hospital Course     Consultants: None   He underwent successful PFO closure with a 16mm PFO occluder. The full report is included below. No complications were noted during the procedure.  Following successful sheath removal, he had some oozing at his right groin site throughout the evening. He was on bed rest and his groin appeared stable the following morning without evidence of a hematoma or bruit. His Hgb remained stable at 13.7. He ambulated down the hallway multiple times without difficulty.  Vitals and lab results were thoroughly reviewed. He was noted to have sinus pauses on telemetry up to 2.4 seconds but was asymptomatic with these. A repeat echocardiogram was performed and showed appropriate device position with no evidence of shunting. He was last examined by Dr. Burt Knack and deemed stable for discharge. He will continue on ASA 81mg  daily (indefinitely) and Plavix 75mg  daily (for 3 months). These prescriptions were provided pre-procedure and he will inform our office when he needs refills. He will need to follow SBE prophylaxis protocol for the next 6 months. He can return to work next week but was instructed not to lift over  50 pounds for at least 30 days. A work note was provided to the patient with these specific instructions.   Follow-up has been arranged at our office with a repeat limited echo and office visit with Dr. Burt Knack on 06/23/2015. The patient was discharged home in stable condition.  _____________  Discharge Vitals Blood pressure 111/61, pulse 54, temperature 98.1 F (36.7 C), temperature source Oral, resp. rate 13, height 5\' 7"  (1.702 m), weight 179 lb (81.194 kg), SpO2 100 %.  Filed Weights   05/27/15 0844  Weight: 179 lb (81.194 kg)    Labs & Radiologic Studies     CBC  Recent Labs  05/27/15 0911 05/28/15 0928  WBC 4.5 5.3  HGB 14.5 13.7  HCT 42.3 40.8  MCV 82.5 83.3  PLT 260 [id number]   Basic Metabolic Panel  Recent Labs  05/27/15 0911  NA 140  K 4.4  CL 105  CO2 26  GLUCOSE 99  BUN 15  CREATININE 1.53*  CALCIUM 9.4    Diagnostic Studies/Procedures     ASD/VSD Closure: 05/27/2015 There is a PFO. There is not an atrial septal aneurysm present. Agitated saline study indicates right-to-left shunt at rest. Balloon sizing was not performed. Intracardiac echo used for procedural guidance. 25 mm Amplatzer PFO occluder was used to close the defect. Agitated saline study indicates no right-to-left shunt post closure. There were no immediate procedural complications. 40 yo male with cryptogenic stroke found to have PFO by TEE. The right groin is prepped, draped, and anesthetized with 1% lidocaine. Using the modified Seldinger technique,  9 Fr and 8 Fr sheaths are inserted into the right femoral vein. ICE imaging is performed and there is a moderate-sized PFO with spontaneous left-to-right and right-to-left flow. Heparin is administered and a therapeutic ACT is achieved. An angled glidewire is advanced across the defect and changed out to a Rosen wire over a multipurpose catheter. The Rosen wire is advanced into the left upper pulmonary vein. Careful ICE imaging is performed and the defect  measures less than 6 mm in diameter, suggestive of an appropriate defect for a 25 mm PFO occluder. The device is prepped in sterile saline per protocol and a 9 Fr Torqvue sheath is advanced into the left atrium across the PFO. The device is deployed with positioning confirmed by fluoroscopy and TEE. The device is released and positioning felt to be appropriate. A bubble study is performed with no bubbles crossing into the left atrium. The patient is transferred to the recovery area in stable condition where his sheaths will be pulled and manual pressure used for hemostasis.   During this procedure the patient is administered a total of Versed 3 mg and Fentanyl 100 mg to achieve and maintain moderate conscious sedation. The patient's heart rate, blood pressure, and oxygen saturation are monitored continuously during the procedure. The period of conscious sedation is 45 minutes, of which I was present face-to-face 100% of this time.  Echocardiogram: 05/28/2015 Study Conclusions - Left ventricle: The cavity size was normal. Systolic function was  normal. The estimated ejection fraction was in the range of 50%  to 55%. Wall motion was normal; there were no regional wall  motion abnormalities. - Aortic valve: Trileaflet; mildly thickened, mildly calcified  leaflets. - Atrial septum: S/P PFO closure device which appears well seated.  No evidence of shunt by colorflow doppler.    Disposition   Pt is being discharged home today in good condition.  Follow-up Plans & Appointments    Follow-up Information    Follow up with Sherren Mocha, MD On 06/23/2015.   Specialty:  Cardiology   Why:  Cardiology Follow-up for PFO Closure on 06/23/2015. Echocardiogram scheduled for 8:30 and appointment with Dr. Burt Knack at 9:30.   Contact information:   [id number] [id number] [phone]      Discharge Instructions    Diet - low sodium heart healthy    Complete by:  As  directed      Discharge instructions    Complete by:  As directed   You may resume driving  tomorrow as long as your groin is not painful. You can return to work next week as long you do not lift anything over 50 pounds for the next 30 days.     Increase activity slowly    Complete by:  As directed            Discharge Medications   Current Discharge Medication List    START taking these medications   Details  aspirin 81 MG chewable tablet Chew 1 tablet (81 mg total) by mouth daily.      CONTINUE these medications which have NOT CHANGED   Details  atorvastatin (LIPITOR) 40 MG tablet Take 1 tablet (40 mg total) by mouth daily at 6 PM. Qty: 30 tablet, Refills: 3    clopidogrel (PLAVIX) 75 MG tablet Take 1 tablet (75 mg total) by mouth daily. Qty: 90 tablet, Refills: 3      STOP taking these medications     aspirin 325 MG tablet  Allergies No Known Allergies  Outstanding Labs/Studies   None  Duration of Discharge Encounter   Greater than 30 minutes including physician time.  Signed, Erma Heritage, PA-C 05/28/2015, 12:09 PM

## 2015-05-28 NOTE — Progress Notes (Signed)
After returning from the bathroom pt noticed that his R Femerol dressing was bloody. Dressing removed. Small amount of oozing noted from previous sheath insertion site. Pressure held for 15 minutes. Pressure dressing applied. Area around site remains a level 0.

## 2015-05-28 NOTE — Care Management Note (Signed)
Case Management Note  Patient Details  Name: Daniel Schneider MRN: [id number] Date of Birth: 02-27-1975  Subjective/Objective:  Patient is from home, s/p ASD/VSD closure,has bleeding from groin area, now stable.  Will be on plavix at dc.  NCM will cont to follow for dc needs.                   Action/Plan:   Expected Discharge Date:                  Expected Discharge Plan:  Home/Self Care  In-House Referral:     Discharge planning Services  CM Consult  Post Acute Care Choice:    Choice offered to:     DME Arranged:    DME Agency:     HH Arranged:    [place] Agency:     Status of Service:  Completed, signed off  Medicare Important Message Given:    Date Medicare IM Given:    Medicare IM give by:    Date Additional Medicare IM Given:    Additional Medicare Important Message give by:     If discussed at [place] of Stay Meetings, dates discussed:    Additional Comments:  Zenon Mayo, RN 05/28/2015, 11:20 AM

## 2015-05-28 NOTE — Progress Notes (Signed)
R Femerol oozing less. Pressure dressing changed.

## 2015-05-28 NOTE — Progress Notes (Signed)
  Echocardiogram 2D Echocardiogram has been performed.  Jennette Dubin 05/28/2015, 8:20 AM

## 2015-05-28 NOTE — Progress Notes (Signed)
Hospital Problem List     Principal Problem:   PFO (patent foramen ovale) Active Problems:   HLD (hyperlipidemia)    Patient Profile:   Primary Cardiologist: Dr. Burt Knack  40 yo male w/ PMH of recent cryptogenic stroke and found to have a PFO with positive bubble study. Was examined in the office on 05/06/2015 for consideration of a transcatheter closure. Presented for the procedure on 05/27/2015.  Subjective   Sleepy this AM. Had oozing at his right groin site overnight after he got up out of bed for the first time. Has not tried to get up since. Groin appears stable this AM with no bruit appreciated.    Inpatient Medications    . aspirin  81 mg Oral Daily  . atorvastatin  40 mg Oral q1800  . clopidogrel  75 mg Oral Daily  . sodium chloride flush  3 mL Intravenous Q12H    Vital Signs    Filed Vitals:   05/27/15 2229 05/28/15 0015 05/28/15 0420 05/28/15 0521  BP: 127/59 127/31 95/42   Pulse: 57 53 50 53  Temp:   97.3 F (36.3 C)   TempSrc:   Oral   Resp: 14 18 11 13   Height:      Weight:      SpO2: 97% 100% 99%     Intake/Output Summary (Last 24 hours) at 05/28/15 0715 Last data filed at 05/28/15 0522  Gross per 24 hour  Intake    360 ml  Output    500 ml  Net   -140 ml   Filed Weights   05/27/15 0844  Weight: 179 lb (81.194 kg)    Physical Exam    General: Well developed, well nourished, male appearing in no acute distress. Head: Normocephalic, atraumatic.  Neck: Supple without bruits, JVD not elevated. Lungs:  Resp regular and unlabored, CTA without wheezing or rales. Heart: RRR, S1, S2, no S3, S4, or murmur; no rub. Abdomen: Soft, non-tender, non-distended with normoactive bowel sounds. No hepatomegaly. No rebound/guarding. No obvious abdominal masses. Extremities: No clubbing, cyanosis, or edema. Distal pedal pulses are 2+ bilaterally. Right groin site without hematoma and no bruit appreciated. Mild oozing noted along dressing. Neuro: Alert and  oriented X 3. Moves all extremities spontaneously. Psych: Normal affect.  Labs    CBC  Recent Labs  05/27/15 0911  WBC 4.5  HGB 14.5  HCT 42.3  MCV 82.5  PLT [id number]   Basic Metabolic Panel  Recent Labs  05/27/15 0911  NA 140  K 4.4  CL 105  CO2 26  GLUCOSE 99  BUN 15  CREATININE 1.53*  CALCIUM 9.4    Telemetry    SR, HR ranging from high-40's - 60's. Occasional pauses noted, lasting up to 2.4 seconds.  ECG    Sinus bradycardia, HR 46 with 1st degree AV block.   Cardiac Studies and Radiology    ASD/VSD Closure: 05/27/2015  There is a PFO. There is not an atrial septal aneurysm present. Agitated saline study indicates right-to-left shunt at rest. Balloon sizing was not performed. Intracardiac echo used for procedural guidance. 25 mm Amplatzer PFO occluder was used to close the defect. Agitated saline study indicates no right-to-left shunt post closure. There were no immediate procedural complications. 40 yo male with cryptogenic stroke found to have PFO by TEE. The right groin is prepped, draped, and anesthetized with 1% lidocaine. Using the modified Seldinger technique, 9 Fr and 8 Fr sheaths are inserted into the right femoral vein.  ICE imaging is performed and there is a moderate-sized PFO with spontaneous left-to-right and right-to-left flow. Heparin is administered and a therapeutic ACT is achieved. An angled glidewire is advanced across the defect and changed out to a Rosen wire over a multipurpose catheter. The Rosen wire is advanced into the left upper pulmonary vein. Careful ICE imaging is performed and the defect measures less than 6 mm in diameter, suggestive of an appropriate defect for a 25 mm PFO occluder. The device is prepped in sterile saline per protocol and a 9 Fr Torqvue sheath is advanced into the left atrium across the PFO. The device is deployed with positioning confirmed by fluoroscopy and TEE. The device is released and positioning felt to be appropriate.  A bubble study is performed with no bubbles crossing into the left atrium. The patient is transferred to the recovery area in stable condition where his sheaths will be pulled and manual pressure used for hemostasis.   During this procedure the patient is administered a total of Versed 3 mg and Fentanyl 100 mg to achieve and maintain moderate conscious sedation. The patient's heart rate, blood pressure, and oxygen saturation are monitored continuously during the procedure. The period of conscious sedation is 45 minutes, of which I was present face-to-face 100% of this time.  Assessment & Plan    1. PFO Closure - recent cryptogenic stroke in 04/2015 and through workup he was and found to have a PFO with positive bubble study when a TEE was performed. Was examined in the office on 05/06/2015 for consideration of a transcatheter closure. The risks and benefits of the procedure were discussed and he agreed to proceed.  - s/p successful PFO closure with a 48mm PFO occluder on 05/27/2015. Limited repeat echo is pending for this AM (updated to Anticipated Discharge). Had mild bleeding at his groin site overnight, now stable. Will recheck CBC this AM. - continue ASA and Plavix. Will arrange hospital follow-up.  2. Sinus Pauses - noted up to 2.4 seconds on telemetry. - avoid BB therapy.  3. HLD - continue statin therapy.  Signed, Erma Heritage , PA-C 7:15 AM 05/28/2015 Pager: [phone]  Patient seen, examined. Available data reviewed. Agree with findings, assessment, and plan as outlined by Bernerd Pho, PA-C. The patient is doing well without complaints. Heart is regular rate and rhythm. Lungs are clear. Right groin site is tender but clear with no ecchymosis. Echo is reviewed and shows appropriate device position and no evidence of pericardial effusion. Plans are for discharge today. The patient should have 30 day follow-up with a limited echocardiogram. We discussed restrictions. He can  drive tomorrow as long as his groin is not painful. He can return to work next week as long as he does not lift anything over 50 pounds for a period of 30 days. He should follow SBE prophylaxis for 6 months. He should continue on indefinite aspirin 81 mg and should take Plavix 75 mg for a period of 3 months.  Sherren Mocha, M.D. 05/28/2015 11:23 AM

## 2015-05-28 NOTE — Progress Notes (Signed)
R groin oozing at insertion site. Pressure held for 5 minutes. Pressure dressing applied. VS obtained.

## 2015-05-29 ENCOUNTER — Ambulatory Visit (INDEPENDENT_AMBULATORY_CARE_PROVIDER_SITE_OTHER): Payer: BLUE CROSS/BLUE SHIELD | Admitting: Neurology

## 2015-05-29 ENCOUNTER — Telehealth: Payer: Self-pay | Admitting: Cardiovascular Disease

## 2015-05-29 ENCOUNTER — Encounter: Payer: Self-pay | Admitting: Neurology

## 2015-05-29 VITALS — BP 96/64 | HR 57 | Ht 68.0 in | Wt 185.0 lb

## 2015-05-29 DIAGNOSIS — I63411 Cerebral infarction due to embolism of right middle cerebral artery: Secondary | ICD-10-CM

## 2015-05-29 DIAGNOSIS — Q2112 Patent foramen ovale: Secondary | ICD-10-CM

## 2015-05-29 DIAGNOSIS — Q211 Atrial septal defect: Secondary | ICD-10-CM

## 2015-05-29 DIAGNOSIS — E785 Hyperlipidemia, unspecified: Secondary | ICD-10-CM | POA: Diagnosis not present

## 2015-05-29 NOTE — Telephone Encounter (Signed)
Spoke with Janett Billow at [employer] and she states that pt had contacted them and said that there was suppose to be an extension made for him to wear the monitor longer.  Janett Billow calling to confirm this.  Advised I would have to send message to Dr. Burt Knack to see if he requested this because I did not see any notation in pt's chart stating that he needed an extension.

## 2015-05-29 NOTE — Telephone Encounter (Signed)
New Message:   Would like an extension on his monitor please.

## 2015-05-29 NOTE — Progress Notes (Addendum)
STROKE NEUROLOGY FOLLOW UP NOTE  NAME: LADARRIS IHA DOB: 1975/10/18  REASON FOR VISIT: stroke follow up HISTORY FROM: pt and chart  Today we had the pleasure of seeing RODOLFO SHERTZER in follow-up at our Neurology Clinic. Pt was accompanied by cousin.   History Summary Mr. DANIL DEPENA is a 40 y.o. male with no significant PMH admitted on 04/19/15 for numbness involving his left arm and face and speech difficulties. MRI showed small acute right MCA cortical infarct as well as chronic right parietal cortical infarct. MRA, CUS, TTE and LE venous doppler negative. TCD bubble study showed [name] at rest and [name] with valsalva. TEE also showed PFO but on other SOE. LDL 136 and A1C 5.8. Hypercoagulable work up showed only positive sickle cell screening, likely sickle cell trait. He was discharged with ASA and lipitor and outpt follow up with Dr. Burt Knack for consideration of PFO closure.  Interval History During the interval time, the patient has been doing well. Followed up with Dr. Burt Knack and had PFO closure 05/28/15. Today, he came in still complain of right groin area sourness but able to walk with limping. BP 96/64. Otherwise, no complains.     REVIEW OF SYSTEMS: Full 14 system review of systems performed and notable only for those listed below and in HPI above, all others are negative:  Constitutional:   Cardiovascular:  Ear/Nose/Throat:   Skin:  Eyes:   Respiratory:   Gastroitestinal:   Genitourinary:  Hematology/Lymphatic:   Endocrine:  Musculoskeletal:   Allergy/Immunology:   Neurological:   Psychiatric:  Sleep:   The following represents the patient's updated allergies and side effects list: No Known Allergies  The neurologically relevant items on the patient's problem list were reviewed on today's visit.  Neurologic Examination  A problem focused neurological exam (12 or more points of the single system neurologic examination, vital signs counts as  1 point, cranial nerves count for 8 points) was performed.  Blood pressure 96/64, pulse 57, height 5\' 8"  (1.727 m), weight 185 lb (83.915 kg).  General - Well nourished, well developed, in no apparent distress.  Ophthalmologic - Sharp disc margins OU.  Cardiovascular - Regular rate and rhythm with no murmur.  Mental Status -  Level of arousal and orientation to time, place, and person were intact. Language including expression, naming, repetition, comprehension was assessed and found intact. Attention span and concentration were normal. Recent and remote memory were intact. Fund of Knowledge was assessed and was intact.  Cranial Nerves II - XII - II - Visual field intact OU. III, IV, VI - Extraocular movements intact. V - Facial sensation intact bilaterally. VII - Facial movement intact bilaterally. VIII - Hearing & vestibular intact bilaterally. X - Palate elevates symmetrically. XI - Chin turning & shoulder shrug intact bilaterally. XII - Tongue protrusion intact.  Motor Strength - The patient's strength was normal in all extremities except R hip extension 4/5 due to groin pain  and pronator drift was absent.  Bulk was normal and fasciculations were absent.   Motor Tone - Muscle tone was assessed at the neck and appendages and was normal.  Reflexes - The patient's reflexes were 1+ in all extremities and he had no pathological reflexes.  Sensory - Light touch, temperature/pinprick were assessed and were normal.    Coordination - The patient had normal movements in the hands and feet with no ataxia or dysmetria.  Tremor was absent.  Gait and Station - mild limping on  the right due to groin pain.   Functional score  mRS = 0   0 - No symptoms.   1 - No significant disability. Able to carry out all usual activities, despite some symptoms.   2 - Slight disability. Able to look after own affairs without assistance, but unable to carry out all previous activities.   3 -  Moderate disability. Requires some help, but able to walk unassisted.   4 - Moderately severe disability. Unable to attend to own bodily needs without assistance, and unable to walk unassisted.   5 - Severe disability. Requires constant nursing care and attention, bedridden, incontinent.   6 - Dead.   NIH Stroke Scale   Level Of Consciousness 0=Alert; keenly responsive 1=Not alert, but arousable by minor stimulation 2=Not alert, requires repeated stimulation 3=Responds only with reflex movements 0  LOC Questions to Month and Age 79=Answers both questions correctly 1=Answers one question correctly 2=Answers neither question correctly 0  LOC Commands      -Open/Close eyes     -Open/close grip 0=Performs both tasks correctly 1=Performs one task correctly 2=Performs neighter task correctly 0  Best Gaze 0=Normal 1=Partial gaze palsy 2=Forced deviation, or total gaze paresis 0  Visual 0=No visual loss 1=Partial hemianopia 2=Complete hemianopia 3=Bilateral hemianopia (blind including cortical blindness) 0  Facial Palsy 0=Normal symmetrical movement 1=Minor paralysis (asymmetry) 2=Partial paralysis (lower face) 3=Complete paralysis (upper and lower face) 0  Motor  0=No drift, limb holds posture for full 10 seconds 1=Drift, limb holds posture, no drift to bed 2=Some antigravity effort, cannot maintain posture, drifts to bed 3=No effort against gravity, limb falls 4=No movement Right Arm 0     Leg 1, due to pain    Left Arm 0     Leg 0  Limb Ataxia 0=Absent 1=Present in one limb 2=Present in two limbs 0  Sensory 0=Normal 1=Mild to moderate sensory loss 2=Severe to total sensory loss 0  Best Language 0=No aphasia, normal 1=Mild to moderate aphasia 2=Mute, global aphasia 3=Mute, global aphasia 0  Dysarthria 0=Normal 1=Mild to moderate 2=Severe, unintelligible or mute/anarthric 0  Extinction/Neglect 0=No abnormality 1=Extinction to bilateral simultaneous  stimulation 2=Profound neglect 0  Total   1     Data reviewed: I personally reviewed the images and agree with the radiology interpretations.  Ct Head Wo Contrast 04/19/2015  Normal head CT.   MRI / MRA HEAD 04/20/2015 1. Small acute right MCA territory frontal lobe cortical infarct. 2. Small chronic right parietal cortical infarct. 3. Normal variant head MRA without evidence of major branch occlusion or significant stenosis.  CUS - Bilateral: 1-39% ICA stenosis. Vertebral artery flow is antegrade.  LE venous doppler - no DVT  TTE  - Left ventricle: The cavity size was normal. Wall thickness was normal. Systolic function was normal. The estimated ejection fraction was in the range of 50% to 55%. Wall motion was normal; there were no regional wall motion abnormalities. Left ventricular diastolic function parameters were normal. Mpressions: No cardiac source of emboli was indentified.  TEE - Normal LV size and function Normal RV size and function Normal RA Normal LA Normal TV Normal PV with trivial PR Normal MV with trivial MR Normal trileaflet AV with trivial AR Normal interatrial septum by colorflow dopper but by agitated saline contrast there was evidence of microbubbles crossing the septum within 2 cardiac cycles c/w PFO with right to left shunt. Normal thoracic and ascending aorta.  TCD bubble study - at rest PFO spencer degree II,  with valsalva spencer degree III  Component     Latest Ref Rng 04/20/2015  Cholesterol     0 - 200 mg/dL 185  Triglycerides     <150 mg/dL 59  HDL Cholesterol     >40 mg/dL 37 (L)  Total CHOL/HDL Ratio      5.0  VLDL     0 - 40 mg/dL 12  LDL (calc)     0 - 99 mg/dL 136 (H)  Opiates     NONE DETECTED NONE DETECTED  COCAINE     NONE DETECTED NONE DETECTED  Benzodiazepines     NONE DETECTED NONE DETECTED  Amphetamines     NONE DETECTED NONE DETECTED  Tetrahydrocannabinol     NONE DETECTED NONE DETECTED  Barbiturates      NONE DETECTED NONE DETECTED  Alpha galactosidase, serum     28.0 - 80.0 nmol/hr/mg prt 55.9  Interpretation      Comment  Director Review      Comment  Methodology      Comment  PTT Lupus Anticoagulant     0.0 - 43.6 sec 36.8  DRVVT     0.0 - 44.0 sec 42.8  Lupus Anticoag Interp      Comment:  Beta-2 Glycoprotein I Ab, IgG     0 - 20 GPI IgG units <9  Beta-2-Glycoprotein I IgM     0 - 32 GPI IgM units <9  Beta-2-Glycoprotein I IgA     0 - 25 GPI IgA units <9  Anticardiolipin Ab,IgG,Qn     0 - 14 GPL U/mL <9  Anticardiolipin Ab,IgM,Qn     0 - 12 MPL U/mL <9  Anticardiolipin Ab,IgA,Qn     0 - 11 APL U/mL <9  Hemoglobin A1C     4.8 - 5.6 % 5.8 (H)  Mean Plasma Glucose      120  Recommendations-F5LEID:      Comment  Comment      Comment  Recommendations-PTGENE:      Comment  Additional Information      Comment  Antithrombin Activity     75 - 120 % 119  Protein C-Functional     73 - 180 % 110  Protein C, Total     60 - 150 % 82  Protein S-Functional     63 - 140 % 109  Protein S, Total     60 - 150 % 117  Homocysteine     0.0 - 15.0 umol/L 11.6  ANA Ab, IFA      Negative  Sickle Cell Screen     Negative Positive (A)  Sed Rate     0 - 16 mm/hr 1  CRP     <1.0 mg/dL 0.8    Assessment: As you may recall, he is a 40 y.o. African American male without significant PMH admitted on 04/19/15 for small acute right MCA cortical infarct as well as chronic right parietal cortical infarct. MRA, CUS, TTE and LE venous doppler negative. TCD bubble study showed [name] at rest and [name] with valsalva. TEE also showed PFO but on other SOE. LDL 136 and A1C 5.8. Hypercoagulable work up showed only positive sickle cell screening, likely sickle cell trait. He was discharged with ASA and lipitor and outpt follow up with Dr. Burt Knack and had PFO closure 05/28/15. Still complain of right groin area sourness but able to walk with mild limping.   Plan:  - continue ASA and plavix  for 6  months after PFO device - continue lipitor for stroke prevention - healthy diet and regular exercise - Follow up with your primary care physician for stroke risk factor modification. Recommend maintain blood pressure goal <130/80, diabetes with hemoglobin A1c goal below 6.5% and lipids with LDL cholesterol goal below 70 mg/dL.  - check BP regularly - follow up with Dr. Burt Knack in one month. - follow up in 6 month for repeat TCD bubble study.  I spent more than 25 minutes of face to face time with the patient. Greater than 50% of time was spent in counseling and coordination of care. We discussed PFO management, medication complaince, and follow up with Dr. Burt Knack.    No orders of the defined types were placed in this encounter.    No orders of the defined types were placed in this encounter.    Patient Instructions  - continue ASA and plavix for 6 months after PFO device - continue lipitor for stroke prevention - healthy diet and regular exercise - Follow up with your primary care physician for stroke risk factor modification. Recommend maintain blood pressure goal <130/80, diabetes with hemoglobin A1c goal below 6.5% and lipids with LDL cholesterol goal below 70 mg/dL.  - check BP regularly - follow up with Dr. Burt Knack in one month. - follow up in 6 month for repeat TCD bubble study.    Rosalin Hawking, MD PhD [facility] [address], [place], [place] [address] [phone]

## 2015-05-29 NOTE — Patient Instructions (Addendum)
-   continue ASA and plavix for 6 months after PFO device - continue lipitor for stroke prevention - healthy diet and regular exercise - Follow up with your primary care physician for stroke risk factor modification. Recommend maintain blood pressure goal <130/80, diabetes with hemoglobin A1c goal below 6.5% and lipids with LDL cholesterol goal below 70 mg/dL.  - check BP regularly - follow up with Dr. Burt Knack in one month. - follow up in 6 month for repeat TCD bubble study.

## 2015-05-30 DIAGNOSIS — I63411 Cerebral infarction due to embolism of right middle cerebral artery: Secondary | ICD-10-CM | POA: Insufficient documentation

## 2015-06-01 NOTE — Telephone Encounter (Signed)
I don't know of any reason why this would need to be extended. thx

## 2015-06-02 NOTE — Telephone Encounter (Signed)
I spoke with Daniel Schneider at [facility] and made her aware that Dr Burt Knack did not have any reason to extend monitor. [facility] will contact the pt.

## 2015-06-03 ENCOUNTER — Telehealth: Payer: Self-pay | Admitting: *Deleted

## 2015-06-03 ENCOUNTER — Encounter: Payer: BLUE CROSS/BLUE SHIELD | Admitting: Physician Assistant

## 2015-06-03 NOTE — Telephone Encounter (Signed)
Walk-in pt. Pt had a successful PFO on 05/28/15 via right groin. Pt C/O of having a knot on procedure area and is painful. Pt has  a lage discoloration on  upper abdomen and lower area on  the thigh below of  Incision. Pt states that all the discolored area is hard to touch. An appointment was made with Richardson Dopp PA at 3:00 PM today. Pt is aware will return for the appointment.

## 2015-06-03 NOTE — Progress Notes (Signed)
Cardiology Office Note:    Date:  06/03/2015   ID:  Rebeca Alert, DOB 1975/08/24, MRN [id number]  PCP:  Dorena Dew, FNP  Cardiologist:  Dr. Sherren Mocha   Electrophysiologist:  n/a  Referring MD: Dorena Dew, FNP   Chief Complaint  Patient presents with  . R FV site pain    History of Present Illness:     SILVIA MCNEILLY is a 40 y.o. 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 He was admitted in 3/17 with left-sided numbness and slurred speech. MRI confirmed right MCA territory infarct and chronic small parietal infarct. TEE and transcranial Doppler were positive for PFO. He saw Dr. Burt Knack in 3/17 for consideration of closure. He was admitted 4/18-4/19 and underwent successful PFO closure with 25 mm Amplatzer PFO occluder. Post procedure echo was negative for shunt. He was discharged on aspirin and Plavix for 3 months. He would then remain on aspirin indefinitely. He will need SBE prophylaxis for the next 6 months. He needs a 30 day FU limited Echo. FU with Dr. Sherren Mocha is next month.  He called in today with R groin pain and bruising post procedure.  He is added on for evaluation.      Past Medical History  Diagnosis Date  . Sickle cell trait ([place])   . Cryptogenic stroke ([place]) 05/2015    [name] 05/27/2015; left hand weaker since (05/26/2015)  . PFO (patent foramen ovale)     a. 05/27/2015: s/p PFO closure w/ 11mm PFO occluder     Past Surgical History  Procedure Laterality Date  . Tee without cardioversion N/A 04/21/2015    Procedure: TRANSESOPHAGEAL ECHOCARDIOGRAM (TEE);  Surgeon: Sueanne Margarita, MD;  Location: [facility];  Service: Cardiovascular;  Laterality: N/A;  . Asd and vsd repair  05/27/2015    [name] 05/27/2015  . Cardiac catheterization N/A 05/27/2015    Procedure: ASD/VSD Closure;  Surgeon: Sherren Mocha, MD;  Location: [place] [facility];  Service: Cardiovascular;  Laterality: N/A;    Current Medications: Outpatient  Prescriptions Prior to Visit  Medication Sig Dispense Refill  . aspirin 81 MG chewable tablet Chew 1 tablet (81 mg total) by mouth daily.    Marland Kitchen atorvastatin (LIPITOR) 40 MG tablet Take 1 tablet (40 mg total) by mouth daily at 6 PM. 30 tablet 3  . clopidogrel (PLAVIX) 75 MG tablet Take 1 tablet (75 mg total) by mouth daily. 90 tablet 3   No facility-administered medications prior to visit.     Allergies:   Review of patient's allergies indicates no known allergies.   Social History   Social History  . Marital Status: Legally Separated    Spouse Name: N/A  . Number of Children: 3  . Years of Education: College   Occupational History  .  Other    [employer]   Social History Main Topics  . Smoking status: Never Smoker   . Smokeless tobacco: Never Used  . Alcohol Use: No  . Drug Use: No  . Sexual Activity: Not Currently   Other Topics Concern  . Not on file   Social History Narrative   Patient lives at home with family.   Caffeine Use: occasionally     Family History:  The patient's family history includes Gout in his paternal grandfather; Healthy in his father and mother; Heart disease in his paternal grandfather; Sickle cell anemia in his maternal grandmother.   ROS:   Please see the history of  present illness.    ROS All other systems reviewed and are negative.   Physical Exam:    VS:  There were no vitals taken for this visit.   GEN: Well nourished, well developed, in no acute distress HEENT: normal Neck: no JVD, no masses Cardiac: Normal S1/S2, RRR; no murmurs, rubs, or gallops, no edema;   carotid bruits,   Respiratory:  clear to auscultation bilaterally; no wheezing, rhonchi or rales GI: soft, nontender, nondistended MS: no deformity or atrophy Skin: warm and dry Neuro: No focal deficits  Psych: Alert and oriented x 3, normal affect  Wt Readings from Last 3 Encounters:  05/29/15 185 lb (83.915 kg)  05/27/15 179 lb (81.194 kg)  05/26/15 184 lb 8 oz  (83.689 kg)      Studies/Labs Reviewed:     EKG:  EKG is  ordered today.  The ekg ordered today demonstrates   Recent Labs: 04/19/2015: ALT 25 05/27/2015: BUN 15; Creatinine, Ser 1.53*; Potassium 4.4; Sodium 140 05/28/2015: Hemoglobin 13.7; Platelets 219   Recent Lipid Panel    Component Value Date/Time   CHOL 185 04/20/2015 0531   TRIG 59 04/20/2015 0531   HDL 37* 04/20/2015 0531   CHOLHDL 5.0 04/20/2015 0531   VLDL 12 04/20/2015 0531   LDLCALC 136* 04/20/2015 0531    Additional studies/ records that were reviewed today include:    Echo 05/28/15 Ef 50-55%, no RWMA Atrial septum: S/P PFO closure device which appears well seated.  No evidence of shunt by colorflow doppler.  PFO closure 05/27/15 SUCCESSFUL TRANSCATHER PFO CLOSURE WITH A 25 MM AMPLATZER PFO OCCLUDER (NEGATIVE BUBBLE STUDY AFTER DEVICE DEPLOYMENT)   ASSESSMENT:     1. PFO (patent foramen ovale)     PLAN:     In order of problems listed above:  1.    Medication Adjustments/Labs and Tests Ordered: Current medicines are reviewed at length with the patient today.  Concerns regarding medicines are outlined above.  Medication changes, Labs and Tests ordered today are outlined in the Patient Instructions noted below. There are no Patient Instructions on file for this visit. Signed, Richardson Dopp, PA-C  06/03/2015 2:04 PM    [facility] [place], [place], [place]  [address] Phone: [phone]; Fax: [phone]     This encounter was created in error - please disregard.

## 2015-06-09 ENCOUNTER — Telehealth: Payer: Self-pay

## 2015-06-09 NOTE — Telephone Encounter (Signed)
PT ask about the procedure he had done to close the PFO. Rn explain he would need to call the cardiology office on [address]. Pt verbalized understanding.

## 2015-06-12 ENCOUNTER — Ambulatory Visit: Payer: No Typology Code available for payment source | Admitting: Nurse Practitioner

## 2015-06-19 ENCOUNTER — Telehealth: Payer: Self-pay | Admitting: Cardiovascular Disease

## 2015-06-19 NOTE — Telephone Encounter (Signed)
Pt has appt next week. He's had a lot of symptoms that I suspect are medication side-effect. Advise hold atorvastatin until he comes in for the visit to see if this helps. thx

## 2015-06-19 NOTE — Telephone Encounter (Signed)
Pt has pending appointment on 06/23/15 for Echo and Office visit.   The pt said he did not call the office in regards to symptoms.  He said our office contacted him to remind him of appointments on 06/23/15.  The pt said these symptoms have been on going and he will discuss them with Dr Burt Knack at his appointment.

## 2015-06-19 NOTE — Telephone Encounter (Signed)
New Message  Pt c/o of Chest Pain: 1. Are you having CP right now? No  2. Are you experiencing any other symptoms (ex. SOB, nausea, vomiting, sweating)? Dizziness 3. How long have you been experiencing CP? 2 weeks 4. Is your CP continuous or coming and going? Come and Go  5. Have you taken Nitroglycerin? No    Comments: Pt called states that he has had jaw numbness and his left arm is often cold and tingling. Pt also says that he has had headaches. Request a call back to discuss.

## 2015-06-20 NOTE — Telephone Encounter (Signed)
I spoke with the pt and he will hold his Atorvastatin over the weekend. We will reassess his symptoms on Monday during appointment.

## 2015-06-23 ENCOUNTER — Ambulatory Visit (INDEPENDENT_AMBULATORY_CARE_PROVIDER_SITE_OTHER): Payer: BLUE CROSS/BLUE SHIELD | Admitting: Cardiovascular Disease

## 2015-06-23 ENCOUNTER — Other Ambulatory Visit: Payer: Self-pay | Admitting: Cardiovascular Disease

## 2015-06-23 ENCOUNTER — Encounter: Payer: Self-pay | Admitting: Cardiovascular Disease

## 2015-06-23 ENCOUNTER — Ambulatory Visit (HOSPITAL_COMMUNITY): Payer: BLUE CROSS/BLUE SHIELD | Attending: Cardiovascular Disease

## 2015-06-23 ENCOUNTER — Other Ambulatory Visit: Payer: Self-pay

## 2015-06-23 VITALS — BP 92/64 | HR 64 | Ht 70.0 in | Wt 183.6 lb

## 2015-06-23 DIAGNOSIS — Q211 Atrial septal defect: Secondary | ICD-10-CM | POA: Insufficient documentation

## 2015-06-23 DIAGNOSIS — Q2112 Patent foramen ovale: Secondary | ICD-10-CM

## 2015-06-23 NOTE — Progress Notes (Signed)
Cardiology Office Note Date:  06/23/2015   ID:  Daniel Schneider, DOB 1975/08/22, MRN [id number]  PCP:  Dorena Dew, FNP  Cardiologist:  Sherren Mocha, MD    Chief Complaint  Patient presents with  . PFO    s/p closure. Complains of sharp chest pain comes and goes     History of Present Illness: Daniel Schneider is a 40 y.o. male who presents for follow-up after PFO closure. He presented on 04/19/2015 with left sided numbness and slurred speech. MRI showed a right MCA-territory infarct and a chronic small parietal infarct. TEE and transcranial doppler are positive for PFO. He underwent transcatheter PFO closure 05/27/2015 with a 25 mm Amplatzer PFO occluder without complication and presents today for follow-up.   He has stopped his statin drug on my recommendation because of a multitude of symptoms including myalgias, numbness, fatigue, weakness. He has had fleeting sharp chest pains. He did some physical work last week and became very dizzy. Had to sit down and rest for about an hour after that. No edema or palpitations.    Past Medical History  Diagnosis Date  . Sickle cell trait ([place])   . Cryptogenic stroke ([place]) 05/2015    [name] 05/27/2015; left hand weaker since (05/26/2015)  . PFO (patent foramen ovale)     a. 05/27/2015: s/p PFO closure w/ 12mm PFO occluder     Past Surgical History  Procedure Laterality Date  . Tee without cardioversion N/A 04/21/2015    Procedure: TRANSESOPHAGEAL ECHOCARDIOGRAM (TEE);  Surgeon: Sueanne Margarita, MD;  Location: [facility];  Service: Cardiovascular;  Laterality: N/A;  . Asd and vsd repair  05/27/2015    [name] 05/27/2015  . Cardiac catheterization N/A 05/27/2015    Procedure: ASD/VSD Closure;  Surgeon: Sherren Mocha, MD;  Location: [facility];  Service: Cardiovascular;  Laterality: N/A;    Current Outpatient Prescriptions  Medication Sig Dispense Refill  . aspirin 81 MG chewable tablet Chew 1 tablet (81 mg total) by mouth  daily.    . clopidogrel (PLAVIX) 75 MG tablet Take 1 tablet (75 mg total) by mouth daily. 90 tablet 3   No current facility-administered medications for this visit.    Allergies:   Review of patient's allergies indicates no known allergies.   Social History:  The patient  reports that he has never smoked. He has never used smokeless tobacco. He reports that he does not drink alcohol or use illicit drugs.   Family History:  The patient's  family history includes Gout in his paternal grandfather; Healthy in his father and mother; Heart disease in his paternal grandfather; Sickle cell anemia in his maternal grandmother.    ROS:  Please see the history of present illness.  Otherwise, review of systems is positive for headaches.  All other systems are reviewed and negative.    PHYSICAL EXAM: VS:  BP 92/64 mmHg  Pulse 64  Ht 5\' 10"  (1.778 m)  Wt 183 lb 9.6 oz (83.28 kg)  BMI 26.34 kg/m2 , BMI Body mass index is 26.34 kg/(m^2). GEN: Well nourished, well developed, in no acute distress HEENT: normal Neck: no JVD, no masses. No carotid bruits Cardiac: RRR without murmur or gallop                Respiratory:  clear to auscultation bilaterally, normal work of breathing GI: soft, nontender, nondistended, + BS MS: no deformity or atrophy Ext: no pretibial edema, pedal pulses 2+= bilaterally Skin: warm and dry, no  rash Neuro:  Strength and sensation are intact Psych: euthymic mood, full affect  EKG:  EKG is not ordered today.  Recent Labs: 04/19/2015: ALT 25 05/27/2015: BUN 15; Creatinine, Ser 1.53*; Potassium 4.4; Sodium 140 05/28/2015: Hemoglobin 13.7; Platelets 219   Lipid Panel     Component Value Date/Time   CHOL 185 04/20/2015 0531   TRIG 59 04/20/2015 0531   HDL 37* 04/20/2015 0531   CHOLHDL 5.0 04/20/2015 0531   VLDL 12 04/20/2015 0531   LDLCALC 136* 04/20/2015 0531      Wt Readings from Last 3 Encounters:  06/23/15 183 lb 9.6 oz (83.28 kg)  05/29/15 185 lb (83.915 kg)    05/27/15 179 lb (81.194 kg)     Cardiac Studies Reviewed: 2D Echo: Study Conclusions  - Left ventricle: The cavity size was normal. Systolic function was  normal. The estimated ejection fraction was in the range of 55%  to 60%. Wall motion was normal; there were no regional wall  motion abnormalities.  Transthoracic echocardiography. M-mode, limited 2D, limited spectral Doppler, and color Doppler. Birthdate: Patient birthdate: [birth year]. Age: Patient is 40 yr old. Sex: Gender: male. BMI: 28.1 kg/m^2. Blood pressure: 96/64 Patient status: Outpatient. Study date: Study date: 06/23/2015. Study time: 08:46 AM. Location: [facility]  -------------------------------------------------------------------  ------------------------------------------------------------------- Left ventricle: The cavity size was normal. Systolic function was normal. The estimated ejection fraction was in the range of 55% to 60%. Wall motion was normal; there were no regional wall motion abnormalities.  ------------------------------------------------------------------- Aortic valve: Not well visualized.  ------------------------------------------------------------------- Aorta: Aortic root: The aortic root was normal in size.  ------------------------------------------------------------------- Mitral valve: Structurally normal valve. Mobility was not restricted.  ------------------------------------------------------------------- Left atrium: The atrium was normal in size.  ------------------------------------------------------------------- Atrial septum: S/p PFO closure device with no evidence of residual shunting. Device is well-seated.  ------------------------------------------------------------------- Right ventricle: The cavity size was normal. Wall thickness was normal. Systolic function was  normal.  ------------------------------------------------------------------- Tricuspid valve: Structurally normal valve.  ------------------------------------------------------------------- Pulmonary artery: The main pulmonary artery was normal-sized.  ------------------------------------------------------------------- Right atrium: The atrium was normal in size.  ------------------------------------------------------------------- Pericardium: There was no pericardial effusion.  ASSESSMENT AND PLAN: 1.  PFO s/p transcatheter closure: pt with a lot of symptoms, but objective data looks good with echo reviewed today. Symptoms may be combination of statin side-effects, post-stroke symptoms, or device-related. Encouraged him that I think he will continue to improve over the next few months.  Recommend: Continue ASA/Plavix. TCD Bubble Study ordered for 6 month FU Limited echo at 6 months Stay off Statin  Current medicines are reviewed with the patient today.  The patient does not have concerns regarding medicines.  Labs/ tests ordered today include:  Orders Placed This Encounter  Procedures  . Echocardiogram    Disposition:   FU 6 months  Signed, Sherren Mocha, MD  06/23/2015 10:23 AM    [facility] [address], [place], [place]  [address] Phone: [phone]; Fax: [phone]

## 2015-06-23 NOTE — Patient Instructions (Addendum)
Medication Instructions:  Your physician recommends that you continue on your current medications as directed. Please refer to the Current Medication list given to you today.  Atorvastatin will be removed from medication list (this was on hold).   Your physician discussed the importance of taking an antibiotic prior to any dental, gastrointestinal, genitourinary procedures to prevent damage to the heart valves from infection the first 6 MONTHS after PFO closure.   Labwork: No new orders.   Testing/Procedures: Your physician has requested that you have an Limited echocardiogram in 6 MONTHS. Echocardiography is a painless test that uses sound waves to create images of your heart. It provides your doctor with information about the size and shape of your heart and how well your heart's chambers and valves are working. This procedure takes approximately one hour. There are no restrictions for this procedure.  Your physician has recommended Transcranial dopplers in 6 MONTHS.  Follow-Up: Your physician wants you to follow-up in: 6 MONTHS with Dr Burt Knack.  You will receive a reminder letter in the mail two months in advance. If you don't receive a letter, please call our office to schedule the follow-up appointment.    Any Other Special Instructions Will Be Listed Below (If Applicable).     If you need a refill on your cardiac medications before your next appointment, please call your pharmacy.

## 2015-07-15 ENCOUNTER — Telehealth: Payer: Self-pay | Admitting: *Deleted

## 2015-07-15 ENCOUNTER — Encounter: Payer: Self-pay | Admitting: Cardiovascular Disease

## 2015-07-15 NOTE — Telephone Encounter (Signed)
Rn call patient back on his cell number. Rn explain his vm is not set up. Patient explain he is back at work,and is having dizzy spells at time. 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 Pt states he needs a letter for medicaid stating he had a stroke and that he was unable to work while hospitalized. Rn stated Dr. 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 Patient stated he has been making money but sometimes finds it difficult to continue working. Rn stated Dr. Erlinda Hong will have to evaluate his chart, and he makes the final decision on working. Pt stated he has a letter about that he cant work and someone has to sign it. Rn stated Dr Erlinda Hong cannot sign a letter already written. Rn stated Dr. Erlinda Hong is currently working in the hospital this week and will not return to office until June 12 which is Monday. Pt states he needs if for his kids to get medicaid. Message will be sent to Dr. Erlinda Hong.

## 2015-07-15 NOTE — Telephone Encounter (Signed)
Patient returned Katrina's call °

## 2015-07-15 NOTE — Telephone Encounter (Signed)
Unable to leave message with patient. Rn call patients listed number and his vm was not set up. Pt came to [place] today about some symptoms he is having. Rn will try again

## 2015-07-15 NOTE — Telephone Encounter (Signed)
Called the pt and he stated that he has written a letter for his kids benefit due to his absence from work. He will drop the letter to our office for me to sign.   Hi, Katrina, could you please get the letter and I will see it next week to see if it is appropriate for me to sign. Thanks.   Rosalin Hawking, MD PhD Stroke Neurology 07/15/2015 4:44 PM

## 2015-07-23 ENCOUNTER — Encounter: Payer: Self-pay | Admitting: Neurology

## 2015-07-24 NOTE — Telephone Encounter (Signed)
Rn call patient about his letter was written by Dr. Erlinda Hong. Rn stated Dr. Erlinda Hong wrote a letter base on his medical opinion. Cheyene stated he wrote the letter the best he could. Rn stated letter will be at the front desk. Pt verbalized understanding.

## 2015-08-27 ENCOUNTER — Encounter (HOSPITAL_COMMUNITY): Payer: Self-pay | Admitting: Emergency Medicine

## 2015-08-27 ENCOUNTER — Ambulatory Visit (HOSPITAL_COMMUNITY)
Admission: EM | Admit: 2015-08-27 | Discharge: 2015-08-27 | Disposition: A | Discharge status: Home / Self Care | Payer: BLUE CROSS/BLUE SHIELD | Attending: Emergency Medicine | Admitting: Emergency Medicine

## 2015-08-27 DIAGNOSIS — Z79899 Other long term (current) drug therapy: Secondary | ICD-10-CM | POA: Diagnosis not present

## 2015-08-27 DIAGNOSIS — N4889 Other specified disorders of penis: Secondary | ICD-10-CM

## 2015-08-27 DIAGNOSIS — K2970 Gastritis, unspecified, without bleeding: Secondary | ICD-10-CM | POA: Diagnosis not present

## 2015-08-27 DIAGNOSIS — Z8673 Personal history of transient ischemic attack (TIA), and cerebral infarction without residual deficits: Secondary | ICD-10-CM | POA: Insufficient documentation

## 2015-08-27 DIAGNOSIS — Z7982 Long term (current) use of aspirin: Secondary | ICD-10-CM | POA: Diagnosis not present

## 2015-08-27 DIAGNOSIS — R109 Unspecified abdominal pain: Secondary | ICD-10-CM | POA: Diagnosis present

## 2015-08-27 DIAGNOSIS — Z9889 Other specified postprocedural states: Secondary | ICD-10-CM | POA: Insufficient documentation

## 2015-08-27 DIAGNOSIS — D573 Sickle-cell trait: Secondary | ICD-10-CM | POA: Diagnosis not present

## 2015-08-27 LAB — POCT URINALYSIS DIP (DEVICE)
BILIRUBIN URINE: NEGATIVE
Glucose, UA: NEGATIVE mg/dL
HGB URINE DIPSTICK: NEGATIVE
Ketones, ur: NEGATIVE mg/dL
LEUKOCYTES UA: NEGATIVE
Nitrite: NEGATIVE
PH: 6.5 (ref 5.0–8.0)
Protein, ur: NEGATIVE mg/dL
SPECIFIC GRAVITY, URINE: 1.02 (ref 1.005–1.030)
Urobilinogen, UA: 0.2 mg/dL (ref 0.0–1.0)

## 2015-08-27 MED ORDER — CEFTRIAXONE SODIUM 250 MG IJ SOLR
INTRAMUSCULAR | Status: AC
  Filled 2015-08-27: qty 250

## 2015-08-27 MED ORDER — AZITHROMYCIN 250 MG PO TABS
ORAL_TABLET | ORAL | Status: AC
  Filled 2015-08-27: qty 4

## 2015-08-27 MED ORDER — FLUCONAZOLE 100 MG PO TABS: 100.0000 mg | ORAL_TABLET | Freq: Every day | ORAL | Status: DC

## 2015-08-27 MED ORDER — AZITHROMYCIN 250 MG PO TABS
1000.0000 mg | ORAL_TABLET | Freq: Once | ORAL | Status: AC
  Administered 2015-08-27: 1000 mg via ORAL

## 2015-08-27 MED ORDER — CEFTRIAXONE SODIUM 250 MG IJ SOLR
250.0000 mg | Freq: Once | INTRAMUSCULAR | Status: AC
  Administered 2015-08-27: 250 mg via INTRAMUSCULAR

## 2015-08-27 MED ORDER — LIDOCAINE HCL (PF) 1 % IJ SOLN
INTRAMUSCULAR | Status: AC
  Filled 2015-08-27: qty 2

## 2015-08-27 NOTE — ED Provider Notes (Signed)
CSN: [id number]     Arrival date & time 08/27/15  1008 History   First MD Initiated Contact with Patient 08/27/15 1018     Chief Complaint  Patient presents with  . Abdominal Pain   (Consider location/radiation/quality/duration/timing/severity/associated sxs/prior Treatment) HPI He is a 40 year old man here for evaluation of abdominal pain and penile irritation. He states this morning he woke up with a cramping sensation in his upper abdomen. This is associated with loss of appetite, but denies nausea or vomiting. He reports a bowel movement yesterday that was a little bit softer than normal. No bowel movement yet today. He denies any questionable foods or new restaurants. However, he did eat over half a bag of cherries yesterday. He also reports feeling a little bit of irritation and itching on the underside of his penis. He denies any rashes or lesions. No discharge or dysuria. He states he had similar symptoms previously when his wife had a yeast infection. He denies any new sexual partners.  Past Medical History  Diagnosis Date  . Sickle cell trait ([place])   . Cryptogenic stroke ([place]) 05/2015    [name] 05/27/2015; left hand weaker since (05/26/2015)  . PFO (patent foramen ovale)     a. 05/27/2015: s/p PFO closure w/ 66mm PFO occluder    Past Surgical History  Procedure Laterality Date  . Tee without cardioversion N/A 04/21/2015    Procedure: TRANSESOPHAGEAL ECHOCARDIOGRAM (TEE);  Surgeon: Sueanne Margarita, MD;  Location: [facility];  Service: Cardiovascular;  Laterality: N/A;  . Asd and vsd repair  05/27/2015    [name] 05/27/2015  . Cardiac catheterization N/A 05/27/2015    Procedure: ASD/VSD Closure;  Surgeon: Sherren Mocha, MD;  Location: [facility];  Service: Cardiovascular;  Laterality: N/A;   Family History  Problem Relation Age of Onset  . Healthy Mother   . Healthy Father   . Sickle cell anemia Maternal Grandmother   . Heart disease Paternal Grandfather   . Gout Paternal  Grandfather    Social History  Substance Use Topics  . Smoking status: Never Smoker   . Smokeless tobacco: Never Used  . Alcohol Use: No    Review of Systems As in history of present illness Allergies  Review of patient's allergies indicates no known allergies.  Home Medications   Prior to Admission medications   Medication Sig Start Date End Date Taking? Authorizing Provider  aspirin 81 MG chewable tablet Chew 1 tablet (81 mg total) by mouth daily. 05/28/15  Yes Erma Heritage, PA  clopidogrel (PLAVIX) 75 MG tablet Take 1 tablet (75 mg total) by mouth daily. 05/11/15   Sherren Mocha, MD  fluconazole (DIFLUCAN) 100 MG tablet Take 1 tablet (100 mg total) by mouth daily. 08/27/15   Melony Overly, MD   Meds Ordered and Administered this Visit   Medications  cefTRIAXone (ROCEPHIN) injection 250 mg (250 mg Intramuscular Given 08/27/15 1059)  azithromycin (ZITHROMAX) tablet 1,000 mg (1,000 mg Oral Given 08/27/15 1059)    BP 137/91 mmHg  Pulse 71  Temp(Src) 98.6 F (37 C) (Oral)  Resp 18  SpO2 100% No data found.   Physical Exam  Constitutional: He is oriented to person, place, and time. He appears well-developed and well-nourished. No distress.  Neck: Neck supple.  Cardiovascular: Normal rate, regular rhythm and normal heart sounds.   No murmur heard. Pulmonary/Chest: Effort normal and breath sounds normal. No respiratory distress. He has no wheezes. He has no rales.  Abdominal: Soft. Bowel sounds  are normal. He exhibits no distension and no mass. There is tenderness (in epigastric and right upper quadrant). There is no rebound and no guarding.  Genitourinary: Testes normal and penis normal. Right testis shows no mass and no tenderness. Left testis shows no mass and no tenderness. Circumcised. No penile erythema or penile tenderness. No discharge found.  Lymphadenopathy:       Right: No inguinal adenopathy present.       Left: No inguinal adenopathy present.  Neurological: He  is alert and oriented to person, place, and time.    ED Course  Procedures (including critical care time)  Labs Review Labs Reviewed  POCT URINALYSIS DIP (DEVICE)  URINE CYTOLOGY ANCILLARY ONLY    Imaging Review No results found.    MDM   1. Gastritis   2. Penile irritation    I suspect these are 2 separate issues. He likely has some gastritis from eating so many cherries yesterday. I recommended OTC Prilosec for the next several days. No fever or Murphy sign to suggest acute gallbladder disease. We treated him presumptively with Rocephin and azithromycin at his request. I provided a prescription for Diflucan for what is likely yeast. Follow-up as needed.    Melony Overly, MD 08/27/15 1120

## 2015-08-27 NOTE — Discharge Instructions (Signed)
From what you describe, I suspect the irritation is from a little bit of yeast. Take Diflucan daily for 3 days. We treated you for STDs today, just to be safe. We will call you if your testing comes back positive.  I suspect your stomach pain is coming from a little bit of gastritis from eating all those cherries. Please get some Prilosec from the drugstore and take this once a day for the next 3 or 4 days.  Follow-up as needed.

## 2015-08-27 NOTE — ED Notes (Signed)
The patient presented to the [facility] with a complaint of abdominal pain and a "tingling" in his groin area for 3 days. The patient requested STD testing.

## 2015-08-28 LAB — URINE CYTOLOGY ANCILLARY ONLY
CHLAMYDIA, DNA PROBE: NEGATIVE
NEISSERIA GONORRHEA: NEGATIVE
Trichomonas: NEGATIVE

## 2015-08-29 LAB — URINE CYTOLOGY ANCILLARY ONLY: CANDIDA VAGINITIS: NEGATIVE

## 2015-11-12 ENCOUNTER — Other Ambulatory Visit: Payer: Self-pay

## 2015-11-12 DIAGNOSIS — Q211 Atrial septal defect: Secondary | ICD-10-CM

## 2015-11-12 DIAGNOSIS — Q2112 Patent foramen ovale: Secondary | ICD-10-CM

## 2015-11-18 ENCOUNTER — Telehealth: Payer: Self-pay | Admitting: Neurology

## 2015-11-18 NOTE — Telephone Encounter (Signed)
TCD Bubble needs to be done in October. No Pa need Dr. Legrand Como Copper / XU . No Pa Needed. Thanks Hinton Dyer.

## 2015-11-25 ENCOUNTER — Ambulatory Visit: Payer: BLUE CROSS/BLUE SHIELD | Admitting: Family Medicine

## 2015-11-26 ENCOUNTER — Ambulatory Visit (INDEPENDENT_AMBULATORY_CARE_PROVIDER_SITE_OTHER): Payer: BLUE CROSS/BLUE SHIELD

## 2015-11-26 DIAGNOSIS — Q2112 Patent foramen ovale: Secondary | ICD-10-CM

## 2015-11-26 DIAGNOSIS — Q211 Atrial septal defect: Secondary | ICD-10-CM | POA: Diagnosis not present

## 2015-11-26 NOTE — Progress Notes (Signed)
Patient here for bubble study. Rn inserted 22 gauge in right AC. Skin was clean and prep. IV flush with good blood return.. Skin intact and normal. Patient tolerate IV in right arm. Dr Erlinda Hong at bedside ready for bubble study.

## 2015-11-28 ENCOUNTER — Encounter: Payer: Self-pay | Admitting: Neurology

## 2015-11-28 ENCOUNTER — Ambulatory Visit (INDEPENDENT_AMBULATORY_CARE_PROVIDER_SITE_OTHER): Payer: BLUE CROSS/BLUE SHIELD | Admitting: Neurology

## 2015-11-28 VITALS — BP 112/67 | HR 58 | Ht 70.0 in | Wt 181.0 lb

## 2015-11-28 DIAGNOSIS — D573 Sickle-cell trait: Secondary | ICD-10-CM | POA: Diagnosis not present

## 2015-11-28 DIAGNOSIS — Q2112 Patent foramen ovale: Secondary | ICD-10-CM

## 2015-11-28 DIAGNOSIS — Q211 Atrial septal defect: Secondary | ICD-10-CM

## 2015-11-28 DIAGNOSIS — I63411 Cerebral infarction due to embolism of right middle cerebral artery: Secondary | ICD-10-CM

## 2015-11-28 NOTE — Patient Instructions (Signed)
-   continue ASA and plavix for now and follow up with Dr. Burt Knack in 2 weeks. - healthy diet and regular exercise - Follow up with your primary care physician for stroke risk factor modification. Recommend maintain blood pressure goal <130/80, diabetes with hemoglobin A1c goal below 6.5% and lipids with LDL cholesterol goal below 70 mg/dL.  - relaxation and de-stress and cope with stress - check BP regularly - follow up in 6 month.

## 2015-11-28 NOTE — Progress Notes (Signed)
STROKE NEUROLOGY FOLLOW UP NOTE  NAME: Daniel Schneider DOB: 22-Jul-1975  REASON FOR VISIT: stroke follow up HISTORY FROM: pt and chart  Today we had the pleasure of seeing Daniel Schneider in follow-up at our Neurology Clinic. Pt was accompanied by cousin.   History Summary [name] is a 40 y.o. male with no significant PMH admitted on 04/19/15 for numbness involving his left arm and face and speech difficulties. MRI showed small acute right MCA cortical infarct as well as chronic right parietal cortical infarct. MRA, CUS, TTE and LE venous doppler negative. TCD bubble study showed [name] at rest and [name] with valsalva. TEE also showed PFO but on other SOE. LDL 136 and A1C 5.8. Hypercoagulable work up showed only positive sickle cell screening, likely sickle cell trait. He was discharged with ASA and lipitor and outpt follow up with Dr. Burt Knack for consideration of PFO closure.  05/29/15 follow up - the patient has been doing well. Followed up with Dr. Burt Knack and had PFO closure 05/28/15. Today, he came in still complain of right groin area sourness but able to walk with limping. BP 96/64. Otherwise, no complains.    Interval History During the interval time, the pt has been doing well. Still on DAPT. Had TCD bubble study 2 days ago showed no HIT at rest and spencer degree II with valsalva. This is better than 7 months ago when TCD bubble study showed spencer degree II at rest and III with valsalva.   He complains of left top of head tingling radiating to left face as well as periorally since yesterday. He also had brief episode like that after the bubble study 2 days ago. No HA, neck pain, no hx of migraine. On further questioning, he also had similar feeling last admission with PFO closure. His feeling likely due to anxiety and he agrees.   REVIEW OF SYSTEMS: Full 14 system review of systems performed and notable only for those listed below and in HPI above, all others  are negative:  Constitutional:   Cardiovascular:  Ear/Nose/Throat:   Skin:  Eyes:   Respiratory:   Gastroitestinal:   Genitourinary:  Hematology/Lymphatic:   Endocrine:  Musculoskeletal:   Allergy/Immunology:   Neurological:  Numbness, tremors Psychiatric:  Sleep:   The following represents the patient's updated allergies and side effects list: No Known Allergies  The neurologically relevant items on the patient's problem list were reviewed on today's visit.  Neurologic Examination  A problem focused neurological exam (12 or more points of the single system neurologic examination, vital signs counts as 1 point, cranial nerves count for 8 points) was performed.  Blood pressure 112/67, pulse (!) 58, height 5\' 10"  (1.778 m), weight 181 lb (82.1 kg).  General - Well nourished, well developed, in no apparent distress.  Ophthalmologic - Sharp disc margins OU.  Cardiovascular - Regular rate and rhythm with no murmur.  Mental Status -  Level of arousal and orientation to time, place, and person were intact. Language including expression, naming, repetition, comprehension was assessed and found intact. Attention span and concentration were normal. Recent and remote memory were intact. Fund of Knowledge was assessed and was intact.  Cranial Nerves II - XII - II - Visual field intact OU. III, IV, VI - Extraocular movements intact. V - Facial sensation intact bilaterally. VII - Facial movement intact bilaterally. VIII - Hearing & vestibular intact bilaterally. X - Palate elevates symmetrically. XI - Chin turning & shoulder shrug  intact bilaterally. XII - Tongue protrusion intact.  Motor Strength - The patient's strength was normal in all extremities and pronator drift was absent.  Bulk was normal and fasciculations were absent.   Motor Tone - Muscle tone was assessed at the neck and appendages and was normal.  Reflexes - The patient's reflexes were 1+ in all extremities and  he had no pathological reflexes.  Sensory - Light touch, temperature/pinprick were assessed and were normal.    Coordination - The patient had normal movements in the hands and feet with no ataxia or dysmetria.  Tremor was absent.  Gait and Station - normal   Data reviewed: I personally reviewed the images and agree with the radiology interpretations.  Ct Head Wo Contrast 04/19/2015  Normal head CT.   MRI / MRA HEAD 04/20/2015 1. Small acute right MCA territory frontal lobe cortical infarct. 2. Small chronic right parietal cortical infarct. 3. Normal variant head MRA without evidence of major branch occlusion or significant stenosis.  CUS - Bilateral: 1-39% ICA stenosis. Vertebral artery flow is antegrade.  LE venous doppler - no DVT  TTE  - Left ventricle: The cavity size was normal. Wall thickness was normal. Systolic function was normal. The estimated ejection fraction was in the range of 50% to 55%. Wall motion was normal; there were no regional wall motion abnormalities. Left ventricular diastolic function parameters were normal. Mpressions: No cardiac source of emboli was indentified.  TEE - Normal LV size and function Normal RV size and function Normal RA Normal LA Normal TV Normal PV with trivial PR Normal MV with trivial MR Normal trileaflet AV with trivial AR Normal interatrial septum by colorflow dopper but by agitated saline contrast there was evidence of microbubbles crossing the septum within 2 cardiac cycles c/w PFO with right to left shunt. Normal thoracic and ascending aorta.  TCD bubble study - at rest PFO spencer degree II, with valsalva spencer degree III  TCD bubble study 11/28/15 - at rest no HIT, with valsalva spencer degree II.   Component     Latest Ref Rng 04/20/2015  Cholesterol     0 - 200 mg/dL 185  Triglycerides     <150 mg/dL 59  HDL Cholesterol     >40 mg/dL 37 (L)  Total CHOL/HDL Ratio      5.0  VLDL     0 - 40 mg/dL 12  LDL  (calc)     0 - 99 mg/dL 136 (H)  Opiates     NONE DETECTED NONE DETECTED  COCAINE     NONE DETECTED NONE DETECTED  Benzodiazepines     NONE DETECTED NONE DETECTED  Amphetamines     NONE DETECTED NONE DETECTED  Tetrahydrocannabinol     NONE DETECTED NONE DETECTED  Barbiturates     NONE DETECTED NONE DETECTED  Alpha galactosidase, serum     28.0 - 80.0 nmol/hr/mg prt 55.9  Interpretation      Comment  Director Review      Comment  Methodology      Comment  PTT Lupus Anticoagulant     0.0 - 43.6 sec 36.8  DRVVT     0.0 - 44.0 sec 42.8  Lupus Anticoag Interp      Comment:  Beta-2 Glycoprotein I Ab, IgG     0 - 20 GPI IgG units <9  Beta-2-Glycoprotein I IgM     0 - 32 GPI IgM units <9  Beta-2-Glycoprotein I IgA     0 -  25 GPI IgA units <9  Anticardiolipin Ab,IgG,Qn     0 - 14 GPL U/mL <9  Anticardiolipin Ab,IgM,Qn     0 - 12 MPL U/mL <9  Anticardiolipin Ab,IgA,Qn     0 - 11 APL U/mL <9  Hemoglobin A1C     4.8 - 5.6 % 5.8 (H)  Mean Plasma Glucose      120  Recommendations-F5LEID:      Comment  Comment      Comment  Recommendations-PTGENE:      Comment  Additional Information      Comment  Antithrombin Activity     75 - 120 % 119  Protein C-Functional     73 - 180 % 110  Protein C, Total     60 - 150 % 82  Protein S-Functional     63 - 140 % 109  Protein S, Total     60 - 150 % 117  Homocysteine     0.0 - 15.0 umol/L 11.6  ANA Ab, IFA      Negative  Sickle Cell Screen     Negative Positive (A)  Sed Rate     0 - 16 mm/hr 1  CRP     <1.0 mg/dL 0.8    Assessment: As you may recall, he is a 40 y.o. African American male without significant PMH admitted on 04/19/15 for small acute right MCA cortical infarct as well as chronic right parietal cortical infarct. MRA, CUS, TTE and LE venous doppler negative. TCD bubble study showed [name] at rest and [name] with valsalva. TEE also showed PFO but on other SOE. LDL 136 and A1C 5.8. Hypercoagulable work  up showed only positive sickle cell screening, likely sickle cell trait. He was discharged with ASA and lipitor and outpt follow up with Dr. Burt Knack and had PFO closure 05/28/15. On DAPT without side effects. Had 6 months post op TCD bubble study showed no HIT at rest and [name] with valsalva, much improved from prior to procedure. He will follow Dr. Burt Knack in 2 weeks with repeat TTE. Complain of intermittent left skull, face and perioral tingling after procedure or tests, consistent with anxiety.   Plan:  - continue ASA and plavix for now and follow up with Dr. Burt Knack in 2 weeks. - healthy diet and regular exercise - Follow up with your primary care physician for stroke risk factor modification. Recommend maintain blood pressure goal <130/80, diabetes with hemoglobin A1c goal below 6.5% and lipids with LDL cholesterol goal below 70 mg/dL.  - relaxation and de-stress and cope with stress - check BP regularly - follow up in 6 month.  I spent more than 25 minutes of face to face time with the patient. Greater than 50% of time was spent in counseling and coordination of care. We discussed anxiety management, medication complaince, and follow up with Dr. Burt Knack.    No orders of the defined types were placed in this encounter.   Meds ordered this encounter  Medications  . DISCONTD: naproxen (NAPROSYN) 500 MG tablet    Sig: take 1 tablet by mouth twice a day with food for 2 weeks then if needed    Refill:  0    Patient Instructions  - continue ASA and plavix for now and follow up with Dr. Burt Knack in 2 weeks. - healthy diet and regular exercise - Follow up with your primary care physician for stroke risk factor modification. Recommend maintain blood pressure goal <130/80, diabetes with  hemoglobin A1c goal below 6.5% and lipids with LDL cholesterol goal below 70 mg/dL.  - relaxation and de-stress and cope with stress - check BP regularly - follow up in 6 month.   Rosalin Hawking, MD PhD [facility] [address], [place], [place] [address] [phone]

## 2015-12-02 ENCOUNTER — Telehealth: Payer: Self-pay

## 2015-12-02 NOTE — Telephone Encounter (Signed)
Rn call Dr. Sherren Mocha office at [facility]. Rn stated pt was sent her for a bubble study by Dr. Burt Knack office. William(sfaff) stated to fax results to [phone], Attention Lauren(RN). Bubble Study fax to [phone].

## 2015-12-03 ENCOUNTER — Other Ambulatory Visit: Payer: Self-pay

## 2015-12-03 DIAGNOSIS — Q2112 Patent foramen ovale: Secondary | ICD-10-CM

## 2015-12-03 DIAGNOSIS — Q211 Atrial septal defect: Secondary | ICD-10-CM

## 2015-12-12 ENCOUNTER — Other Ambulatory Visit (HOSPITAL_COMMUNITY): Payer: BLUE CROSS/BLUE SHIELD

## 2015-12-24 ENCOUNTER — Other Ambulatory Visit (HOSPITAL_COMMUNITY): Payer: BLUE CROSS/BLUE SHIELD

## 2015-12-26 ENCOUNTER — Ambulatory Visit (INDEPENDENT_AMBULATORY_CARE_PROVIDER_SITE_OTHER): Payer: BLUE CROSS/BLUE SHIELD | Admitting: Cardiovascular Disease

## 2015-12-26 ENCOUNTER — Encounter: Payer: Self-pay | Admitting: Cardiovascular Disease

## 2015-12-26 VITALS — BP 116/76 | HR 52 | Ht 70.0 in | Wt 182.6 lb

## 2015-12-26 DIAGNOSIS — Q211 Atrial septal defect: Secondary | ICD-10-CM | POA: Diagnosis not present

## 2015-12-26 DIAGNOSIS — Q2112 Patent foramen ovale: Secondary | ICD-10-CM

## 2015-12-26 NOTE — Progress Notes (Signed)
Cardiology Office Note Date:  12/26/2015   ID:  Daniel Schneider, DOB 01/22/1976, MRN [id number]  PCP:  Dorena Dew, FNP  Cardiologist:  Sherren Mocha, MD    Chief Complaint  Patient presents with  . Patent Daniel Schneider     History of Present Illness: Daniel Schneider is a 40 y.o. male who presents for Follow-up evaluation. The patient was last seen 06/23/2015. He presented with a cryptogenic stroke in March 2017. TEE demonstrated PFO and transcranial Doppler study demonstrated Schneider 2 shunt at rest and Schneider 3 right to left shunt with Valsalva. He underwent transcatheter PFO closure 05/27/2015 with a 25 mm Amplatzer PFO occluder without complication.  The patient presents today for follow-up evaluation. He has discontinued his statin drug because of a multitude of side effects. He has stopped taking Plavix on a regular basis because he feels that is contributed to perioral numbness. He's had no chest pain, heart palpitations, or shortness of breath. He has no other complaints today. He recently underwent a repeat transcranial Doppler which demonstrated Daniel Schneider 0 shunt at rest and Schneider 2 right to left shunt with Valsalva.   Past Medical History:  Diagnosis Date  . Cryptogenic stroke ([place]) 05/2015   [name] 05/27/2015; left hand weaker since (05/26/2015)  . PFO (patent foramen ovale)    a. 05/27/2015: s/p PFO closure w/ 90mm PFO occluder   . Sickle cell trait [facility])     Past Surgical History:  Procedure Laterality Date  . ASD AND VSD REPAIR  05/27/2015   [name] 05/27/2015  . CARDIAC CATHETERIZATION N/A 05/27/2015   Procedure: ASD/VSD Closure;  Surgeon: Sherren Mocha, MD;  Location: [place] CV [facility];  Service: Cardiovascular;  Laterality: N/A;  . TEE WITHOUT CARDIOVERSION N/A 04/21/2015   Procedure: TRANSESOPHAGEAL ECHOCARDIOGRAM (TEE);  Surgeon: Sueanne Margarita, MD;  Location: [facility] ENDOSCOPY;  Service: Cardiovascular;  Laterality: N/A;    Current Outpatient  Prescriptions  Medication Sig Dispense Refill  . aspirin 81 MG chewable tablet Chew 1 tablet (81 mg total) by mouth daily.    . clopidogrel (PLAVIX) 75 MG tablet Take 1 tablet (75 mg total) by mouth daily. 90 tablet 3   No current facility-administered medications for this visit.     Allergies:   Patient has no known allergies.   Social History:  The patient  reports that he has never smoked. He has never used smokeless tobacco. He reports that he does not drink alcohol or use drugs.   Family History:  The patient's  family history includes Gout in his paternal grandfather; Healthy in his father and mother; Heart disease in his paternal grandfather; Sickle cell anemia in his maternal grandmother.    ROS:  Please see the history of present illness.  All other systems are reviewed and negative.    PHYSICAL EXAM: VS:  BP 116/76   Pulse (!) 52   Ht 5\' 10"  (1.778 m)   Wt 182 lb 9.6 oz (82.8 kg)   BMI 26.20 kg/m  , BMI Body mass index is 26.2 kg/m. GEN: Well nourished, well developed, in no acute distress  HEENT: normal  Neck: no JVD, no masses. No carotid bruits Cardiac: RRR without murmur or gallop                Respiratory:  clear to auscultation bilaterally, normal work of breathing GI: soft, nontender, nondistended, + BS MS: no deformity or atrophy  Ext: no pretibial edema, pedal pulses 2+= bilaterally Skin: warm and  dry, no rash Neuro:  Strength and sensation are intact Psych: euthymic mood, full affect  EKG:  EKG is ordered today. The ekg ordered today shows sinus bradycardia 52 bpm, first-degree block, otherwise within normal limits.  Recent Labs: 04/19/2015: ALT 25 05/27/2015: BUN 15; Creatinine, Ser 1.53; Potassium 4.4; Sodium 140 05/28/2015: Hemoglobin 13.7; Platelets 219   Lipid Panel     Component Value Date/Time   CHOL 185 04/20/2015 0531   TRIG 59 04/20/2015 0531   HDL 37 (L) 04/20/2015 0531   CHOLHDL 5.0 04/20/2015 0531   VLDL 12 04/20/2015 0531   LDLCALC  136 (H) 04/20/2015 0531      Wt Readings from Last 3 Encounters:  12/26/15 182 lb 9.6 oz (82.8 kg)  11/28/15 181 lb (82.1 kg)  06/23/15 183 lb 9.6 oz (83.3 kg)     ASSESSMENT AND PLAN: PFO status post transcatheter closure: The patient is now greater than 6 months out from his procedure. He can discontinue Plavix. His transcranial Doppler demonstrates decreased right to left shunting. He will have an echocardiogram with bubble study and I will plan to see him back in one year. He should remain on long-term low-dose aspirin. Advised that he has no cardiac restrictions and can do all of his normal activities without limitation.  Current medicines are reviewed with the patient today.  The patient does not have concerns regarding medicines.  Labs/ tests ordered today include:  No orders of the defined types were placed in this encounter.  Disposition:   FU one year  Signed, Sherren Mocha, MD  12/26/2015 11:26 AM    [facility] [place], [place], [place]  [address] Phone: [phone]; Fax: [phone]

## 2015-12-26 NOTE — Patient Instructions (Addendum)
Medication Instructions:  Your physician recommends that you continue on your current medications as directed. Please refer to the Current Medication list given to you today.  Labwork: No new orders.   Testing/Procedures: Keep your scheduled appointment for Echocardiogram with bubble study.   Follow-Up: Your physician wants you to follow-up in: 1 YEAR with Dr Burt Knack.  You will receive a reminder letter in the mail two months in advance. If you don't receive a letter, please call our office to schedule the follow-up appointment.   Any Other Special Instructions Will Be Listed Below (If Applicable).     If you need a refill on your cardiac medications before your next appointment, please call your pharmacy.

## 2015-12-31 ENCOUNTER — Ambulatory Visit (HOSPITAL_COMMUNITY): Payer: BLUE CROSS/BLUE SHIELD | Attending: Cardiology

## 2015-12-31 ENCOUNTER — Other Ambulatory Visit: Payer: Self-pay

## 2015-12-31 DIAGNOSIS — Q2112 Patent foramen ovale: Secondary | ICD-10-CM

## 2015-12-31 DIAGNOSIS — Q211 Atrial septal defect: Secondary | ICD-10-CM | POA: Diagnosis not present

## 2016-01-06 ENCOUNTER — Other Ambulatory Visit: Payer: Self-pay

## 2016-01-07 ENCOUNTER — Encounter (HOSPITAL_COMMUNITY): Payer: Self-pay | Admitting: Emergency Medicine

## 2016-01-07 ENCOUNTER — Ambulatory Visit (HOSPITAL_COMMUNITY)
Admission: EM | Admit: 2016-01-07 | Discharge: 2016-01-07 | Disposition: A | Discharge status: Home / Self Care | Payer: BLUE CROSS/BLUE SHIELD | Attending: Internal Medicine | Admitting: Internal Medicine

## 2016-01-07 DIAGNOSIS — L42 Pityriasis rosea: Secondary | ICD-10-CM

## 2016-01-07 MED ORDER — METHYLPREDNISOLONE 4 MG PO TBPK: ORAL_TABLET | ORAL | 0 refills | Status: DC

## 2016-01-07 MED ORDER — HYDROXYZINE HCL 25 MG PO TABS: 25.0000 mg | ORAL_TABLET | Freq: Four times a day (QID) | ORAL | 0 refills | Status: DC

## 2016-01-07 NOTE — ED Provider Notes (Signed)
CSN: [id number]     Arrival date & time 01/07/16  1213 History   First MD Initiated Contact with Patient 01/07/16 1324     Chief Complaint  Patient presents with  . Rash   (Consider location/radiation/quality/duration/timing/severity/associated sxs/prior Treatment) Patient has rash on arms, back, chest, and abdomen.   The history is provided by the patient.  Rash  Location:  Shoulder/arm and torso Torso rash location:  Upper back, lower back, R chest and L chest Quality: redness   Severity:  Moderate Onset quality:  Sudden Duration:  2 days Progression:  Worsening Relieved by:  Nothing Worsened by:  Nothing Ineffective treatments:  Antihistamines Associated symptoms: URI     Past Medical History:  Diagnosis Date  . Cryptogenic stroke ([place]) 05/2015   [name] 05/27/2015; left hand weaker since (05/26/2015)  . PFO (patent foramen ovale)    a. 05/27/2015: s/p PFO closure w/ 72mm PFO occluder   . Sickle cell trait [facility])    Past Surgical History:  Procedure Laterality Date  . ASD AND VSD REPAIR  05/27/2015   [name] 05/27/2015  . CARDIAC CATHETERIZATION N/A 05/27/2015   Procedure: ASD/VSD Closure;  Surgeon: Sherren Mocha, MD;  Location: [place] CV LAB;  Service: Cardiovascular;  Laterality: N/A;  . TEE WITHOUT CARDIOVERSION N/A 04/21/2015   Procedure: TRANSESOPHAGEAL ECHOCARDIOGRAM (TEE);  Surgeon: Sueanne Margarita, MD;  [facility] ENDOSCOPY;  Service: Cardiovascular;  Laterality: N/A;   Family History  Problem Relation Age of Onset  . Healthy Mother   . Healthy Father   . Sickle cell anemia Maternal Grandmother   . Heart disease Paternal Grandfather   . Gout Paternal Grandfather    Social History  Substance Use Topics  . Smoking status: Never Smoker  . Smokeless tobacco: Never Used  . Alcohol use No    Review of Systems  Constitutional: Negative.   HENT: Negative.   Eyes: Negative.   Respiratory: Negative.   Cardiovascular: Negative.   Endocrine: Negative.    Musculoskeletal: Negative.   Skin: Positive for rash.  Allergic/Immunologic: Negative.   Hematological: Negative.   Psychiatric/Behavioral: Negative.     Allergies  Patient has no known allergies.  Home Medications   Prior to Admission medications   Medication Sig Start Date End Date Taking? Authorizing Provider  aspirin 81 MG chewable tablet Chew 1 tablet (81 mg total) by mouth daily. 05/28/15  Yes Erma Heritage, PA  hydrOXYzine (ATARAX/VISTARIL) 25 MG tablet Take 1 tablet (25 mg total) by mouth every 6 (six) hours. 01/07/16   [name], FNP  methylPREDNISolone (MEDROL DOSEPAK) 4 MG TBPK tablet Take 6-5-4-3-2-1 po qd 01/07/16   [name], FNP   Meds Ordered and Administered this Visit  Medications - No data to display  BP 117/55 (BP Location: Left Arm)   Pulse 65   Temp 98.6 F (37 C) (Oral)   Resp 18   SpO2 100%  No data found.   Physical Exam  Constitutional: He appears well-developed and well-nourished.  HENT:  Head: Normocephalic and atraumatic.  Right Ear: External ear normal.  Left Ear: External ear normal.  Mouth/Throat: Oropharynx is clear and moist.  Eyes: Conjunctivae and EOM are normal. Pupils are equal, round, and reactive to light.  Neck: Normal range of motion. Neck supple.  Cardiovascular: Normal rate, regular rhythm and normal heart sounds.   Pulmonary/Chest: Effort normal and breath sounds normal.  Skin: Rash noted.  Plaque rash over chest, abdomen, back and arms No itching.  No  obvious herald patch.  Nursing note and vitals reviewed.   Urgent Care Course   Clinical Course     Procedures (including critical care time)  Labs Review Labs Reviewed - No data to display  Imaging Review No results found.   Visual Acuity Review  Right Eye Distance:   Left Eye Distance:   Bilateral Distance:    Right Eye Near:   Left Eye Near:    Bilateral Near:         MDM   1. Pityriasis rosea    Hydroxyzine 25mg  one po q 6  hours prn #12 Medrol dose pack 4 mg 6-5-4-3-2-1 po qd #21     [name], FNP 01/07/16 1359

## 2016-01-07 NOTE — ED Triage Notes (Addendum)
The patient presented to the [facility] with a complaint of a rash on his arms, back, chest and torso x 3 days. The patient reported using OTC benadryl.

## 2016-03-09 ENCOUNTER — Encounter (HOSPITAL_COMMUNITY): Payer: Self-pay | Admitting: Emergency Medicine

## 2016-03-09 ENCOUNTER — Emergency Department (HOSPITAL_COMMUNITY)
Admission: EM | Admit: 2016-03-09 | Discharge: 2016-03-10 | Disposition: A | Discharge status: Home / Self Care | Payer: BLUE CROSS/BLUE SHIELD | Attending: Emergency Medicine | Admitting: Emergency Medicine

## 2016-03-09 ENCOUNTER — Emergency Department (HOSPITAL_COMMUNITY): Payer: BLUE CROSS/BLUE SHIELD

## 2016-03-09 DIAGNOSIS — Z8673 Personal history of transient ischemic attack (TIA), and cerebral infarction without residual deficits: Secondary | ICD-10-CM | POA: Diagnosis not present

## 2016-03-09 DIAGNOSIS — R0789 Other chest pain: Secondary | ICD-10-CM | POA: Diagnosis not present

## 2016-03-09 DIAGNOSIS — R079 Chest pain, unspecified: Secondary | ICD-10-CM

## 2016-03-09 LAB — BASIC METABOLIC PANEL
Anion gap: 10 (ref 5–15)
BUN: 17 mg/dL (ref 6–20)
CALCIUM: 9.5 mg/dL (ref 8.9–10.3)
CO2: 25 mmol/L (ref 22–32)
CREATININE: 1.58 mg/dL — AB (ref 0.61–1.24)
Chloride: 103 mmol/L (ref 101–111)
GFR calc non Af Amer: 52 mL/min — ABNORMAL LOW (ref >60)
Glucose, Bld: 108 mg/dL — ABNORMAL HIGH (ref 65–99)
Potassium: 3.9 mmol/L (ref 3.5–5.1)
SODIUM: 138 mmol/L (ref 135–145)

## 2016-03-09 LAB — I-STAT TROPONIN, ED: TROPONIN I, POC: 0 ng/mL (ref 0.00–0.08)

## 2016-03-09 LAB — CBC
HCT: 42.7 % (ref 39.0–52.0)
Hemoglobin: 14.6 g/dL (ref 13.0–17.0)
MCH: 28.4 pg (ref 26.0–34.0)
MCHC: 34.2 g/dL (ref 30.0–36.0)
MCV: 83.1 fL (ref 78.0–100.0)
PLATELETS: 247 10*3/uL (ref 150–400)
RBC: 5.14 MIL/uL (ref 4.22–5.81)
RDW: 13.8 % (ref 11.5–15.5)
WBC: 6.6 10*3/uL (ref 4.0–10.5)

## 2016-03-09 NOTE — ED Triage Notes (Signed)
Pt states he was having 8/10 left cp and sob, denies any actual cp at triage time, denies any nausea or vomiting.

## 2016-03-10 LAB — I-STAT TROPONIN, ED: Troponin i, poc: 0 ng/mL (ref 0.00–0.08)

## 2016-03-10 NOTE — ED Provider Notes (Signed)
[facility] DEPT Provider Note   CSN: [id number] Arrival date & time: 03/09/16  2039     History   Chief Complaint Chief Complaint  Patient presents with  . Chest Pain  . Shortness of Breath    HPI Daniel Schneider is a 41 y.o. male.  HPI Patient presents to the emergency department with a 15 minute episode of chest pain that occurred around 850 this evening.  The patient states that he was standing at [place] talking with someone when he had sharp chest discomfort that lasted for about 15 minutes.  He states that he did not take any medications prior to arrival.  He states that nothing seemed to make the condition better or worse.  He denies any exertional symptoms or diaphoresis.  The patient states that by the time he got to the emergency department that the pain had subsidedThe patient denies  shortness of breath, headache,blurred vision, neck pain, fever, cough, weakness, numbness, dizziness, anorexia, edema, abdominal pain, nausea, vomiting, diarrhea, rash, back pain, dysuria, hematemesis, bloody stool, near syncope, or syncope. Past Medical History:  Diagnosis Date  . Cryptogenic stroke ([place]) 05/2015   [name] 05/27/2015; left hand weaker since (05/26/2015)  . PFO (patent foramen ovale)    a. 05/27/2015: s/p PFO closure w/ 31mm PFO occluder   . Sickle cell trait [facility])     Patient Active Problem List   Diagnosis Date Noted  . Cerebrovascular accident (CVA) due to embolism of right middle cerebral artery ([place]) 05/30/2015  . Prediabetes 05/27/2015  . Sickle cell trait ([place]) 05/26/2015  . PFO (patent foramen ovale)   . Acute CVA (cerebrovascular accident) ([place]) 04/20/2015  . Stroke ([place])   . HLD (hyperlipidemia)     Past Surgical History:  Procedure Laterality Date  . ASD AND VSD REPAIR  05/27/2015   [name] 05/27/2015  . CARDIAC CATHETERIZATION N/A 05/27/2015   Procedure: ASD/VSD Closure;  Surgeon: Sherren Mocha, MD;  Location: [facility];  Service:  Cardiovascular;  Laterality: N/A;  . TEE WITHOUT CARDIOVERSION N/A 04/21/2015   Procedure: TRANSESOPHAGEAL ECHOCARDIOGRAM (TEE);  Surgeon: Sueanne Margarita, MD;  Location: [facility] ENDOSCOPY;  Service: Cardiovascular;  Laterality: N/A;       Home Medications    Prior to Admission medications   Medication Sig Start Date End Date Taking? Authorizing Provider  Phenylephrine-Pheniramine-DM [facility] COLD & COUGH) 11-28-18 MG PACK Take 1 Package by mouth daily as needed (cold).   Yes Historical Provider, MD    Family History Family History  Problem Relation Age of Onset  . Healthy Mother   . Healthy Father   . Sickle cell anemia Maternal Grandmother   . Heart disease Paternal Grandfather   . Gout Paternal Grandfather     Social History Social History  Substance Use Topics  . Smoking status: Never Smoker  . Smokeless tobacco: Never Used  . Alcohol use No     Allergies   Patient has no known allergies.   Review of Systems Review of Systems  All other systems negative except as documented in the HPI. All pertinent positives and negatives as reviewed in the HPI. Physical Exam Updated Vital Signs BP 127/70   Pulse 66   Temp 98.9 F (37.2 C) (Oral)   Resp 14   Ht 5\' 10"  (1.778 m)   Wt 82.6 kg   SpO2 98%   BMI 26.11 kg/m   Physical Exam  Constitutional: He is oriented to person, place, and time. He appears well-developed and  well-nourished. No distress.  HENT:  Head: Normocephalic and atraumatic.  Mouth/Throat: Oropharynx is clear and moist.  Eyes: Pupils are equal, round, and reactive to light.  Neck: Normal range of motion. Neck supple.  Cardiovascular: Normal rate, regular rhythm and normal heart sounds.  Exam reveals no gallop and no friction rub.   No murmur heard. Pulmonary/Chest: Effort normal and breath sounds normal. No respiratory distress. He has no wheezes. He exhibits no tenderness.  Abdominal: Soft. Bowel sounds are normal. He exhibits no distension. There is  no tenderness.  Neurological: He is alert and oriented to person, place, and time. He exhibits normal muscle tone. Coordination normal.  Skin: Skin is warm and dry. No rash noted. No erythema.  Psychiatric: He has a normal mood and affect. His behavior is normal.  Nursing note and vitals reviewed.    ED Treatments / Results  Labs (all labs ordered are listed, but only abnormal results are displayed) Labs Reviewed  BASIC METABOLIC PANEL - Abnormal; Notable for the following:       Result Value   Glucose, Bld 108 (*)    Creatinine, Ser 1.58 (*)    GFR calc non Af Amer 52 (*)    All other components within normal limits  CBC  I-STAT TROPOININ, ED  I-STAT TROPOININ, ED    EKG  EKG Interpretation  Date/Time:  Tuesday March 09 2016 20:45:55 EST Ventricular Rate:  61 PR Interval:  196 QRS Duration: 84 QT Interval:  390 QTC Calculation: 392 R Axis:   30 Text Interpretation:  Normal sinus rhythm Normal ECG Confirmed by Christy Gentles  MD, DONALD ([id number]) on 03/09/2016 11:28:04 PM       Radiology Dg Chest 2 View  Result Date: 03/09/2016 CLINICAL DATA:  Acute onset of generalized chest pain and shortness of breath. Initial encounter. EXAM: CHEST  2 VIEW COMPARISON:  Chest radiograph performed 04/19/2015 FINDINGS: The lungs are well-aerated and clear. There is no evidence of focal opacification, pleural effusion or pneumothorax. The heart is normal in size; the mediastinal contour is within normal limits. A small metallic device is noted overlying the right side of the mediastinum. No acute osseous abnormalities are seen. IMPRESSION: No acute cardiopulmonary process seen. Electronically Signed   By: Garald Balding M.D.   On: 03/09/2016 21:32    Procedures Procedures (including critical care time)  Medications Ordered in ED Medications - No data to display   Initial Impression / Assessment and Plan / ED Course  I have reviewed the triage vital signs and the nursing  notes.  Pertinent labs & imaging results that were available during my care of the patient were reviewed by me and considered in my medical decision making (see chart for details).   patient's chest pain seems atypical, due to the fact that he had one episode of sharp chest pain.  Patient's EKG and cardiac enzyme testing did not yield any significant abnormalities.  Patient is advised to follow-up with his primary doctor, told to return here as needed.  Was monitored over several hours in the emergency department with no chest pain  Final Clinical Impressions(s) / ED Diagnoses   Final diagnoses:  Chest pain, unspecified type    New Prescriptions Discharge Medication List as of 03/10/2016  2:00 AM       Dalia Heading, PA-C 03/10/16 [id number]    Ripley Fraise, MD 03/11/16 [id number]

## 2016-03-10 NOTE — Discharge Instructions (Signed)
Return here as needed.  Follow-up with your primary care doctor.  Your testing here tonight did not show any significant abnormality

## 2016-05-24 ENCOUNTER — Ambulatory Visit: Payer: BLUE CROSS/BLUE SHIELD | Admitting: Neurology

## 2016-05-24 ENCOUNTER — Ambulatory Visit: Payer: Self-pay | Admitting: Neurology

## 2016-05-24 ENCOUNTER — Telehealth: Payer: Self-pay | Admitting: *Deleted

## 2016-05-24 NOTE — Telephone Encounter (Signed)
Called and spoke with pt. Advised appt cx today. Dr Erlinda Hong nurse will call back to r/s appt. Apologized for any inconvenience . He verbalized understanding and appreciation for call.

## 2016-05-24 NOTE — Telephone Encounter (Signed)
Called pt father back. R/s pt to same day at 1pm, check in 1230pm. Father stated pt agreeable to this.
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
## 2016-05-25 NOTE — Telephone Encounter (Signed)
Unable to leave vm for patient. PT needs to r/s with Hoyle Sauer NP or Dr. Erlinda Hong. VM is not set up.

## 2016-05-25 NOTE — Telephone Encounter (Signed)
If pt clinically stable, he can be seen by me or carolyn in 6 months. Thanks.   Rosalin Hawking, MD PhD Stroke Neurology 05/25/2016 1:31 PM

## 2016-05-26 NOTE — Telephone Encounter (Signed)
Patient repeated appt back to Rn.
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
## 2016-08-03 ENCOUNTER — Encounter (INDEPENDENT_AMBULATORY_CARE_PROVIDER_SITE_OTHER): Payer: Self-pay

## 2016-08-03 ENCOUNTER — Encounter: Payer: Self-pay | Admitting: Neurology

## 2016-08-03 ENCOUNTER — Ambulatory Visit (INDEPENDENT_AMBULATORY_CARE_PROVIDER_SITE_OTHER): Payer: BLUE CROSS/BLUE SHIELD | Admitting: Neurology

## 2016-08-03 VITALS — BP 121/75 | HR 58 | Wt 189.2 lb

## 2016-08-03 DIAGNOSIS — I63411 Cerebral infarction due to embolism of right middle cerebral artery: Secondary | ICD-10-CM

## 2016-08-03 DIAGNOSIS — F419 Anxiety disorder, unspecified: Secondary | ICD-10-CM | POA: Diagnosis not present

## 2016-08-03 DIAGNOSIS — Q211 Atrial septal defect: Secondary | ICD-10-CM | POA: Diagnosis not present

## 2016-08-03 DIAGNOSIS — Q2112 Patent foramen ovale: Secondary | ICD-10-CM

## 2016-08-03 NOTE — Progress Notes (Signed)
STROKE NEUROLOGY FOLLOW UP NOTE  NAME: Daniel Schneider DOB: 05-Aug-1975  REASON FOR VISIT: stroke follow up HISTORY FROM: pt and chart  Today we had the pleasure of seeing Daniel Schneider in follow-up at our Neurology Clinic. Pt was accompanied by cousin.   History Summary Daniel Schneider is a 41 y.o. male with no significant PMH admitted on 04/19/15 for numbness involving his left arm and face and speech difficulties. MRI showed small acute right MCA cortical infarct as well as chronic right parietal cortical infarct. MRA, CUS, TTE and LE venous doppler negative. TCD bubble study showed [name] at rest and [name] with valsalva. TEE also showed PFO but on other SOE. LDL 136 and A1C 5.8. Hypercoagulable work up showed only positive sickle cell screening, likely sickle cell trait. He was discharged with ASA and lipitor and outpt follow up with Dr. Burt Knack for consideration of PFO closure.  05/29/15 follow up - the patient has been doing well. Followed up with Dr. Burt Knack and had PFO closure 05/28/15. Today, he came in still complain of right groin area sourness but able to walk with limping. BP 96/64. Otherwise, no complains.    11/28/15 follow up - the pt has been doing well. Still on DAPT. Had TCD bubble study 2 days ago showed no HIT at rest and spencer degree II with valsalva. This is better than 7 months ago when TCD bubble study showed spencer degree II at rest and III with valsalva.  He complains of left top of head tingling radiating to left face as well as periorally since yesterday. He also had brief episode like that after the bubble study 2 days ago. No HA, neck pain, no hx of migraine. On further questioning, he also had similar feeling last admission with PFO closure. His feeling likely due to anxiety and he agrees.   Interval History During the interval time, the pt has been doing well. He had TTE with bubble study with Dr. Burt Knack in 12/2015 and did not see any bubbles  crossing at the atrial septal level and very few bubbles in the left heart fill late, considered a favorable result post PFO closure.   Pt still complains of intermittent tingling at the top of head and sometimes goes to left face. He had ED visit in 02/2016 for atypical chest pain, work up negative. 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 He admitted a lot of stress on daily basis. BP 121/75. On ASA at home.   REVIEW OF SYSTEMS: Full 14 system review of systems performed and notable only for those listed below and in HPI above, all others are negative:  Constitutional:   Cardiovascular:  Ear/Nose/Throat:   Skin:  Eyes:   Respiratory:   Gastroitestinal:   Genitourinary:  Hematology/Lymphatic:   Endocrine:  Musculoskeletal:   Allergy/Immunology:   Neurological:  Numbness, tremors Psychiatric:  Sleep:   The following represents the patient's updated allergies and side effects list: No Known Allergies  The neurologically relevant items on the patient's problem list were reviewed on today's visit.  Neurologic Examination  A problem focused neurological exam (12 or more points of the single system neurologic examination, vital signs counts as 1 point, cranial nerves count for 8 points) was performed.  Blood pressure 121/75, pulse (!) 58, weight 189 lb 3.2 oz (85.8 kg).  General - Well nourished, well developed, in no  apparent distress.  Ophthalmologic - Sharp disc margins OU.  Cardiovascular - Regular rate and rhythm with no murmur.  Mental Status -  Level of arousal and orientation to time, place, and person were intact. Language including expression, naming, repetition, comprehension was assessed and found intact. Attention span and concentration were normal. Recent and remote memory were intact. Fund of Knowledge was assessed and was intact.  Cranial Nerves II - XII - II - Visual field intact  OU. III, IV, VI - Extraocular movements intact. V - Facial sensation intact bilaterally. VII - Facial movement intact bilaterally. VIII - Hearing & vestibular intact bilaterally. X - Palate elevates symmetrically. XI - Chin turning & shoulder shrug intact bilaterally. XII - Tongue protrusion intact.  Motor Strength - The patient's strength was normal in all extremities and pronator drift was absent.  Bulk was normal and fasciculations were absent.   Motor Tone - Muscle tone was assessed at the neck and appendages and was normal.  Reflexes - The patient's reflexes were 1+ in all extremities and he had no pathological reflexes.  Sensory - Light touch, temperature/pinprick were assessed and were normal.    Coordination - The patient had normal movements in the hands and feet with no ataxia or dysmetria.  Tremor was absent.  Gait and Station - normal   Data reviewed: I personally reviewed the images and agree with the radiology interpretations.  Ct Head Wo Contrast 04/19/2015  Normal head CT.   MRI / MRA HEAD 04/20/2015 1. Small acute right MCA territory frontal lobe cortical infarct. 2. Small chronic right parietal cortical infarct. 3. Normal variant head MRA without evidence of major branch occlusion or significant stenosis.  CUS - Bilateral: 1-39% ICA stenosis. Vertebral artery flow is antegrade.  LE venous doppler - no DVT  TTE  - Left ventricle: The cavity size was normal. Wall thickness was normal. Systolic function was normal. The estimated ejection fraction was in the range of 50% to 55%. Wall motion was normal; there were no regional wall motion abnormalities. Left ventricular diastolic function parameters were normal. Mpressions: No cardiac source of emboli was indentified.  TEE - Normal LV size and function Normal RV size and function Normal RA Normal LA Normal TV Normal PV with trivial PR Normal MV with trivial MR Normal trileaflet AV with trivial  AR Normal interatrial septum by colorflow dopper but by agitated saline contrast there was evidence of microbubbles crossing the septum within 2 cardiac cycles c/w PFO with right to left shunt. Normal thoracic and ascending aorta.  TCD bubble study - at rest PFO spencer degree II, with valsalva spencer degree III  TCD bubble study 11/28/15 - at rest no HIT, with valsalva spencer degree II.   TTE with bubble study - Left ventricle: Systolic function was normal. The estimated   ejection fraction was in the range of 55% to 60%. Wall motion was   normal; there were no regional wall motion abnormalities. - Atrial septum: An Amplatzer closure device was present. There is   a very small amount of bubble cross over from right to left.  Component     Latest Ref Rng 04/20/2015  Cholesterol     0 - 200 mg/dL 185  Triglycerides     <150 mg/dL 59  HDL Cholesterol     >40 mg/dL 37 (L)  Total CHOL/HDL Ratio      5.0  VLDL     0 - 40 mg/dL 12  LDL (calc)  0 - 99 mg/dL 136 (H)  Opiates     NONE DETECTED NONE DETECTED  COCAINE     NONE DETECTED NONE DETECTED  Benzodiazepines     NONE DETECTED NONE DETECTED  Amphetamines     NONE DETECTED NONE DETECTED  Tetrahydrocannabinol     NONE DETECTED NONE DETECTED  Barbiturates     NONE DETECTED NONE DETECTED  Alpha galactosidase, serum     28.0 - 80.0 nmol/hr/mg prt 55.9  Interpretation      Comment  Director Review      Comment  Methodology      Comment  PTT Lupus Anticoagulant     0.0 - 43.6 sec 36.8  DRVVT     0.0 - 44.0 sec 42.8  Lupus Anticoag Interp      Comment:  Beta-2 Glycoprotein I Ab, IgG     0 - 20 GPI IgG units <9  Beta-2-Glycoprotein I IgM     0 - 32 GPI IgM units <9  Beta-2-Glycoprotein I IgA     0 - 25 GPI IgA units <9  Anticardiolipin Ab,IgG,Qn     0 - 14 GPL U/mL <9  Anticardiolipin Ab,IgM,Qn     0 - 12 MPL U/mL <9  Anticardiolipin Ab,IgA,Qn     0 - 11 APL U/mL <9  Hemoglobin A1C     4.8 - 5.6 % 5.8 (H)   Mean Plasma Glucose      120  Recommendations-F5LEID:      Comment  Comment      Comment  Recommendations-PTGENE:      Comment  Additional Information      Comment  Antithrombin Activity     75 - 120 % 119  Protein C-Functional     73 - 180 % 110  Protein C, Total     60 - 150 % 82  Protein S-Functional     63 - 140 % 109  Protein S, Total     60 - 150 % 117  Homocysteine     0.0 - 15.0 umol/L 11.6  ANA Ab, IFA      Negative  Sickle Cell Screen     Negative Positive (A)  Sed Rate     0 - 16 mm/hr 1  CRP     <1.0 mg/dL 0.8    Assessment: As you may recall, he is a 41 y.o. African American male without significant PMH admitted on 04/19/15 for small acute right MCA cortical infarct as well as chronic right parietal cortical infarct. MRA, CUS, TTE and LE venous doppler negative. TCD bubble study showed [name] at rest and [name] with valsalva. TEE also showed PFO but on other SOE. LDL 136 and A1C 5.8. Hypercoagulable work up showed only positive sickle cell screening, likely sickle cell trait. He was discharged with ASA and lipitor and outpt follow up with Dr. Burt Knack and had PFO closure 05/28/15. On DAPT without side effects. Had 6 months post op TCD bubble study showed no HIT at rest and [name] with valsalva, much improved from prior to procedure. Follow Dr. Burt Knack in 12/2015 had TTE with bubble showed very small amount of bubble late in the left atrium, considered favorable outcome of PFO closure. Complain of intermittent left skull, face and perioral tingling most likely due to stress and anxiety, but will do CT head to rule out other underlying etiology.   Plan:  - continue ASA for stroke prevention - CT head for HA and numbness feeling  to rule out underlying causes - healthy diet and regular exercise - Follow up with your primary care physician for stroke risk factor modification. Recommend maintain blood pressure goal <130/80, diabetes with hemoglobin A1c goal  below 6.5% and lipids with LDL cholesterol goal below 70 mg/dL.  - relaxation and de-stress and cope with stress - check BP regularly - follow up as needed.  I spent more than 25 minutes of face to face time with the patient. Greater than 50% of time was spent in counseling and coordination of care. We discussed anxiety management, and CT head ruling out other causes.    Orders Placed This Encounter  Procedures  . CT HEAD WO CONTRAST    Standing Status:   Future    Standing Expiration Date:   11/03/2017    Order Specific Question:   Reason for Exam (SYMPTOM  OR DIAGNOSIS REQUIRED)    Answer:   headache with numbness feeling    Order Specific Question:   Preferred imaging location?    Answer:   [id number]    Order Specific Question:   Radiology Contrast Protocol - do NOT remove file path    Answer:   [email or web address]    Meds ordered this encounter  Medications  . aspirin 81 MG tablet    Sig: Take 81 mg by mouth daily.    Patient Instructions  - continue ASA for stroke prevention - will do CT head for HA and numbness feeling to rule out underlying causes - healthy diet and regular exercise - Follow up with your primary care physician for stroke risk factor modification. Recommend maintain blood pressure goal <130/80, diabetes with hemoglobin A1c goal below 6.5% and lipids with LDL cholesterol goal below 70 mg/dL.  - relaxation and de-stress and cope with stress - check BP regularly - follow up as needed.   Rosalin Hawking, MD PhD [facility] [address], [place], [place] [address] [phone]

## 2016-08-03 NOTE — Patient Instructions (Addendum)
-   continue ASA for stroke prevention - will do CT head for HA and numbness feeling to rule out underlying causes - healthy diet and regular exercise - Follow up with your primary care physician for stroke risk factor modification. Recommend maintain blood pressure goal <130/80, diabetes with hemoglobin A1c goal below 6.5% and lipids with LDL cholesterol goal below 70 mg/dL.  - relaxation and de-stress and cope with stress - check BP regularly - follow up as needed.

## 2016-08-09 ENCOUNTER — Ambulatory Visit
Admission: RE | Admit: 2016-08-09 | Discharge: 2016-08-09 | Disposition: A | Discharge status: Home / Self Care | Payer: BLUE CROSS/BLUE SHIELD | Source: Ambulatory Visit | Attending: Neurology | Admitting: Neurology

## 2016-08-09 DIAGNOSIS — I63411 Cerebral infarction due to embolism of right middle cerebral artery: Secondary | ICD-10-CM

## 2016-08-12 ENCOUNTER — Telehealth: Payer: Self-pay

## 2016-08-12 NOTE — Telephone Encounter (Signed)
Rn call second time pt did not pick up. Cannot leave message for pt to call back. Rn was calling about Ct head results.

## 2016-08-12 NOTE — Telephone Encounter (Signed)
Unable to leave vm for patient to call back. RN was calling about CT scan results. VM not set up.

## 2016-08-12 NOTE — Telephone Encounter (Signed)
-----   Message from Rosalin Hawking, MD sent at 08/10/2016  1:37 PM EDT ----- Could you please let the patient know that the CT head test done yesterday showed no new stroke, no acute changes. Please continue current treatment. Thanks.  Rosalin Hawking, MD PhD Stroke Neurology 08/10/2016 1:37 PM

## 2016-08-16 NOTE — Telephone Encounter (Signed)
Rn call patient that CT scan showed no new stroke or acute changes. Continue treatment plan.Pt verbalized understanding.

## 2016-08-18 ENCOUNTER — Ambulatory Visit: Payer: Self-pay | Admitting: Neurology

## 2016-09-12 ENCOUNTER — Ambulatory Visit (INDEPENDENT_AMBULATORY_CARE_PROVIDER_SITE_OTHER): Payer: BLUE CROSS/BLUE SHIELD

## 2016-09-12 ENCOUNTER — Encounter (HOSPITAL_COMMUNITY): Payer: Self-pay | Admitting: *Deleted

## 2016-09-12 ENCOUNTER — Ambulatory Visit (HOSPITAL_COMMUNITY)
Admission: EM | Admit: 2016-09-12 | Discharge: 2016-09-12 | Disposition: A | Discharge status: Home / Self Care | Payer: BLUE CROSS/BLUE SHIELD | Attending: Family Medicine | Admitting: Family Medicine

## 2016-09-12 DIAGNOSIS — M25521 Pain in right elbow: Secondary | ICD-10-CM

## 2016-09-12 DIAGNOSIS — S40021A Contusion of right upper arm, initial encounter: Secondary | ICD-10-CM

## 2016-09-12 MED ORDER — HYDROCODONE-ACETAMINOPHEN 5-325 MG PO TABS: 2.0000 | ORAL_TABLET | ORAL | 0 refills | Status: DC | PRN

## 2016-09-12 MED ORDER — ACETAMINOPHEN 325 MG PO TABS
ORAL_TABLET | ORAL | Status: AC
  Filled 2016-09-12: qty 3

## 2016-09-12 MED ORDER — ACETAMINOPHEN 325 MG PO TABS
975.0000 mg | ORAL_TABLET | Freq: Once | ORAL | Status: AC
  Administered 2016-09-12: 975 mg via ORAL
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
## 2017-01-26 ENCOUNTER — Telehealth: Payer: Self-pay | Admitting: Cardiovascular Disease

## 2017-01-26 ENCOUNTER — Telehealth: Payer: Self-pay

## 2017-01-26 NOTE — Telephone Encounter (Signed)
   [facility] Pre-operative Risk Assessment    Request for surgical clearance:  1. What type of surgery is being performed? Left shoulder RCR   2. When is this surgery scheduled? Pending clearance   3. Are there any medications that need to be held prior to surgery and how long? ASA   4. Practice name and name of physician performing surgery?  Orthopaed[facility] Dr. Theda Sers   5. What is your office phone and fax number? Phone [phone] Fax [phone] [place]  6. Anesthesia type (None, local, MAC, general) ? Not known   Daniel Schneider 01/26/2017, 4:19 PM  _________________________________________________________________   (provider comments below)

## 2017-01-26 NOTE — Telephone Encounter (Signed)
Walk In Pt Form-Clearance form Dropped off placed in Triage Box to be addressed.

## 2017-01-28 NOTE — Telephone Encounter (Signed)
[facility] per Cecilie Kicks, NP who d/w Dr. Burt Knack pt is stable from cardiac standpoint. However did advise per NP in regard to question about the ASA, that this should be addressed with pt's Neurologist Dr. Erlinda Hong. [facility] verbalized understanding and thanked me for our help.

## 2017-01-28 NOTE — Telephone Encounter (Signed)
Discussed with Dr. Burt Knack ok to be off ASA.  From PFO closure and from cardiology pt is stable.    Primary Cardiologist: No primary care provider on file.  Chart reviewed as part of pre-operative protocol coverage. Given past medical history and time since last visit, based on ACC/AHA guidelines, Daniel Schneider would be at acceptable risk for the planned procedure without further cardiovascular testing.  He may be off ASA as needed from cardiology view point but should check with Dr. Erlinda Hong his neurologist who placed him on asa for CVA.    I will route this recommendation to the requesting party via Epic fax function and remove from pre-op pool.  Please call with questions.  Cecilie Kicks, NP 01/28/2017, 2:24 PM     .

## 2017-01-28 NOTE — Telephone Encounter (Signed)
Dr. Burt Knack how long can pt be off ASA for shoulder surgery if he can?  I will let ortho know neuro should weigh in with hx of CVA.

## 2017-01-28 NOTE — Telephone Encounter (Signed)
Please let Dr. Theda Sers office know they should check with neuro about ASA - he is on for CVA.  Thanks.

## 2017-01-28 NOTE — Telephone Encounter (Signed)
He's ok to hold the ASA for surgery. Risk of CV problems is very low. thx

## 2017-02-02 NOTE — Telephone Encounter (Signed)
Faxed to provided number  

## 2017-02-23 ENCOUNTER — Telehealth: Payer: Self-pay

## 2017-02-23 NOTE — Telephone Encounter (Signed)
Fax clearance form x3 to [employer] [facility] Bur at [phone]. Form fax x3 and confirmed. Rn left vm for Margarita Grizzle to call back when receive.

## 2017-02-23 NOTE — Telephone Encounter (Signed)
Margarita Grizzle returned RN's call, she rec'd the fax.  FYI

## 2017-03-22 ENCOUNTER — Ambulatory Visit: Payer: BLUE CROSS/BLUE SHIELD | Attending: Specialist | Admitting: Physical Therapy

## 2017-03-22 ENCOUNTER — Other Ambulatory Visit: Payer: Self-pay

## 2017-03-22 ENCOUNTER — Encounter: Payer: Self-pay | Admitting: Physical Therapy

## 2017-03-22 DIAGNOSIS — G8929 Other chronic pain: Secondary | ICD-10-CM | POA: Diagnosis present

## 2017-03-22 DIAGNOSIS — M6281 Muscle weakness (generalized): Secondary | ICD-10-CM | POA: Insufficient documentation

## 2017-03-22 DIAGNOSIS — M25512 Pain in left shoulder: Secondary | ICD-10-CM | POA: Insufficient documentation

## 2017-03-22 DIAGNOSIS — M25612 Stiffness of left shoulder, not elsewhere classified: Secondary | ICD-10-CM | POA: Diagnosis present

## 2017-03-23 ENCOUNTER — Encounter: Payer: Self-pay | Admitting: Physical Therapy
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
## 2017-03-24 ENCOUNTER — Ambulatory Visit: Payer: BLUE CROSS/BLUE SHIELD

## 2017-03-24 DIAGNOSIS — M25612 Stiffness of left shoulder, not elsewhere classified: Secondary | ICD-10-CM

## 2017-03-24 DIAGNOSIS — M25512 Pain in left shoulder: Principal | ICD-10-CM

## 2017-03-24 DIAGNOSIS — G8929 Other chronic pain: Secondary | ICD-10-CM

## 2017-03-24 DIAGNOSIS — M6281 Muscle weakness (generalized): Secondary | ICD-10-CM

## 2017-03-24 NOTE — Therapy (Signed)
[place], [name], [address] Phone: [phone]   Fax:  [phone]  Physical Therapy Treatment  Patient Details  Name: Daniel Schneider MRN: [id number] Date of Birth: 06-10-1975 Referring Provider: Dr Sydnee Cabal    Encounter Date: 03/24/2017  PT End of Session - 03/24/17 1340    Visit Number  2    Number of Visits  16    Date for PT Re-Evaluation  05/17/17    Authorization Type  BCBS     PT Start Time  0137 late    PT Stop Time  0220    PT Time Calculation (min)  43 min    Activity Tolerance  Patient tolerated treatment well    Behavior During Therapy  [facility] for tasks assessed/performed       Past Medical History:  Diagnosis Date  . 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 PFO (patent foramen ovale)    a. 05/27/2015: s/p PFO closure w/ 58mm PFO occluder   . Sickle cell trait [facility])     Past Surgical History:  Procedure Laterality Date  . ASD AND VSD REPAIR  05/27/2015   Daniel Schneider 05/27/2015  . CARDIAC CATHETERIZATION N/A 05/27/2015   Procedure: ASD/VSD Closure;  Surgeon: Sherren Mocha, MD;  Location: [facility] CV LAB;  Service: Cardiovascular;  Laterality: N/A;  . TEE WITHOUT CARDIOVERSION N/A 04/21/2015   Procedure: TRANSESOPHAGEAL ECHOCARDIOGRAM (TEE);  Surgeon: Daniel Margarita, MD;  Location: [facility] ENDOSCOPY;  Service: Cardiovascular;  Laterality: N/A;    There were no vitals filed for this visit.  Subjective Assessment - 03/24/17 1338    Subjective  No changes,  Did the HEP..      Currently in Pain?  No/denies    Pain Score  3     Pain Location  Shoulder    Pain Orientation  Left    Pain Descriptors / Indicators  Throbbing    Pain Type  Acute pain;Chronic pain    Pain Onset  More than a month ago    Pain Frequency  Constant    Aggravating Factors   using arm    Pain Relieving Factors  rest    Multiple Pain Sites  No                       [facility] Adult PT Treatment/Exercise - 03/24/17 0001      Shoulder Exercises: Standing   Other Standing Exercises  pendulums  flex  /ext and side to side x 15 each, gripping x 10 reps       Modalities   Modalities  Cryotherapy      Cryotherapy   Number Minutes Cryotherapy  12 Minutes    Cryotherapy Location  Shoulder    Type of Cryotherapy  Ice pack      Manual Therapy   Manual Therapy  Joint mobilization;Soft tissue mobilization    Manual therapy comments  PROM into all planes per large RTC tear protocol     Joint Mobilization  Gr 2 inf glide and distraction    Soft tissue mobilization  to pectorals             PT Education - 03/24/17 1401    Education provided  Yes    Education Details  brief instruction on slow progression of  PT with his repair and patience . Scapula retraction to stretch pec minor and activate  posterior shoulder  4-5x/day  4-5 reps min     Person(s) Educated  Patient    Methods  Explanation;Demonstration;Tactile cues;Verbal cues    Comprehension  Returned demonstration;Verbalized understanding       PT Short Term Goals - 03/22/17 2140      PT SHORT TERM GOAL #1   Title  Patient will increase shoulder flexion by 30 degrees     Time  4    Period  Weeks    Status  New    Target Date  04/19/17      PT SHORT TERM GOAL #2   Title  Patient will increase his left shoulder ER by 25 degrees     Time  4    Period  Weeks    Status  New    Target Date  04/19/17      PT SHORT TERM GOAL #3   Title  Patient will increase gross left shoulder strength to 3+/5     Time  4    Period  Weeks    Status  New    Target Date  04/19/17      PT SHORT TERM GOAL #4   Title  Patients will independent with HEP     Time  4    Period  Weeks    Status  New    Target Date  04/19/17        PT Long Term Goals - 03/22/17 2143      PT LONG TERM GOAL #1   Title  Patient will reach behind his back to L3 without pain     Time  8     Period  Weeks    Status  New    Target Date  05/17/17      PT LONG TERM GOAL #2   Title  Patient will reach behind his head without pain in order to perfrom hygien tasks     Time  8    Target Date  04/19/17      PT LONG TERM GOAL #3   Title  Patient will reach overhead to a shelf with a 2lb weight without pain     Time  8    Period  Weeks    Status  New    Target Date  04/19/17            Plan - 03/24/17 1403    Clinical Impression Statement  Pain level low except with ROM . ROM within protocal limits   continue cautiously per protocol    PT Treatment/Interventions  ADLs/Self Care Home Management;Cryotherapy;Electrical Stimulation;Therapeutic activities;Therapeutic exercise;Neuromuscular re-education;Patient/family education;Manual techniques;Taping;Passive range of motion    PT Next Visit Plan  Continue into week 3 of protocol next week.     PT Home Exercise Plan  pendulum exer , scapula retraction    Consulted and Agree with Plan of Care  Patient       Patient will benefit from skilled therapeutic intervention in order to improve the following deficits and impairments:  Pain, Impaired UE functional use, Decreased activity tolerance, Decreased endurance, Decreased range of motion, Decreased strength  Visit Diagnosis: Chronic left shoulder pain  Stiffness of left shoulder, not elsewhere classified  Muscle weakness (generalized)     Problem List Patient Active Problem List   Diagnosis Date Noted  . Anxiety 08/03/2016  . Cerebrovascular accident (CVA) due to embolism of right middle cerebral artery ([place]) 05/30/2015  . Prediabetes 05/27/2015  . Sickle cell trait ([place]) 05/26/2015  . PFO (  patent foramen ovale)   . Acute CVA (cerebrovascular accident) ([place]) 04/20/2015  . Stroke ([place])   . HLD (hyperlipidemia)     Daniel Schneider  PT 03/24/2017, 2:08 PM  [facility] [address] [place], [name],  [address] Phone: [phone]   Fax:  [phone]  Name: Daniel Schneider MRN: [id number] Date of Birth: 12-Apr-1975

## 2017-03-30 ENCOUNTER — Encounter: Payer: Self-pay | Admitting: Physical Therapy

## 2017-03-30 ENCOUNTER — Ambulatory Visit: Payer: BLUE CROSS/BLUE SHIELD | Admitting: Physical Therapy

## 2017-03-30 DIAGNOSIS — G8929 Other chronic pain: Secondary | ICD-10-CM

## 2017-03-30 DIAGNOSIS — M6281 Muscle weakness (generalized): Secondary | ICD-10-CM

## 2017-03-30 DIAGNOSIS — M25512 Pain in left shoulder: Principal | ICD-10-CM

## 2017-03-30 DIAGNOSIS — M25612 Stiffness of left shoulder, not elsewhere classified: Secondary | ICD-10-CM

## 2017-03-30 NOTE — Therapy (Signed)
[place], [name], [address] Phone: [phone]   Fax:  [phone]  Physical Therapy Treatment  Patient Details  Name: Daniel Schneider MRN: [id number] Date of Birth: 12-22-1975 Referring Provider: Dr Sydnee Cabal    Encounter Date: 03/30/2017  PT End of Session - 03/30/17 1507    Visit Number  3    Number of Visits  16    Date for PT Re-Evaluation  05/17/17    PT Start Time  1856    PT Stop Time  1513    PT Time Calculation (min)  53 min    Activity Tolerance  Patient tolerated treatment well    Behavior During Therapy  [facility] for tasks assessed/performed       Past Medical History:  Diagnosis Date  . 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 PFO (patent foramen ovale)    a. 05/27/2015: s/p PFO closure w/ 76mm PFO occluder   . Sickle cell trait [facility])     Past Surgical History:  Procedure Laterality Date  . ASD AND VSD REPAIR  05/27/2015   [name] 05/27/2015  . CARDIAC CATHETERIZATION N/A 05/27/2015   Procedure: ASD/VSD Closure;  Surgeon: Sherren Mocha, MD;  Location: [place] [facility];  Service: Cardiovascular;  Laterality: N/A;  . TEE WITHOUT CARDIOVERSION N/A 04/21/2015   Procedure: TRANSESOPHAGEAL ECHOCARDIOGRAM (TEE);  Surgeon: Sueanne Margarita, MD;  Location: [facility] ENDOSCOPY;  Service: Cardiovascular;  Laterality: N/A;    There were no vitals filed for this visit.  Subjective Assessment - 03/30/17 1424    Subjective  Doing the exercises.  pain now intermittant.    Currently in Pain?  Yes    Pain Score  3     Pain Location  Shoulder    Pain Orientation  Left    Pain Descriptors / Indicators  Throbbing;Tightness    Pain Type  Acute pain;Chronic pain    Pain Frequency  Intermittent    Aggravating Factors   slipping onto shoulder on the bed    Pain Relieving Factors  rest    Effect of Pain on Daily Activities  Limited by protocol    Multiple  Pain Sites  -- has a bone bruise left ankle                      [facility] Adult PT Treatment/Exercise - 03/30/17 0001      Self-Care   Other Self-Care Comments   precautions reviewed,  shoulder anatomy,  time needed for tissue to heal.       Shoulder Exercises: Seated   Elevation  10 reps    Extension  10 reps    Retraction  10 reps    Other Seated Exercises  upper trap stretch,  levator stretch    Other Seated Exercises  circles      Shoulder Exercises: Standing   Other Standing Exercises  pendulums  flex  /ext and side to side x 1 minute each, gripping x 10 reps     Other Standing Exercises  Long rm rotation        Vasopneumatic   Number Minutes Vasopneumatic   15 minutes    Vasopnuematic Location   Shoulder    Vasopneumatic Pressure  Medium    Vasopneumatic Temperature   32      Manual Therapy   Manual Therapy  Joint mobilization    Manual therapy comments  PROM  into all planes per large RTC tear protocol  140 flexion at 30 ERto 30,    Abd to 70    Joint Mobilization  Gr 2 inf glide and distraction    Soft tissue mobilization  to neck,  teres             PT Education - 03/30/17 1507    Education provided  Yes    Education Details  Self care    Person(s) Educated  Patient    Methods  Explanation    Comprehension  Verbalized understanding       PT Short Term Goals - 03/22/17 2140      PT SHORT TERM GOAL #1   Title  Patient will increase shoulder flexion by 30 degrees     Time  4    Period  Weeks    Status  New    Target Date  04/19/17      PT SHORT TERM GOAL #2   Title  Patient will increase his left shoulder ER by 25 degrees     Time  4    Period  Weeks    Status  New    Target Date  04/19/17      PT SHORT TERM GOAL #3   Title  Patient will increase gross left shoulder strength to 3+/5     Time  4    Period  Weeks    Status  New    Target Date  04/19/17      PT SHORT TERM GOAL #4   Title  Patients will independent with HEP      Time  4    Period  Weeks    Status  New    Target Date  04/19/17        PT Long Term Goals - 03/22/17 2143      PT LONG TERM GOAL #1   Title  Patient will reach behind his back to L3 without pain     Time  8    Period  Weeks    Status  New    Target Date  05/17/17      PT LONG TERM GOAL #2   Title  Patient will reach behind his head without pain in order to perfrom hygien tasks     Time  8    Target Date  04/19/17      PT LONG TERM GOAL #3   Title  Patient will reach overhead to a shelf with a 2lb weight without pain     Time  8    Period  Weeks    Status  New    Target Date  04/19/17            Plan - 03/30/17 1507    Clinical Impression Statement  PROM flexion 140 .  All PRPM limits for large rotator cuff repair followed exactly.  No end feel with ROM.  Pain 2-3/10 prior to game ready.   Patient's precautions reviewed.  He is eager to get better.      PT Next Visit Plan  Continue into week 3 of protocol next week.     PT Home Exercise Plan  pendulum exer , scapula retraction    Consulted and Agree with Plan of Care  Patient       Patient will benefit from skilled therapeutic intervention in order to improve the following deficits and impairments:     Visit Diagnosis: Chronic left shoulder pain  Stiffness of left shoulder, not elsewhere classified  Muscle weakness (generalized)     Problem List Patient Active Problem List   Diagnosis Date Noted  . Anxiety 08/03/2016  . Cerebrovascular accident (CVA) due to embolism of right middle cerebral artery ([place]) 05/30/2015  . Prediabetes 05/27/2015  . Sickle cell trait ([place]) 05/26/2015  . PFO (patent foramen ovale)   . Acute CVA (cerebrovascular accident) ([place]) 04/20/2015  . Stroke ([place])   . HLD (hyperlipidemia)     HARRIS,KAREN  PTA 03/30/2017, 3:11 PM  [facility] [address] [place], [name], [address] Phone: [phone]   Fax:   [phone]  Name: Daniel Schneider MRN: [id number] Date of Birth: [birth year]

## 2017-03-31 ENCOUNTER — Ambulatory Visit: Payer: BLUE CROSS/BLUE SHIELD | Admitting: Physical Therapy

## 2017-03-31 ENCOUNTER — Telehealth: Payer: Self-pay | Admitting: Physical Therapy

## 2017-03-31 NOTE — Telephone Encounter (Signed)
Spoke to patient regarding missed visit. The patient did not know about the appointment and will be at his next appointment on 04/04/2017.

## 2017-04-04 ENCOUNTER — Ambulatory Visit: Payer: BLUE CROSS/BLUE SHIELD | Admitting: Physical Therapy

## 2017-04-04 DIAGNOSIS — G8929 Other chronic pain: Secondary | ICD-10-CM

## 2017-04-04 DIAGNOSIS — M25512 Pain in left shoulder: Principal | ICD-10-CM

## 2017-04-04 DIAGNOSIS — M25612 Stiffness of left shoulder, not elsewhere classified: Secondary | ICD-10-CM

## 2017-04-04 DIAGNOSIS — M6281 Muscle weakness (generalized): Secondary | ICD-10-CM

## 2017-04-04 NOTE — Therapy (Signed)
[place], [name], [address] Phone: [phone]   Fax:  [phone]  Physical Therapy Treatment  Patient Details  Name: Daniel Schneider MRN: [id number] Date of Birth: 18-Dec-1975 Referring Provider: Dr Sydnee Cabal    Encounter Date: 04/04/2017  PT End of Session - 04/04/17 0956    Visit Number  4    Number of Visits  16    Date for PT Re-Evaluation  05/17/17    Authorization Type  BCBS     PT Start Time  0848    PT Stop Time  0937    PT Time Calculation (min)  49 min    Activity Tolerance  Patient tolerated treatment well    Behavior During Therapy  [facility] for tasks assessed/performed       Past Medical History:  Diagnosis Date  . Cryptogenic stroke ([place]) 05/2015   [name] 05/27/2015; left hand weaker since (05/26/2015)  . PFO (patent foramen ovale)    a. 05/27/2015: s/p PFO closure w/ 78mm PFO occluder   . Sickle cell trait [facility])     Past Surgical History:  Procedure Laterality Date  . ASD AND VSD REPAIR  05/27/2015   [name] 05/27/2015  . CARDIAC CATHETERIZATION N/A 05/27/2015   Procedure: ASD/VSD Closure;  Surgeon: Sherren Mocha, MD;  Location: [place] [facility];  Service: Cardiovascular;  Laterality: N/A;  . TEE WITHOUT CARDIOVERSION N/A 04/21/2015   Procedure: TRANSESOPHAGEAL ECHOCARDIOGRAM (TEE);  Surgeon: Sueanne Margarita, MD;  Location: [facility] ENDOSCOPY;  Service: Cardiovascular;  Laterality: N/A;    There were no vitals filed for this visit.  Subjective Assessment - 04/04/17 0852    Subjective  Patient reports he has been sore because he has beenmoving boxes. patient stringly advised to not move any boxes.     Limitations  House hold activities;Lifting    Patient Stated Goals  Riding his bike     Pain Score  2     Pain Location  Shoulder    Pain Orientation  Left    Pain Descriptors / Indicators  Aching;Tightness    Pain Onset  More than a month ago    Pain Frequency  Intermittent     Aggravating Factors   rolling on shoulder; use of the shoulder     Pain Relieving Factors  rest     Effect of Pain on Daily Activities  limited protocol     Multiple Pain Sites  No         [facility] PT Assessment - 04/04/17 0001      PROM   Left Shoulder Flexion  144 Degrees    Left Shoulder Internal Rotation  80 Degrees    Left Shoulder External Rotation  45 Degrees                  [facility] Adult PT Treatment/Exercise - 04/04/17 0001      Shoulder Exercises: Seated   Retraction  10 reps    Other Seated Exercises  wrist flexion/ extension/ supination 2lb       Cryotherapy   Number Minutes Cryotherapy  10 Minutes    Cryotherapy Location  Shoulder    Type of Cryotherapy  Ice pack      Manual Therapy   Manual Therapy  Joint mobilization    Manual therapy comments  PROM into all planes per large RTC tear protocol  140 flexion at 30 ERto 30,    Abd to 70    Joint  Mobilization  Gr 2 inf glide and distraction    Soft tissue mobilization  to neck,  teres             PT Education - 04/04/17 0922    Education provided  Yes    Education Details  improtance of letting the shoulder heal     Person(s) Educated  Patient    Methods  Explanation;Tactile cues;Demonstration;Verbal cues    Comprehension  Verbalized understanding;Returned demonstration;Verbal cues required;Tactile cues required       PT Short Term Goals - 04/04/17 1040      PT SHORT TERM GOAL #1   Title  Patient will increase shoulder flexion by 30 degrees     Baseline  140     Time  4    Period  Weeks    Status  Achieved      PT SHORT TERM GOAL #2   Title  Patient will increase his left shoulder ER by 25 degrees     Time  4    Period  Weeks    Status  Achieved      PT SHORT TERM GOAL #3   Title  Patient will increase gross left shoulder strength to 3+/5     Baseline  not tested     Period  Weeks    Status  On-going      PT SHORT TERM GOAL #4   Title  Patients will independent with HEP      Baseline  working on what was given to him     Time  4    Period  Weeks    Status  On-going        PT Long Term Goals - 03/22/17 2143      PT LONG TERM GOAL #1   Title  Patient will reach behind his back to L3 without pain     Time  8    Period  Weeks    Status  New    Target Date  05/17/17      PT LONG TERM GOAL #2   Title  Patient will reach behind his head without pain in order to perfrom hygien tasks     Time  8    Target Date  04/19/17      PT LONG TERM GOAL #3   Title  Patient will reach overhead to a shelf with a 2lb weight without pain     Time  8    Period  Weeks    Status  New    Target Date  04/19/17            Plan - 04/04/17 1035    Clinical Impression Statement  Patient streongly advised not to use his shoulder. He has been using it to stabilizae boxes he was carrying with the other arm. He also showed therapy that he was limited in active abduction. He was told not to do theat under any circumstances, Therapy pent significant time educating the patient on healing. His passive rangehas inmproved. He has reached STG # 1     Clinical Presentation  Stable    Clinical Decision Making  Low    Rehab Potential  Excellent    PT Frequency  2x / week    PT Duration  8 weeks    PT Treatment/Interventions  ADLs/Self Care Home Management;Cryotherapy;Electrical Stimulation;Therapeutic activities;Therapeutic exercise;Neuromuscular re-education;Patient/family education;Manual techniques;Taping;Passive range of motion    PT Next Visit Plan  Continue into week 3 of protocol  next week.     PT Home Exercise Plan  pendulum exer , scapula retraction    Consulted and Agree with Plan of Care  Patient       Patient will benefit from skilled therapeutic intervention in order to improve the following deficits and impairments:  Pain, Impaired UE functional use, Decreased activity tolerance, Decreased endurance, Decreased range of motion, Decreased strength  Visit  Diagnosis: Chronic left shoulder pain  Stiffness of left shoulder, not elsewhere classified  Muscle weakness (generalized)     Problem List Patient Active Problem List   Diagnosis Date Noted  . Anxiety 08/03/2016  . Cerebrovascular accident (CVA) due to embolism of right middle cerebral artery ([place]) 05/30/2015  . Prediabetes 05/27/2015  . Sickle cell trait ([place]) 05/26/2015  . PFO (patent foramen ovale)   . Acute CVA (cerebrovascular accident) ([place]) 04/20/2015  . Stroke ([place])   . HLD (hyperlipidemia)     Carney Living PT DPT  04/04/2017, 11:59 AM  [facility] [address] [place], [name], [address] Phone: [phone]   Fax:  [phone]  Name: Daniel Schneider MRN: [id number] Date of Birth: October 27, 1975

## 2017-04-06 ENCOUNTER — Ambulatory Visit: Payer: BLUE CROSS/BLUE SHIELD | Admitting: Physical Therapy

## 2017-04-06 ENCOUNTER — Encounter: Payer: Self-pay | Admitting: Physical Therapy

## 2017-04-06 DIAGNOSIS — G8929 Other chronic pain: Secondary | ICD-10-CM

## 2017-04-06 DIAGNOSIS — M6281 Muscle weakness (generalized): Secondary | ICD-10-CM

## 2017-04-06 DIAGNOSIS — M25512 Pain in left shoulder: Principal | ICD-10-CM

## 2017-04-06 DIAGNOSIS — M25612 Stiffness of left shoulder, not elsewhere classified: Secondary | ICD-10-CM

## 2017-04-06 NOTE — Therapy (Signed)
[place], [name], [address] Phone: [phone]   Fax:  [phone]  Physical Therapy Treatment  Patient Details  Name: Daniel Schneider MRN: [id number] Date of Birth: 02/08/1976 Referring Provider: Dr Sydnee Cabal    Encounter Date: 04/06/2017  PT End of Session - 04/06/17 1152    Visit Number  5    Number of Visits  16    Date for PT Re-Evaluation  05/17/17    PT Start Time  0933    PT Stop Time  1033    PT Time Calculation (min)  60 min    Activity Tolerance  Patient tolerated treatment well    Behavior During Therapy  [facility] for tasks assessed/performed       Past Medical History:  Diagnosis Date  . Cryptogenic stroke ([place]) 05/2015   [name] 05/27/2015; left hand weaker since (05/26/2015)  . PFO (patent foramen ovale)    a. 05/27/2015: s/p PFO closure w/ 50mm PFO occluder   . Sickle cell trait [facility])     Past Surgical History:  Procedure Laterality Date  . ASD AND VSD REPAIR  05/27/2015   [name] 05/27/2015  . CARDIAC CATHETERIZATION N/A 05/27/2015   Procedure: ASD/VSD Closure;  Surgeon: Sherren Mocha, MD;  Location: [place] [facility];  Service: Cardiovascular;  Laterality: N/A;  . TEE WITHOUT CARDIOVERSION N/A 04/21/2015   Procedure: TRANSESOPHAGEAL ECHOCARDIOGRAM (TEE);  Surgeon: Sueanne Margarita, MD;  Location: [facility] ENDOSCOPY;  Service: Cardiovascular;  Laterality: N/A;    There were no vitals filed for this visit.  Subjective Assessment - 04/06/17 0937    Subjective  2/10 soreness,  wears sling.  Doing the exercises    Currently in Pain?  Yes    Pain Score  2     Pain Location  Shoulder    Pain Orientation  Left    Pain Type  Acute pain;Chronic pain    Pain Frequency  Intermittent    Multiple Pain Sites  -- had left shin calf pain yesterday sharp could not bear weight,  i almost went to the MD.  lasted 3 hours     grabbing.. bone pain.     has ankle pain  too has to walk gentle.             [facility] PT Assessment - 04/06/17 0001      PROM   Left Shoulder Flexion  153 Degrees                  [facility] Adult PT Treatment/Exercise - 04/06/17 0001      Shoulder Exercises: Seated   Retraction  10 reps    Other Seated Exercises  wrist 3 way 2, 3 LBS except extension 2 X 10 2 LBS      Shoulder Exercises: Standing   Other Standing Exercises  Long arm rotation10 X circles 5 x each direction.      Cryotherapy   Number Minutes Cryotherapy  10 Minutes    Cryotherapy Location  Shoulder    Type of Cryotherapy  -- cold pack      Manual Therapy   Manual Therapy  Joint mobilization    Manual therapy comments  PROM per protocol    Joint Mobilization  Gr 2 inf glide and distraction    Soft tissue mobilization  teres,  myofascial release anterior shoulder             PT Education - 04/06/17 1045  Education provided  No       PT Short Term Goals - 04/04/17 1040      PT SHORT TERM GOAL #1   Title  Patient will increase shoulder flexion by 30 degrees     Baseline  140     Time  4    Period  Weeks    Status  Achieved      PT SHORT TERM GOAL #2   Title  Patient will increase his left shoulder ER by 25 degrees     Time  4    Period  Weeks    Status  Achieved      PT SHORT TERM GOAL #3   Title  Patient will increase gross left shoulder strength to 3+/5     Baseline  not tested     Period  Weeks    Status  On-going      PT SHORT TERM GOAL #4   Title  Patients will independent with HEP     Baseline  working on what was given to him     Time  4    Period  Weeks    Status  On-going        PT Long Term Goals - 03/22/17 2143      PT LONG TERM GOAL #1   Title  Patient will reach behind his back to L3 without pain     Time  8    Period  Weeks    Status  New    Target Date  05/17/17      PT LONG TERM GOAL #2   Title  Patient will reach behind his head without pain in order to perfrom hygien tasks     Time  8    Target Date  04/19/17       PT LONG TERM GOAL #3   Title  Patient will reach overhead to a shelf with a 2lb weight without pain     Time  8    Period  Weeks    Status  New    Target Date  04/19/17            Plan - 04/06/17 1153    Clinical Impression Statement  Patient has been following the precautions per his report. PROM flexion continues to improve, see flow sheet.  He still has stiches on portals and does not have any MD appointment set yet with MD.      PT Next Visit Plan  Continue into week 3 of protocol next week.     PT Home Exercise Plan  pendulum exer , scapula retraction    Recommended Other Services  Patient to call MD office and ask when Follow up appointment is to remove stiches    Consulted and Agree with Plan of Care  Patient       Patient will benefit from skilled therapeutic intervention in order to improve the following deficits and impairments:     Visit Diagnosis: Chronic left shoulder pain  Stiffness of left shoulder, not elsewhere classified  Muscle weakness (generalized)     Problem List Patient Active Problem List   Diagnosis Date Noted  . Anxiety 08/03/2016  . Cerebrovascular accident (CVA) due to embolism of right middle cerebral artery ([place]) 05/30/2015  . Prediabetes 05/27/2015  . Sickle cell trait ([place]) 05/26/2015  . PFO (patent foramen ovale)   . Acute CVA (cerebrovascular accident) ([place]) 04/20/2015  . Stroke ([place])   . HLD (hyperlipidemia)  [name] PTA 04/06/2017, 11:58 AM  [facility] [address] [place], [name], [address] Phone: [phone]   Fax:  [phone]  Name: Daniel Schneider MRN: [id number] Date of Birth: 1975/04/12

## 2017-04-11 ENCOUNTER — Ambulatory Visit: Payer: BLUE CROSS/BLUE SHIELD | Attending: Specialist | Admitting: Physical Therapy

## 2017-04-11 ENCOUNTER — Encounter: Payer: Self-pay | Admitting: Physical Therapy

## 2017-04-11 DIAGNOSIS — M6281 Muscle weakness (generalized): Secondary | ICD-10-CM | POA: Diagnosis present

## 2017-04-11 DIAGNOSIS — M25612 Stiffness of left shoulder, not elsewhere classified: Secondary | ICD-10-CM | POA: Diagnosis present

## 2017-04-11 DIAGNOSIS — M25512 Pain in left shoulder: Secondary | ICD-10-CM | POA: Insufficient documentation

## 2017-04-11 DIAGNOSIS — G8929 Other chronic pain: Secondary | ICD-10-CM | POA: Diagnosis present

## 2017-04-11 NOTE — Therapy (Signed)
[place], [name], [address] Phone: [phone]   Fax:  [phone]  Physical Therapy Treatment  Patient Details  Name: SION REINDERS MRN: [id number] Date of Birth: 05/31/1975 Referring Provider: Dr Sydnee Cabal    Encounter Date: 04/11/2017  PT End of Session - 04/11/17 1034    Visit Number  6    Number of Visits  16    Date for PT Re-Evaluation  05/17/17    Authorization Type  BCBS     PT Start Time  1003    PT Stop Time  1042    PT Time Calculation (min)  39 min       Past Medical History:  Diagnosis Date  . Cryptogenic stroke ([place]) 05/2015   [name] 05/27/2015; left hand weaker since (05/26/2015)  . PFO (patent foramen ovale)    a. 05/27/2015: s/p PFO closure w/ 63mm PFO occluder   . Sickle cell trait [facility])     Past Surgical History:  Procedure Laterality Date  . ASD AND VSD REPAIR  05/27/2015   [name] 05/27/2015  . CARDIAC CATHETERIZATION N/A 05/27/2015   Procedure: ASD/VSD Closure;  Surgeon: Sherren Mocha, MD;  Location: [place] CV LAB;  Service: Cardiovascular;  Laterality: N/A;  . TEE WITHOUT CARDIOVERSION N/A 04/21/2015   Procedure: TRANSESOPHAGEAL ECHOCARDIOGRAM (TEE);  Surgeon: Sueanne Margarita, MD;  Location: [facility] ENDOSCOPY;  Service: Cardiovascular;  Laterality: N/A;    There were no vitals filed for this visit.  Subjective Assessment - 04/11/17 1034    Subjective  2/10 most of the time.     Currently in Pain?  Yes    Pain Score  2     Pain Location  Shoulder    Pain Orientation  Left    Pain Descriptors / Indicators  Aching;Tightness;Sore    Aggravating Factors   end range stretch, rolling on shoulder    Pain Relieving Factors  rest, sling                       [facility] Adult PT Treatment/Exercise - 04/11/17 0001      Shoulder Exercises: Seated   Retraction  10 reps      Shoulder Exercises: Standing   Other Standing Exercises  pendulums  flex  /ext and side  to side x 1 minute each, gripping x 10 reps     Other Standing Exercises  Long arm rotation10 X circles 5 x each direction.      Cryotherapy   Number Minutes Cryotherapy  --      Vasopneumatic   Number Minutes Vasopneumatic   15 minutes    Vasopnuematic Location   Shoulder    Vasopneumatic Pressure  Low    Vasopneumatic Temperature   34      Manual Therapy   Manual therapy comments  PROM per protocol    Joint Mobilization  Gr 2 inf glide and distraction               PT Short Term Goals - 04/04/17 1040      PT SHORT TERM GOAL #1   Title  Patient will increase shoulder flexion by 30 degrees     Baseline  140     Time  4    Period  Weeks    Status  Achieved      PT SHORT TERM GOAL #2   Title  Patient will increase his left shoulder ER by 25 degrees  Time  4    Period  Weeks    Status  Achieved      PT SHORT TERM GOAL #3   Title  Patient will increase gross left shoulder strength to 3+/5     Baseline  not tested     Period  Weeks    Status  On-going      PT SHORT TERM GOAL #4   Title  Patients will independent with HEP     Baseline  working on what was given to him     Time  4    Period  Weeks    Status  On-going        PT Long Term Goals - 03/22/17 2143      PT LONG TERM GOAL #1   Title  Patient will reach behind his back to L3 without pain     Time  8    Period  Weeks    Status  New    Target Date  05/17/17      PT LONG TERM GOAL #2   Title  Patient will reach behind his head without pain in order to perfrom hygien tasks     Time  8    Target Date  04/19/17      PT LONG TERM GOAL #3   Title  Patient will reach overhead to a shelf with a 2lb weight without pain     Time  8    Period  Weeks    Status  New    Target Date  04/19/17            Plan - 04/11/17 1037    Clinical Impression Statement  Pt reports stitches were removed last week. He has a note from MD but he forgot to bring it today. Continued PROM monitoring for pain  throughout.     PT Next Visit Plan  Continue into week 4 of protocol (surgery Feb 5th)     PT Home Exercise Plan  pendulum exer , scapula retraction    Consulted and Agree with Plan of Care  Patient       Patient will benefit from skilled therapeutic intervention in order to improve the following deficits and impairments:  Pain, Impaired UE functional use, Decreased activity tolerance, Decreased endurance, Decreased range of motion, Decreased strength  Visit Diagnosis: Chronic left shoulder pain  Stiffness of left shoulder, not elsewhere classified  Muscle weakness (generalized)     Problem List Patient Active Problem List   Diagnosis Date Noted  . Anxiety 08/03/2016  . Cerebrovascular accident (CVA) due to embolism of right middle cerebral artery ([place]) 05/30/2015  . Prediabetes 05/27/2015  . Sickle cell trait ([place]) 05/26/2015  . PFO (patent foramen ovale)   . Acute CVA (cerebrovascular accident) ([place]) 04/20/2015  . Stroke ([place])   . HLD (hyperlipidemia)     Dorene Ar, [place] 04/11/2017, 10:41 AM  [facility] [address] [place], [name], [address] Phone: [phone]   Fax:  [phone]  Name: DAMASCUS FELDPAUSCH MRN: [id number] Date of Birth: 1975-11-30

## 2017-04-13 ENCOUNTER — Ambulatory Visit: Payer: BLUE CROSS/BLUE SHIELD | Admitting: Physical Therapy

## 2017-04-13 ENCOUNTER — Encounter: Payer: Self-pay | Admitting: Physical Therapy

## 2017-04-13 DIAGNOSIS — G8929 Other chronic pain: Secondary | ICD-10-CM

## 2017-04-13 DIAGNOSIS — M25612 Stiffness of left shoulder, not elsewhere classified: Secondary | ICD-10-CM

## 2017-04-13 DIAGNOSIS — M6281 Muscle weakness (generalized): Secondary | ICD-10-CM

## 2017-04-13 DIAGNOSIS — M25512 Pain in left shoulder: Secondary | ICD-10-CM | POA: Diagnosis not present

## 2017-04-13 NOTE — Therapy (Signed)
[place], [name], [address] Phone: [phone]   Fax:  [phone]  Physical Therapy Treatment  Patient Details  Name: Daniel Schneider MRN: [id number] Date of Birth: [birth year] Referring Provider: Dr Clearance Coots    Encounter Date: 04/13/2017  PT End of Session - 04/13/17 1037    Visit Number  7    Number of Visits  16    Date for PT Re-Evaluation  05/17/17    Authorization Type  BCBS     PT Start Time  1015    PT Stop Time  1110    PT Time Calculation (min)  55 min    Activity Tolerance  Patient tolerated treatment well    Behavior During Therapy  [facility] for tasks assessed/performed       Past Medical History:  Diagnosis Date  . Cryptogenic stroke ([place]) 05/2015   [name] 05/27/2015; left hand weaker since (05/26/2015)  . PFO (patent foramen ovale)    a. 05/27/2015: s/p PFO closure w/ 19mm PFO occluder   . Sickle cell trait [facility])     Past Surgical History:  Procedure Laterality Date  . ASD AND VSD REPAIR  05/27/2015   [name] 05/27/2015  . CARDIAC CATHETERIZATION N/A 05/27/2015   Procedure: ASD/VSD Closure;  Surgeon: Sherren Mocha, MD;  Location: [place] CV LAB;  Service: Cardiovascular;  Laterality: N/A;  . TEE WITHOUT CARDIOVERSION N/A 04/21/2015   Procedure: TRANSESOPHAGEAL ECHOCARDIOGRAM (TEE);  Surgeon: Sueanne Margarita, MD;  Location: [facility] ENDOSCOPY;  Service: Cardiovascular;  Laterality: N/A;    There were no vitals filed for this visit.  Subjective Assessment - 04/13/17 1036    Subjective  Patient reports it hurts when his i using it. Therapy again advised him he should be using it. He should keep it in a sling and let it heal.     Limitations  House hold activities;Lifting    Patient Stated Goals  Riding his bike     Currently in Pain?  Yes    Pain Score  2          [facility] PT Assessment - 04/13/17 0001      Assessment   Medical Diagnosis  Left sided low back pain with Left Sciatica      Referring Provider  Dr Clearance Coots     Onset Date/Surgical Date  -- 6 months prior     Hand Dominance  Right    Next MD Visit  None Scheduled                   [facility] Adult PT Treatment/Exercise - 04/13/17 0001      Shoulder Exercises: Seated   Retraction  20 reps    Other Seated Exercises  wrist 3 way 2, 3 LBS       Shoulder Exercises: Standing   Other Standing Exercises  pendulums  flex  /ext and side to side x 1 minute each, gripping x 10 reps       Manual Therapy   Manual Therapy  Joint mobilization    Manual therapy comments  PROM per protocol    Joint Mobilization  Gr 2 inf glide and distraction    Soft tissue mobilization  teres,  myofascial release anterior shoulder             PT Education - 04/13/17 1037    Education provided  Yes    Education Details  improtance of letting the shoulder heal. No carryover  at all.    Person(s) Educated  Patient    Methods  Explanation;Demonstration;Tactile cues;Verbal cues    Comprehension  Need further instruction       PT Short Term Goals - 04/04/17 1040      PT SHORT TERM GOAL #1   Title  Patient will increase shoulder flexion by 30 degrees     Baseline  140     Time  4    Period  Weeks    Status  Achieved      PT SHORT TERM GOAL #2   Title  Patient will increase his left shoulder ER by 25 degrees     Time  4    Period  Weeks    Status  Achieved      PT SHORT TERM GOAL #3   Title  Patient will increase gross left shoulder strength to 3+/5     Baseline  not tested     Period  Weeks    Status  On-going      PT SHORT TERM GOAL #4   Title  Patients will independent with HEP     Baseline  working on what was given to him     Time  4    Period  Weeks    Status  On-going        PT Long Term Goals - 03/22/17 2143      PT LONG TERM GOAL #1   Title  Patient will reach behind his back to L3 without pain     Time  8    Period  Weeks    Status  New    Target Date  05/17/17      PT LONG  TERM GOAL #2   Title  Patient will reach behind his head without pain in order to perfrom hygien tasks     Time  8    Target Date  04/19/17      PT LONG TERM GOAL #3   Title  Patient will reach overhead to a shelf with a 2lb weight without pain     Time  8    Period  Weeks    Status  New    Target Date  04/19/17            Plan - 04/13/17 1107    Clinical Impression Statement  Patient continues to be non-compliant with activity restrictions. His note from the MD states that he only had an SAD. If that is true therapy will increase activity next visit. He will be 5 weeks next visit anyway so begin pulleys; light scpaular strengthening and AAAROM activity as tolrated.     Clinical Presentation  Stable    Clinical Decision Making  Low    Rehab Potential  Excellent    PT Frequency  2x / week    PT Duration  8 weeks    PT Treatment/Interventions  ADLs/Self Care Home Management;Cryotherapy;Electrical Stimulation;Therapeutic activities;Therapeutic exercise;Neuromuscular re-education;Patient/family education;Manual techniques;Taping;Passive range of motion    PT Next Visit Plan  Continue into week 4 of protocol (surgery Feb 5th)     PT Home Exercise Plan  pendulum exer , scapula retraction    Consulted and Agree with Plan of Care  Patient       Patient will benefit from skilled therapeutic intervention in order to improve the following deficits and impairments:  Pain, Impaired UE functional use, Decreased activity tolerance, Decreased endurance, Decreased range of motion, Decreased strength  Visit Diagnosis: Chronic  left shoulder pain  Stiffness of left shoulder, not elsewhere classified  Muscle weakness (generalized)     Problem List Patient Active Problem List   Diagnosis Date Noted  . Anxiety 08/03/2016  . 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 Prediabetes 05/27/2015  . Sickle cell trait ([place]) 05/26/2015  . PFO  (patent foramen ovale)   . Acute CVA (cerebrovascular accident) ([place]) 04/20/2015  . Stroke ([place])   . HLD (hyperlipidemia)     Carney Living PT DPT  04/13/2017, 4:19 PM  [facility] [address] [place], [name], [address] Phone: [phone]   Fax:  [phone]  Name: Daniel Schneider MRN: [id number] Date of Birth: April 27, 1975

## 2017-04-18 ENCOUNTER — Ambulatory Visit: Payer: BLUE CROSS/BLUE SHIELD | Admitting: Physical Therapy

## 2017-04-18 ENCOUNTER — Encounter: Payer: Self-pay | Admitting: Physical Therapy

## 2017-04-18 DIAGNOSIS — M25612 Stiffness of left shoulder, not elsewhere classified: Secondary | ICD-10-CM

## 2017-04-18 DIAGNOSIS — M25512 Pain in left shoulder: Principal | ICD-10-CM

## 2017-04-18 DIAGNOSIS — M6281 Muscle weakness (generalized): Secondary | ICD-10-CM

## 2017-04-18 DIAGNOSIS — G8929 Other chronic pain: Secondary | ICD-10-CM

## 2017-04-18 NOTE — Therapy (Signed)
[place], [name], [address] Phone: [phone]   Fax:  [phone]  Physical Therapy Treatment  Patient Details  Name: Daniel Schneider MRN: [id number] Date of Birth: 06/20/1975 Referring Provider: Dr Clearance Coots    Encounter Date: 04/18/2017  PT End of Session - 04/18/17 1301    Visit Number  8    Number of Visits  16    Date for PT Re-Evaluation  05/17/17    PT Start Time  0937    PT Stop Time  1030    PT Time Calculation (min)  53 min    Activity Tolerance  Patient tolerated treatment well    Behavior During Therapy  [facility] for tasks assessed/performed       Past Medical History:  Diagnosis Date  . Cryptogenic stroke ([place]) 05/2015   [name] 05/27/2015; left hand weaker since (05/26/2015)  . PFO (patent foramen ovale)    a. 05/27/2015: s/p PFO closure w/ 59mm PFO occluder   . Sickle cell trait [facility])     Past Surgical History:  Procedure Laterality Date  . ASD AND VSD REPAIR  05/27/2015   [name] 05/27/2015  . CARDIAC CATHETERIZATION N/A 05/27/2015   Procedure: ASD/VSD Closure;  Surgeon: Sherren Mocha, MD;  Location: [place] CV LAB;  Service: Cardiovascular;  Laterality: N/A;  . TEE WITHOUT CARDIOVERSION N/A 04/21/2015   Procedure: TRANSESOPHAGEAL ECHOCARDIOGRAM (TEE);  Surgeon: Sueanne Margarita, MD;  Location: [facility] ENDOSCOPY;  Service: Cardiovascular;  Laterality: N/A;    There were no vitals filed for this visit.  Subjective Assessment - 04/18/17 0942    Subjective  No pain at rest.  I want to know when to get rid of the sling.    Currently in Pain?  No/denies    Pain Score  0-No pain    Pain Location  Shoulder    Pain Orientation  Left    Pain Type  Acute pain;Chronic pain    Pain Frequency  Intermittent    Aggravating Factors   end range stretching,  rolling onto shoulder at night,  sling    Pain Relieving Factors  stretching,  rest sling     Effect of Pain on Daily Activities  limited  by protocol                      [facility] Adult PT Treatment/Exercise - 04/18/17 0001      Shoulder Exercises: Supine   External Rotation  10 reps;AAROM cane.  ROM improving    Flexion  AAROM;5 reps cane Min assist to return from end range      Shoulder Exercises: Isometric Strengthening   Flexion Limitations  10 x 5 seconds,  cued for pain free    Extension  -- 10 x 5 seconds,  cued painfree    Extension Limitations  -- 10 x 5 seconds,  cued pain free    External Rotation Limitations  10 x 5 sceonds, cued light pressure,  pain free HEP    Internal Rotation Limitations  10 x 5 seconds,  cued for painfree    ABduction Limitations  10 x 5 seconds,  cued pain free      Vasopneumatic   Number Minutes Vasopneumatic   15 minutes    Vasopnuematic Location   Shoulder    Vasopneumatic Pressure  Medium    Vasopneumatic Temperature   34      Manual Therapy   Manual Therapy  Joint mobilization;Soft  tissue mobilization    Manual therapy comments  PROM per protocol    Joint Mobilization  Gr 2 inf glide and distraction    Soft tissue mobilization  teres,  myofascial release anterior shoulder             PT Education - 04/18/17 1255    Education provided  Yes    Education Details  HEP    Person(s) Educated  Patient    Methods  Explanation;Demonstration;Tactile cues;Verbal cues    Comprehension  Verbalized understanding       PT Short Term Goals - 04/04/17 1040      PT SHORT TERM GOAL #1   Title  Patient will increase shoulder flexion by 30 degrees     Baseline  140     Time  4    Period  Weeks    Status  Achieved      PT SHORT TERM GOAL #2   Title  Patient will increase his left shoulder ER by 25 degrees     Time  4    Period  Weeks    Status  Achieved      PT SHORT TERM GOAL #3   Title  Patient will increase gross left shoulder strength to 3+/5     Baseline  not tested     Period  Weeks    Status  On-going      PT SHORT TERM GOAL #4   Title  Patients  will independent with HEP     Baseline  working on what was given to him     Time  4    Period  Weeks    Status  On-going        PT Long Term Goals - 03/22/17 2143      PT LONG TERM GOAL #1   Title  Patient will reach behind his back to L3 without pain     Time  8    Period  Weeks    Status  New    Target Date  05/17/17      PT LONG TERM GOAL #2   Title  Patient will reach behind his head without pain in order to perfrom hygien tasks     Time  8    Target Date  04/19/17      PT LONG TERM GOAL #3   Title  Patient will reach overhead to a shelf with a 2lb weight without pain     Time  8    Period  Weeks    Status  New    Target Date  04/19/17            Plan - 04/18/17 1302    Clinical Impression Statement  Pain after exercise 3/10 prior to game ready.  Able to progress HEP for light strengthening.  He was too aggressive with cane exercise he needed assist to return from flexion so did not add theses to HEP.  AAROM WNL in supine with Cane for flexion.     PT Next Visit Plan  Continue into week 4 of protocol (surgery Feb 5th) review isometrics.  progress AA ROM,  scapular strengthening  gentle    PT Home Exercise Plan  pendulum exer , scapula retraction,  isometrics sub max       Patient will benefit from skilled therapeutic intervention in order to improve the following deficits and impairments:     Visit Diagnosis: Chronic left shoulder pain  Stiffness of left  shoulder, not elsewhere classified  Muscle weakness (generalized)     Problem List Patient Active Problem List   Diagnosis Date Noted  . Anxiety 08/03/2016  . Cerebrovascular accident (CVA) due to embolism of right middle cerebral artery ([place]) 05/30/2015  . Prediabetes 05/27/2015  . Sickle cell trait ([place]) 05/26/2015  . PFO (patent foramen ovale)   . Acute CVA (cerebrovascular accident) ([place]) 04/20/2015  . Stroke ([place])   . HLD (hyperlipidemia)     Bristyl Mclees PTA 04/18/2017, 1:07 PM  [facility] [address] [place], [name], [address] Phone: [phone]   Fax:  [phone]  Name: Daniel Schneider MRN: [id number] Date of Birth: 10/06/1975

## 2017-04-18 NOTE — Patient Instructions (Addendum)
Strengthening: Isometric Flexion  Using wall for resistance, press right fist into ball using light pressure. Hold __5__ seconds. Repeat __10_ times per set. Do __1-2__ sets per session. Do ___1-2_ sessions per day.  SHOULDER: Abduction (Isometric)  Use wall as resistance. Press arm against pillow. Keep elbow straight. Hold 5___ seconds. _10__ reps per set, _1-2__ sets per day, _7__ days per week  Extension (Isometric)  Place left bent elbow and back of arm against wall. Press elbow against wall. Hold __5__ seconds. Repeat ___5-10_ times. Do __1-2__ sessions per day. Internal Rotation (Isometric)  .  External Rotation (Isometric)  Place back of left fist against door frame, with elbow bent. Press fist against door frame. Hold __5__ seconds. Repeat __10__ times. Do _1-2Abduction (Isometric)    Resist upward motion to the side with other hand on upper arm. Hold __5__ seconds. Relax. Repeat _5-10___ times. Do __1-2__ sessions per day. Activity: Push arm out to side against padded furniture.* May press hand into door frame.Extension (Isometric)    Press elbow into the padded back of seat. Hold _5___ seconds. Relax. Repeat _5-10___ times. Do __1-2__ sessions per day. 1-2Copyright  VHI. All rights reserved.   Can stand back to door frame and press.Internal Rotation (Isometric)    Place palm of right fist against door frame, with elbow bent. Press fist against door frame. Hold _5___ seconds. Repeat _5-10___ times. Do 1-2____ sessions per day.  http://gt2.exer.us/107   Copyright  VHI. All rights reserved.     Copyright  VHI. All rights reserved.   Copyright  VHI. All rights reserved.

## 2017-04-20 ENCOUNTER — Ambulatory Visit: Payer: BLUE CROSS/BLUE SHIELD | Admitting: Physical Therapy

## 2017-04-20 ENCOUNTER — Encounter: Payer: Self-pay | Admitting: Physical Therapy

## 2017-04-20 DIAGNOSIS — G8929 Other chronic pain: Secondary | ICD-10-CM

## 2017-04-20 DIAGNOSIS — M6281 Muscle weakness (generalized): Secondary | ICD-10-CM

## 2017-04-20 DIAGNOSIS — M25512 Pain in left shoulder: Principal | ICD-10-CM

## 2017-04-20 DIAGNOSIS — M25612 Stiffness of left shoulder, not elsewhere classified: Secondary | ICD-10-CM

## 2017-04-20 NOTE — Therapy (Signed)
[place], [name], [address] Phone: [phone]   Fax:  [phone]  Physical Therapy Treatment  Patient Details  Name: Daniel Schneider MRN: [id number] Date of Birth: 02-11-1975 Referring Provider: Dr Clearance Coots    Encounter Date: 04/20/2017  PT End of Session - 04/20/17 0959    Visit Number  9    Number of Visits  16    Date for PT Re-Evaluation  05/17/17    Authorization Type  BCBS     PT Start Time  0930    PT Stop Time  1013    PT Time Calculation (min)  43 min    Activity Tolerance  Patient tolerated treatment well    Behavior During Therapy  [facility] for tasks assessed/performed       Past Medical History:  Diagnosis Date  . Cryptogenic stroke ([place]) 05/2015   [name] 05/27/2015; left hand weaker since (05/26/2015)  . PFO (patent foramen ovale)    a. 05/27/2015: s/p PFO closure w/ 48mm PFO occluder   . Sickle cell trait [facility])     Past Surgical History:  Procedure Laterality Date  . ASD AND VSD REPAIR  05/27/2015   [name] 05/27/2015  . CARDIAC CATHETERIZATION N/A 05/27/2015   Procedure: ASD/VSD Closure;  Surgeon: Sherren Mocha, MD;  Location: [place] CV LAB;  Service: Cardiovascular;  Laterality: N/A;  . TEE WITHOUT CARDIOVERSION N/A 04/21/2015   Procedure: TRANSESOPHAGEAL ECHOCARDIOGRAM (TEE);  Surgeon: Sueanne Margarita, MD;  Location: [facility] ENDOSCOPY;  Service: Cardiovascular;  Laterality: N/A;    There were no vitals filed for this visit.  Subjective Assessment - 04/20/17 0934    Subjective  Patient continues to hav no pain at rest. He had no significant increase in pain after the last treatment.     Limitations  House hold activities;Lifting    Patient Stated Goals  Riding his bike     Currently in Pain?  No/denies                      [facility] Adult PT Treatment/Exercise - 04/20/17 0001      Shoulder Exercises: Supine   External Rotation  10 reps;AAROM cane.  ROM  improving    Flexion  AAROM;5 reps cane Min assist to return from end range      Shoulder Exercises: Standing   Extension  20 reps;Theraband    Theraband Level (Shoulder Extension)  Level 1 (Yellow)    Row  20 reps    Retraction  Theraband    Theraband Level (Shoulder Retraction)  Level 1 (Yellow)      Shoulder Exercises: Isometric Strengthening   Flexion Limitations  10 x 5 seconds,  cued for pain free    External Rotation Limitations  10 x 5 sceonds, cued light pressure,  pain free HEP    Internal Rotation Limitations  10 x 5 seconds,  cued for painfree    ABduction Limitations  10 x 5 seconds,  cued pain free      Vasopneumatic   Number Minutes Vasopneumatic   15 minutes    Vasopnuematic Location   Shoulder    Vasopneumatic Pressure  Medium    Vasopneumatic Temperature   34      Manual Therapy   Manual Therapy  Joint mobilization;Soft tissue mobilization    Manual therapy comments  PROM per protocol    Joint Mobilization  Gr 2 inf glide and distraction    Soft tissue  mobilization  teres,  myofascial release anterior shoulder             PT Education - 04/20/17 0959    Education provided  Yes    Education Details  Reviewed [place] technique     Person(s) Educated  Patient    Methods  Explanation;Demonstration;Tactile cues;Verbal cues    Comprehension  Verbalized understanding;Returned demonstration;Verbal cues required;Tactile cues required       PT Short Term Goals - 04/04/17 1040      PT SHORT TERM GOAL #1   Title  Patient will increase shoulder flexion by 30 degrees     Baseline  140     Time  4    Period  Weeks    Status  Achieved      PT SHORT TERM GOAL #2   Title  Patient will increase his left shoulder ER by 25 degrees     Time  4    Period  Weeks    Status  Achieved      PT SHORT TERM GOAL #3   Title  Patient will increase gross left shoulder strength to 3+/5     Baseline  not tested     Period  Weeks    Status  On-going      PT SHORT TERM  GOAL #4   Title  Patients will independent with HEP     Baseline  working on what was given to him     Time  4    Period  Weeks    Status  On-going        PT Long Term Goals - 03/22/17 2143      PT LONG TERM GOAL #1   Title  Patient will reach behind his back to L3 without pain     Time  8    Period  Weeks    Status  New    Target Date  05/17/17      PT LONG TERM GOAL #2   Title  Patient will reach behind his head without pain in order to perfrom hygien tasks     Time  8    Target Date  04/19/17      PT LONG TERM GOAL #3   Title  Patient will reach overhead to a shelf with a 2lb weight without pain     Time  8    Period  Weeks    Status  New    Target Date  04/19/17            Plan - 04/20/17 1001    Clinical Impression Statement  Therapy added wand flexion AAROM. Hereported pain coming down from flexion. therapy also added light resitance to retractions and extensions. he had no increase in pain. The patient was encouraged again not to put himself in pain when he is doing his stretching and exercises.     Clinical Presentation  Stable    Clinical Decision Making  Low    Rehab Potential  Excellent    PT Frequency  2x / week    PT Duration  8 weeks    PT Treatment/Interventions  ADLs/Self Care Home Management;Cryotherapy;Electrical Stimulation;Therapeutic activities;Therapeutic exercise;Neuromuscular re-education;Patient/family education;Manual techniques;Taping;Passive range of motion    PT Next Visit Plan  Continue into week 4 of protocol (surgery Feb 5th) review isometrics.  progress AA ROM,  scapular strengthening  gentle    PT Home Exercise Plan  pendulum exer , scapula retraction,  isometrics sub max  Consulted and Agree with Plan of Care  Patient       Patient will benefit from skilled therapeutic intervention in order to improve the following deficits and impairments:  Pain, Impaired UE functional use, Decreased activity tolerance, Decreased endurance,  Decreased range of motion, Decreased strength  Visit Diagnosis: Chronic left shoulder pain  Stiffness of left shoulder, not elsewhere classified  Muscle weakness (generalized)     Problem List Patient Active Problem List   Diagnosis Date Noted  . Anxiety 08/03/2016  . Cerebrovascular accident (CVA) due to embolism of right middle cerebral artery ([place]) 05/30/2015  . Prediabetes 05/27/2015  . Sickle cell trait ([place]) 05/26/2015  . PFO (patent foramen ovale)   . Acute CVA (cerebrovascular accident) ([place]) 04/20/2015  . Stroke ([place])   . HLD (hyperlipidemia)     Carney Living PT DPT  04/20/2017, 10:16 PM  [facility] [address] [place], [name], [address] Phone: [phone]   Fax:  [phone]  Name: Daniel Schneider MRN: [id number] Date of Birth: 10-12-1975

## 2017-04-25 ENCOUNTER — Ambulatory Visit: Payer: BLUE CROSS/BLUE SHIELD | Admitting: Physical Therapy

## 2017-04-25 ENCOUNTER — Encounter: Payer: Self-pay | Admitting: Physical Therapy

## 2017-04-25 ENCOUNTER — Ambulatory Visit (HOSPITAL_COMMUNITY)
Admission: EM | Admit: 2017-04-25 | Discharge: 2017-04-25 | Disposition: A | Discharge status: Home / Self Care | Payer: BLUE CROSS/BLUE SHIELD | Attending: Internal Medicine | Admitting: Internal Medicine

## 2017-04-25 ENCOUNTER — Encounter (HOSPITAL_COMMUNITY): Payer: Self-pay | Admitting: Emergency Medicine

## 2017-04-25 DIAGNOSIS — Z8673 Personal history of transient ischemic attack (TIA), and cerebral infarction without residual deficits: Secondary | ICD-10-CM | POA: Insufficient documentation

## 2017-04-25 DIAGNOSIS — Q211 Atrial septal defect: Secondary | ICD-10-CM | POA: Insufficient documentation

## 2017-04-25 DIAGNOSIS — R110 Nausea: Secondary | ICD-10-CM | POA: Insufficient documentation

## 2017-04-25 DIAGNOSIS — Z9889 Other specified postprocedural states: Secondary | ICD-10-CM | POA: Insufficient documentation

## 2017-04-25 DIAGNOSIS — K5900 Constipation, unspecified: Secondary | ICD-10-CM | POA: Diagnosis not present

## 2017-04-25 DIAGNOSIS — M25512 Pain in left shoulder: Principal | ICD-10-CM

## 2017-04-25 DIAGNOSIS — R369 Urethral discharge, unspecified: Secondary | ICD-10-CM | POA: Insufficient documentation

## 2017-04-25 DIAGNOSIS — R1084 Generalized abdominal pain: Secondary | ICD-10-CM

## 2017-04-25 DIAGNOSIS — D573 Sickle-cell trait: Secondary | ICD-10-CM | POA: Diagnosis not present

## 2017-04-25 DIAGNOSIS — M25612 Stiffness of left shoulder, not elsewhere classified: Secondary | ICD-10-CM

## 2017-04-25 DIAGNOSIS — R102 Pelvic and perineal pain: Secondary | ICD-10-CM | POA: Diagnosis not present

## 2017-04-25 DIAGNOSIS — G8929 Other chronic pain: Secondary | ICD-10-CM

## 2017-04-25 DIAGNOSIS — R300 Dysuria: Secondary | ICD-10-CM | POA: Diagnosis not present

## 2017-04-25 DIAGNOSIS — M6281 Muscle weakness (generalized): Secondary | ICD-10-CM

## 2017-04-25 LAB — POCT URINALYSIS DIP (DEVICE)
BILIRUBIN URINE: NEGATIVE
Glucose, UA: NEGATIVE mg/dL
HGB URINE DIPSTICK: NEGATIVE
KETONES UR: NEGATIVE mg/dL
Nitrite: NEGATIVE
PH: 7 (ref 5.0–8.0)
PROTEIN: NEGATIVE mg/dL
Specific Gravity, Urine: 1.02 (ref 1.005–1.030)
Urobilinogen, UA: 0.2 mg/dL (ref 0.0–1.0)

## 2017-04-25 MED ORDER — AZITHROMYCIN 250 MG PO TABS
ORAL_TABLET | ORAL | Status: AC
  Filled 2017-04-25: qty 4

## 2017-04-25 MED ORDER — CEFTRIAXONE SODIUM 250 MG IJ SOLR
INTRAMUSCULAR | Status: AC
  Filled 2017-04-25: qty 250

## 2017-04-25 MED ORDER — AZITHROMYCIN 250 MG PO TABS
1000.0000 mg | ORAL_TABLET | Freq: Once | ORAL | Status: AC
  Administered 2017-04-25: 1000 mg via ORAL

## 2017-04-25 MED ORDER — CEFTRIAXONE SODIUM 250 MG IJ SOLR
250.0000 mg | Freq: Once | INTRAMUSCULAR | Status: AC
  Administered 2017-04-25: 250 mg via INTRAMUSCULAR

## 2017-04-25 NOTE — ED Triage Notes (Signed)
Pt sts dysuria and lower abd pain; pt sts hx of same

## 2017-04-25 NOTE — Therapy (Signed)
[place], [name], [address] Phone: [phone]   Fax:  [phone]  Physical Therapy Treatment  Patient Details  Name: Daniel Schneider MRN: [id number] Date of Birth: 02-07-1976 Referring Provider: Dr Clearance Coots    Encounter Date: 04/25/2017  PT End of Session - 04/25/17 1014    Visit Number  10    Number of Visits  16    Date for PT Re-Evaluation  05/17/17    PT Start Time  0925    PT Stop Time  1010    PT Time Calculation (min)  45 min    Activity Tolerance  Patient tolerated treatment well    Behavior During Therapy  [facility] for tasks assessed/performed       Past Medical History:  Diagnosis Date  . Cryptogenic stroke ([place]) 05/2015   [name] 05/27/2015; left hand weaker since (05/26/2015)  . PFO (patent foramen ovale)    a. 05/27/2015: s/p PFO closure w/ 38mm PFO occluder   . Sickle cell trait [facility])     Past Surgical History:  Procedure Laterality Date  . ASD AND VSD REPAIR  05/27/2015   [name] 05/27/2015  . CARDIAC CATHETERIZATION N/A 05/27/2015   Procedure: ASD/VSD Closure;  Surgeon: Sherren Mocha, MD;  Location: [place] CV LAB;  Service: Cardiovascular;  Laterality: N/A;  . TEE WITHOUT CARDIOVERSION N/A 04/21/2015   Procedure: TRANSESOPHAGEAL ECHOCARDIOGRAM (TEE);  Surgeon: Sueanne Margarita, MD;  Location: [facility] ENDOSCOPY;  Service: Cardiovascular;  Laterality: N/A;    There were no vitals filed for this visit.  Subjective Assessment - 04/25/17 0932    Subjective  I am able to use the bands some.  No pain now at rest.   Pain up to 2 /10 yesterday.    Currently in Pain?  No/denies    Pain Score  -- up to 2/10    Pain Location  Shoulder    Pain Orientation  Left    Pain Descriptors / Indicators  Sore;Sharp    Aggravating Factors   moving a certain way.      Pain Relieving Factors  stretching way     Effect of Pain on Daily Activities  limited by protocol    Multiple Pain Sites  No          [facility] PT Assessment - 04/25/17 0001      PROM   Left Shoulder Flexion  156 Degrees                  [facility] Adult PT Treatment/Exercise - 04/25/17 0001      Shoulder Exercises: Supine   External Rotation  10 reps;AAROM cane,  cued for shoulder position    Flexion  5 reps cane.  pan increased to 2/10 so stopped with his guarding in    Other Supine Exercises  tirceps      Shoulder Exercises: Standing   Extension  20 reps;Theraband    Theraband Level (Shoulder Extension)  Level 1 (Yellow)    Row  20 reps    Retraction  --    Theraband Level (Shoulder Retraction)  --      Shoulder Exercises: Isometric Strengthening   Flexion Limitations  10 x 5 seconds,  cued for pain free    External Rotation Limitations  10 x 5 sceonds, cued light pressure,  pain free HEP    Internal Rotation Limitations  10 x 5 seconds,  cued for painfree    ABduction Limitations  10  x 5 seconds,  cued pain free      Manual Therapy   Manual therapy comments  PROM per protocol    Joint Mobilization  distraction with range    Soft tissue mobilization  teres,  myofascial release anterior shoulder             PT Education - 04/25/17 1014    Education Details  expected range    Person(s) Educated  Patient       PT Short Term Goals - 04/25/17 1016      PT SHORT TERM GOAL #1   Title  Patient will increase shoulder flexion by 30 degrees     Time  4    Period  Weeks    Status  Achieved      PT SHORT TERM GOAL #2   Title  Patient will increase his left shoulder ER by 25 degrees     Time  4    Period  Weeks    Status  Achieved      PT SHORT TERM GOAL #3   Baseline  not tested     Time  4    Period  Weeks    Status  On-going      PT SHORT TERM GOAL #4   Title  Patients will independent with HEP     Baseline  needs cues to be compliant with isometrics    Time  4    Period  Weeks    Status  On-going        PT Long Term Goals - 03/22/17 2143      PT LONG TERM GOAL #1    Title  Patient will reach behind his back to L3 without pain     Time  8    Period  Weeks    Status  New    Target Date  05/17/17      PT LONG TERM GOAL #2   Title  Patient will reach behind his head without pain in order to perfrom hygien tasks     Time  8    Target Date  04/19/17      PT LONG TERM GOAL #3   Title  Patient will reach overhead to a shelf with a 2lb weight without pain     Time  8    Period  Weeks    Status  New    Target Date  04/19/17            Plan - 04/25/17 1015    Clinical Impression Statement  Patient 156 AAROM flexion . Pain 2/10 with exercise.  He has not been doing the isometrics at home and likes to use the bands.  He still needs encouragement to not put himself into pain with exercise.    PT Next Visit Plan  Continue into week 5 of protocol (surgery Feb 5th) review isometrics.  progress AA ROM,  scapular strengthening  gentle    PT Home Exercise Plan  pendulum exer , scapula retraction,  isometrics sub max       Patient will benefit from skilled therapeutic intervention in order to improve the following deficits and impairments:     Visit Diagnosis: Chronic left shoulder pain  Stiffness of left shoulder, not elsewhere classified  Muscle weakness (generalized)     Problem List Patient Active Problem List   Diagnosis Date Noted  . Anxiety 08/03/2016  . Cerebrovascular accident (CVA) due to embolism of right middle cerebral artery (  [place]) 05/30/2015  . Prediabetes 05/27/2015  . Sickle cell trait ([place]) 05/26/2015  . PFO (patent foramen ovale)   . Acute CVA (cerebrovascular accident) ([place]) 04/20/2015  . Stroke ([place])   . HLD (hyperlipidemia)     HARRIS,KAREN  PTA 04/25/2017, 10:18 AM  [facility] [address] [place], [name], [address] Phone: [phone]   Fax:  [phone]  Name: Daniel Schneider MRN: [id number] Date of Birth: 12/01/1975

## 2017-04-25 NOTE — ED Provider Notes (Signed)
  MRN: [id number] DOB: Apr 03, 1975  Subjective:   Daniel Schneider is a 42 y.o. male presenting for 2 day history of dysuria, pelvic pain and penile discharge, pelvic discomfort, nausea. Has tried probiotic with minimal relief. Denies fever, hematuria, urinary frequency, urinary urgency, flank pain, abdominal pain, cloudy malordorous urine and genital rash. Patient is sexually active with his girlfriend, does not use condoms for protection. Does not hydrate well, drinks fruit juices instead. Does not drink alcohol. Tries to eat healthily eating fruits, salads, vegetables. Does not have bowel movements every day. He has been using hydrocodone for pain for his recent arm surgery.   Daniel Schneider is not currently taking any medications and has No Known Allergies. Daniel Schneider  has a past medical history of Cryptogenic stroke ([place]) (05/2015), PFO (patent foramen ovale), and Sickle cell trait ([place]). Also  has a past surgical history that includes TEE without cardioversion (N/A, 04/21/2015); ASD and VSD repair (05/27/2015); and Cardiac catheterization (N/A, 05/27/2015).  Objective:   Vitals: BP (!) 146/76 (BP Location: Right Arm)   Pulse 64   Temp 98.5 F (36.9 C) (Oral)   Resp 18   SpO2 100%   Physical Exam  Constitutional: He is oriented to person, place, and time. He appears well-developed and well-nourished.  HENT:  Mouth/Throat: Oropharynx is clear and moist.  Cardiovascular: Normal rate, regular rhythm and intact distal pulses. Exam reveals no gallop and no friction rub.  No murmur heard. Pulmonary/Chest: Effort normal. No respiratory distress. He has no wheezes. He has no rales.  Abdominal: Soft. Bowel sounds are normal. He exhibits no distension and no mass. There is tenderness (generalized throughout). There is no guarding.  Neurological: He is alert and oriented to person, place, and time.  Skin: Skin is warm and dry.  Psychiatric: He has a normal mood and affect.   Results for orders placed or  performed during the hospital encounter of 04/25/17 (from the past 24 hour(s))  POCT urinalysis dip (device)     Status: Abnormal   Collection Time: 04/25/17 11:17 AM  Result Value Ref Range   Glucose, UA NEGATIVE NEGATIVE mg/dL   Bilirubin Urine NEGATIVE NEGATIVE   Ketones, ur NEGATIVE NEGATIVE mg/dL   Specific Gravity, Urine 1.020 1.005 - 1.030   Hgb urine dipstick NEGATIVE NEGATIVE   pH 7.0 5.0 - 8.0   Protein, ur NEGATIVE NEGATIVE mg/dL   Urobilinogen, UA 0.2 0.0 - 1.0 mg/dL   Nitrite NEGATIVE NEGATIVE   Leukocytes, UA SMALL (A) NEGATIVE   Assessment and Plan :   Dysuria  Penile discharge  Constipation, unspecified constipation type  Generalized abdominal pain  Pelvic pain in male  Will treat empirically for STI given penile discharge, dysuria, unprotected sex. Counseled on hydration, constipation management and risk of using hydrocodone. Return-to-clinic precautions discussed, patient verbalized understanding.    Jaynee Eagles, [place] 04/25/17 1211

## 2017-04-26 LAB — URINE CYTOLOGY ANCILLARY ONLY
CHLAMYDIA, DNA PROBE: POSITIVE — AB
NEISSERIA GONORRHEA: NEGATIVE
TRICH (WINDOWPATH): NEGATIVE

## 2017-04-26 LAB — URINE CULTURE: CULTURE: NO GROWTH

## 2017-04-27 ENCOUNTER — Encounter: Payer: Self-pay | Admitting: Physical Therapy

## 2017-04-27 ENCOUNTER — Ambulatory Visit: Payer: BLUE CROSS/BLUE SHIELD | Admitting: Physical Therapy

## 2017-04-27 DIAGNOSIS — M25612 Stiffness of left shoulder, not elsewhere classified: Secondary | ICD-10-CM

## 2017-04-27 DIAGNOSIS — M25512 Pain in left shoulder: Secondary | ICD-10-CM | POA: Diagnosis not present

## 2017-04-27 DIAGNOSIS — M6281 Muscle weakness (generalized): Secondary | ICD-10-CM

## 2017-04-27 DIAGNOSIS — G8929 Other chronic pain: Secondary | ICD-10-CM

## 2017-04-27 NOTE — Therapy (Signed)
[place], [name], [address] Phone: [phone]   Fax:  [phone]  Physical Therapy Treatment  Patient Details  Name: Daniel Schneider MRN: [id number] Date of Birth: 1975-04-25 Referring Provider: Dr Clearance Coots    Encounter Date: 04/27/2017  PT End of Session - 04/27/17 0945    Visit Number  11    Number of Visits  16    Date for PT Re-Evaluation  05/17/17    Authorization Type  [employer]     PT Start Time  0930    PT Stop Time  1025    PT Time Calculation (min)  55 min       Past Medical History:  Diagnosis Date  . Cryptogenic stroke ([place]) 05/2015   [name] 05/27/2015; left hand weaker since (05/26/2015)  . PFO (patent foramen ovale)    a. 05/27/2015: s/p PFO closure w/ 24mm PFO occluder   . Sickle cell trait [facility])     Past Surgical History:  Procedure Laterality Date  . ASD AND VSD REPAIR  05/27/2015   [name] 05/27/2015  . CARDIAC CATHETERIZATION N/A 05/27/2015   Procedure: ASD/VSD Closure;  Surgeon: Sherren Mocha, MD;  Location: [facility] CV LAB;  Service: Cardiovascular;  Laterality: N/A;  . TEE WITHOUT CARDIOVERSION N/A 04/21/2015   Procedure: TRANSESOPHAGEAL ECHOCARDIOGRAM (TEE);  Surgeon: Sueanne Margarita, MD;  Location: [facility] ENDOSCOPY;  Service: Cardiovascular;  Laterality: N/A;    There were no vitals filed for this visit.  Subjective Assessment - 04/27/17 0934    Subjective  Hurting a little this morning. I think I spept on it some.     Currently in Pain?  Yes    Pain Score  3     Pain Location  Shoulder    Pain Orientation  Left    Pain Descriptors / Indicators  Sore stiff         [facility] PT Assessment - 04/27/17 0001      PROM   Left Shoulder External Rotation  50 Degrees                  [facility] Adult PT Treatment/Exercise - 04/27/17 0001      Shoulder Exercises: Supine   Other Supine Exercises  chest press and pullovers x 10 each tolerated well       Shoulder  Exercises: Standing   Extension  20 reps;Theraband    Theraband Level (Shoulder Extension)  Level 1 (Yellow)    Row  20 reps    Theraband Level (Shoulder Retraction)  Level 2 (Red)    Other Standing Exercises  Standing wall ladder x 5 , wall slides x 5       Shoulder Exercises: Pulleys   Flexion  2 minutes      Shoulder Exercises: Isometric Strengthening   Flexion Limitations  10 x 5 seconds,  cued for pain free    External Rotation Limitations  10 x 5 sceonds, cued light pressure,  pain free HEP    Internal Rotation Limitations  10 x 5 seconds,  cued for painfree    ABduction Limitations  10 x 5 seconds,  cued pain free      Vasopneumatic   Number Minutes Vasopneumatic   15 minutes    Vasopnuematic Location   Shoulder    Vasopneumatic Pressure  Medium    Vasopneumatic Temperature   34      Manual Therapy   Manual therapy comments  PROM per protocol  PT Short Term Goals - 04/25/17 1016      PT SHORT TERM GOAL #1   Title  Patient will increase shoulder flexion by 30 degrees     Time  4    Period  Weeks    Status  Achieved      PT SHORT TERM GOAL #2   Title  Patient will increase his left shoulder ER by 25 degrees     Time  4    Period  Weeks    Status  Achieved      PT SHORT TERM GOAL #3   Baseline  not tested     Time  4    Period  Weeks    Status  On-going      PT SHORT TERM GOAL #4   Title  Patients will independent with HEP     Baseline  needs cues to be compliant with isometrics    Time  4    Period  Weeks    Status  On-going        PT Long Term Goals - 03/22/17 2143      PT LONG TERM GOAL #1   Title  Patient will reach behind his back to L3 without pain     Time  8    Period  Weeks    Status  New    Target Date  05/17/17      PT LONG TERM GOAL #2   Title  Patient will reach behind his head without pain in order to perfrom hygien tasks     Time  8    Target Date  04/19/17      PT LONG TERM GOAL #3   Title  Patient will  reach overhead to a shelf with a 2lb weight without pain     Time  8    Period  Weeks    Status  New    Target Date  04/19/17            Plan - 04/27/17 0955    Clinical Impression Statement  Therapy added wall ladder/wall slides with good tolerance. Continued with isometrics for GHJ strengthening and therabands for scapular strength. Less pain with supine cane exercises today.     PT Next Visit Plan  Continue into week 6 of protocol (surgery Feb 5th) review isometrics.  progress AA ROM,  scapular strengthening  gentle, add wall slide      PT Home Exercise Plan  pendulum exer , scapula retraction,  isometrics sub max    Consulted and Agree with Plan of Care  Patient       Patient will benefit from skilled therapeutic intervention in order to improve the following deficits and impairments:  Pain, Impaired UE functional use, Decreased activity tolerance, Decreased endurance, Decreased range of motion, Decreased strength  Visit Diagnosis: Chronic left shoulder pain  Stiffness of left shoulder, not elsewhere classified  Muscle weakness (generalized)     Problem List Patient Active Problem List   Diagnosis Date Noted  . Anxiety 08/03/2016  . Cerebrovascular accident (CVA) due to embolism of right middle cerebral artery ([place]) 05/30/2015  . Prediabetes 05/27/2015  . Sickle cell trait ([place]) 05/26/2015  . PFO (patent foramen ovale)   . Acute CVA (cerebrovascular accident) ([place]) 04/20/2015  . Stroke ([place])   . HLD (hyperlipidemia)     Dorene Ar, PTA 04/27/2017, 10:36 AM  [facility] [address] [place], [name], [address] Phone:  [phone]   Fax:  [phone]  Name: Daniel Schneider MRN: [id number] Date of Birth: 1975-03-01

## 2017-05-02 ENCOUNTER — Encounter: Payer: Self-pay | Admitting: Physical Therapy

## 2017-05-02 ENCOUNTER — Ambulatory Visit: Payer: BLUE CROSS/BLUE SHIELD | Admitting: Physical Therapy

## 2017-05-02 DIAGNOSIS — M25512 Pain in left shoulder: Secondary | ICD-10-CM | POA: Diagnosis not present

## 2017-05-02 DIAGNOSIS — M6281 Muscle weakness (generalized): Secondary | ICD-10-CM

## 2017-05-02 DIAGNOSIS — G8929 Other chronic pain: Secondary | ICD-10-CM

## 2017-05-02 DIAGNOSIS — M25612 Stiffness of left shoulder, not elsewhere classified: Secondary | ICD-10-CM

## 2017-05-02 NOTE — Therapy (Signed)
[place], [name], [address] Phone: [phone]   Fax:  [phone]  Physical Therapy Treatment  Patient Details  Name: Daniel Schneider MRN: [id number] Date of Birth: [birth year] Referring Provider: Dr Clearance Coots    Encounter Date: 05/02/2017  PT End of Session - 05/02/17 1120    Visit Number  12    Number of Visits  16    Date for PT Re-Evaluation  05/17/17    PT Start Time  0940    PT Stop Time  1030    PT Time Calculation (min)  50 min    Activity Tolerance  Patient tolerated treatment well    Behavior During Therapy  [facility] for tasks assessed/performed       Past Medical History:  Diagnosis Date  . Cryptogenic stroke ([place]) 05/2015   [name] 05/27/2015; left hand weaker since (05/26/2015)  . PFO (patent foramen ovale)    a. 05/27/2015: s/p PFO closure w/ 62m PFO occluder   . Sickle cell trait ([facility]     Past Surgical History:  Procedure Laterality Date  . ASD AND VSD REPAIR  05/27/2015   /[name]4/18/2017  . CARDIAC CATHETERIZATION N/A 05/27/2015   Procedure: ASD/VSD Closure;  Surgeon: MSherren Mocha MD;  [facility]  Service: Cardiovascular;  Laterality: N/A;  . TEE WITHOUT CARDIOVERSION N/A 04/21/2015   Procedure: TRANSESOPHAGEAL ECHOCARDIOGRAM (TEE);  Surgeon: TSueanne Margarita MD;  [facility]  Service: Cardiovascular;  Laterality: N/A;    There were no vitals filed for this visit.  Subjective Assessment - 05/02/17 0942    Subjective  No pain.  I see the MD Friday and he is going to tell me to rtake the sling off  :).    Currently in Pain?  No/denies    Pain Orientation  Left    Pain Type  Acute pain;Chronic pain    Pain Frequency  Intermittent    Aggravating Factors   sleepin gon shoulder    Pain Relieving Factors  stretches         [facility] PT Assessment - 05/02/17 0001      PROM   Left Shoulder Flexion  170 Degrees AA on wall ladder             No data recorded       [facility] Adult PT Treatment/Exercise - 05/02/17 0001      Shoulder Exercises: Standing   Extension  15 reps    Theraband Level (Shoulder Extension)  Level 3 (Green)    Row  20 reps    Theraband Level (Shoulder Retraction)  Level 3 (Green)    Other Standing Exercises  standing wall ladder 5 X,  goes to the top  pain3/10      Shoulder Exercises: Isometric Strengthening   Flexion Limitations  10 x 5 seconds,  cued for pain free    External Rotation Limitations  10 x 5 sceonds, cued light pressure,  pain free HEP    Internal Rotation Limitations  10 x 5 seconds,  cued for painfree    ABduction Limitations  10 x 5 seconds,  cued pain free      Wrist Exercises   Wrist Flexion  10 reps    Bar Weights/Barbell (Wrist Flexion)  4 lbs    Wrist Flexion Limitations  forearm resting on thigh for all    Wrist Extension  10 reps    Bar Weights/Barbell (Wrist Extension)  4 lbs  Wrist Radial Deviation  10 reps    Bar Weights/Barbell (Radial Deviation)  4 lbs    Other wrist exercises  supination/pronation  10 X 4 LB BARBELL      Vasopneumatic   Number Minutes Vasopneumatic   15 minutes    Vasopnuematic Location   Shoulder    Vasopneumatic Pressure  Medium    Vasopneumatic Temperature   34      Manual Therapy   Manual Therapy  Soft tissue mobilization    Manual therapy comments  PROM      Soft tissue mobilization  teres,  myofascial release anterior shoulder               PT Short Term Goals - 05/02/17 1118      PT SHORT TERM GOAL #1   Title  Patient will increase shoulder flexion by 30 degrees     Time  4    Period  Weeks    Status  Achieved      PT SHORT TERM GOAL #2   Time  4    Period  Weeks    Status  Achieved      PT SHORT TERM GOAL #3   Title  Patient will increase gross left shoulder strength to 3+/5     Baseline  not tested     Time  4    Period  Weeks    Status  On-going      PT SHORT TERM GOAL #4   Title  Patients  will independent with HEP     Baseline  independent    Time  4    Period  Weeks    Status  On-going        PT Long Term Goals - 03/22/17 2143      PT LONG TERM GOAL #1   Title  Patient will reach behind his back to L3 without pain     Time  8    Period  Weeks    Status  New    Target Date  05/17/17      PT LONG TERM GOAL #2   Title  Patient will reach behind his head without pain in order to perfrom hygien tasks     Time  8    Target Date  04/19/17      PT LONG TERM GOAL #3   Title  Patient will reach overhead to a shelf with a 2lb weight without pain     Time  8    Period  Weeks    Status  New    Target Date  04/19/17            Plan - 05/02/17 1120    Clinical Impression Statement  Patien now able to go to the top of wall ladder with 170 degrees shoulder flexion.   Pain with exercise 4/10.  Manual continues to be helpful anterior and posterior shoulder.   STG#4 met.  MD appointment on Friday this week. Patient tolerated increased resistance with band without increased pain.   PT Next Visit Plan  Continue into week 8thweek of protocol (surgery Feb 5th) review isometrics.  progress AA ROM,  scapular strengthening  gentle, add wall slide      PT Home Exercise Plan  pendulum exer , scapula retraction,  isometrics sub max    Consulted and Agree with Plan of Care  Patient       Patient will benefit from skilled therapeutic intervention in order to  improve the following deficits and impairments:     Visit Diagnosis: Chronic left shoulder pain  Stiffness of left shoulder, not elsewhere classified  Muscle weakness (generalized)     Problem List Patient Active Problem List   Diagnosis Date Noted  . Anxiety 08/03/2016  . Cerebrovascular accident (CVA) due to embolism of right middle cerebral artery ([place]) 05/30/2015  . Prediabetes 05/27/2015  . Sickle cell trait ([place]) 05/26/2015  . PFO (patent foramen ovale)   . Acute CVA (cerebrovascular accident) ([place])  04/20/2015  . Stroke ([place])   . HLD (hyperlipidemia)     HARRIS,KAREN  PTA 05/02/2017, 11:25 AM  [facility] [address] [place], [name], [address] Phone: [phone]   Fax:  [phone]  Name: Daniel Schneider MRN: [id number] Date of Birth: [birth year]

## 2017-05-04 ENCOUNTER — Ambulatory Visit: Payer: BLUE CROSS/BLUE SHIELD | Admitting: Physical Therapy

## 2017-05-04 ENCOUNTER — Encounter: Payer: Self-pay | Admitting: Physical Therapy

## 2017-05-04 DIAGNOSIS — M25612 Stiffness of left shoulder, not elsewhere classified: Secondary | ICD-10-CM

## 2017-05-04 DIAGNOSIS — M25512 Pain in left shoulder: Principal | ICD-10-CM

## 2017-05-04 DIAGNOSIS — M6281 Muscle weakness (generalized): Secondary | ICD-10-CM

## 2017-05-04 DIAGNOSIS — G8929 Other chronic pain: Secondary | ICD-10-CM

## 2017-05-04 NOTE — Therapy (Addendum)
[place], [name], [address] Phone: [phone]   Fax:  [phone]  Physical Therapy Treatment  Patient Details  Name: Daniel Schneider MRN: [id number] Date of Birth: July 29, 1975 Referring Provider: Dr Clearance Coots    Encounter Date: 05/04/2017  PT End of Session - 05/04/17 0940    Visit Number  13    Number of Visits  21    Date for PT Re-Evaluation  05/17/17    Authorization Type  BCBS     PT Start Time  [phone]    PT Stop Time  1016    PT Time Calculation (min)  38 min    Activity Tolerance  Patient tolerated treatment well    Behavior During Therapy  [facility] for tasks assessed/performed       Past Medical History:  Diagnosis Date  . Cryptogenic stroke ([place]) 05/2015   [name] 05/27/2015; left hand weaker since (05/26/2015)  . PFO (patent foramen ovale)    a. 05/27/2015: s/p PFO closure w/ 29mm PFO occluder   . Sickle cell trait [facility])     Past Surgical History:  Procedure Laterality Date  . ASD AND VSD REPAIR  05/27/2015   [name] 05/27/2015  . CARDIAC CATHETERIZATION N/A 05/27/2015   Procedure: ASD/VSD Closure;  Surgeon: Sherren Mocha, MD;  Location: [place] [facility];  Service: Cardiovascular;  Laterality: N/A;  . TEE WITHOUT CARDIOVERSION N/A 04/21/2015   Procedure: TRANSESOPHAGEAL ECHOCARDIOGRAM (TEE);  Surgeon: Sueanne Margarita, MD;  Location: [facility] ENDOSCOPY;  Service: Cardiovascular;  Laterality: N/A;    There were no vitals filed for this visit.  Subjective Assessment - 05/04/17 1528    Subjective  Patient reports he thinkshe slept on it again. He is having pain around the [facility] joint and into the lateral deltoid. He reports he has not been using it.     Currently in Pain?  Yes    Pain Score  4     Pain Location  Shoulder    Pain Orientation  Left    Pain Descriptors / Indicators  Aching    Pain Type  Acute pain;Surgical pain    Pain Onset  More than a month ago    Pain Frequency  Intermittent     Aggravating Factors   sleeping on the shoulder     Pain Relieving Factors  stretches     Effect of Pain on Daily Activities  limited by protocol     Multiple Pain Sites  No         [facility] PT Assessment - 05/04/17 0001      PROM   Left Shoulder Flexion  170 Degrees AA on wall ladder    Left Shoulder External Rotation  55 Degrees      Palpation   Palpation comment  tenderness to palpation the lateral shoulder            No data recorded               PT Education - 05/04/17 1002    Education provided  Yes    Education Details  gave patient 4 exercises to work on over the next week     Person(s) Educated  Patient    Methods  Explanation;Demonstration;Tactile cues;Verbal cues    Comprehension  Verbalized understanding;Returned demonstration;Verbal cues required;Tactile cues required;Need further instruction       PT Short Term Goals - 05/02/17 1118      PT SHORT TERM GOAL #1  Title  Patient will increase shoulder flexion by 30 degrees     Time  4    Period  Weeks    Status  Achieved      PT SHORT TERM GOAL #2   Time  4    Period  Weeks    Status  Achieved      PT SHORT TERM GOAL #3   Title  Patient will increase gross left shoulder strength to 3+/5     Baseline  not tested     Time  4    Period  Weeks    Status  On-going      PT SHORT TERM GOAL #4   Title  Patients will independent with HEP     Baseline  independent    Time  4    Period  Weeks    Status  On-going        PT Long Term Goals - 03/22/17 2143      PT LONG TERM GOAL #1   Title  Patient will reach behind his back to L3 without pain     Time  8    Period  Weeks    Status  New    Target Date  05/17/17      PT LONG TERM GOAL #2   Title  Patient will reach behind his head without pain in order to perfrom hygien tasks     Time  8    Target Date  04/19/17      PT LONG TERM GOAL #3   Title  Patient will reach overhead to a shelf with a 2lb weight without pain     Time  8     Period  Weeks    Status  New    Target Date  04/19/17            Plan - 05/04/17 0942    Clinical Impression Statement  Patient was advised to do light activity over the next few days. He has likley just irritated his shoulder. He was given 4 exercises which he had no pain with to do until PTsees him again. His motion is improving. He is still lacking end range ER. It is improving as expected. Therapy is progressing strengthening per protocol. Therapy backtracked a bit today to allow for decrease in inflammation. Therapy also perfromed ultrasound today. Hopefully by next visit we will be able to begin our strength porgression again.     Clinical Presentation  Stable    Clinical Decision Making  Low    Rehab Potential  Excellent    PT Frequency  2x / week    PT Duration  8 weeks    PT Treatment/Interventions  ADLs/Self Care Home Management;Cryotherapy;Electrical Stimulation;Therapeutic activities;Therapeutic exercise;Neuromuscular re-education;Patient/family education;Manual techniques;Taping;Passive range of motion    PT Next Visit Plan  Continue into week 8thweek of protocol (surgery Feb 5th) review isometrics.  progress AA ROM,  scapular strengthening  gentle, add wall slide      PT Home Exercise Plan  pendulum exer , scapula retraction,  isometrics sub max    Consulted and Agree with Plan of Care  Patient       Patient will benefit from skilled therapeutic intervention in order to improve the following deficits and impairments:  Pain, Impaired UE functional use, Decreased activity tolerance, Decreased endurance, Decreased range of motion, Decreased strength  Visit Diagnosis: Chronic left shoulder pain  Stiffness of left shoulder, not elsewhere classified  Muscle weakness (generalized)  Problem List Patient Active Problem List   Diagnosis Date Noted  . Anxiety 08/03/2016  . Cerebrovascular accident (CVA) due to embolism of right middle cerebral artery ([place]) 05/30/2015   . Prediabetes 05/27/2015  . Sickle cell trait ([place]) 05/26/2015  . PFO (patent foramen ovale)   . Acute CVA (cerebrovascular accident) ([place]) 04/20/2015  . Stroke ([place])   . HLD (hyperlipidemia)     Carney Living PT DPT  05/04/2017, 3:40 PM   During this treatment session, the therapist was present, participating in and directing the treatment.  Cooper Render [facility]  05/04/2017   [place] [facility] [place], [name], [address] Phone: [phone]   Fax:  [phone]  Name: BRYDEN DARDEN MRN: [id number] Date of Birth: Jan 11, 1976

## 2017-05-10 ENCOUNTER — Encounter: Payer: Self-pay | Admitting: Physical Therapy

## 2017-05-10 ENCOUNTER — Ambulatory Visit: Payer: BLUE CROSS/BLUE SHIELD | Attending: Specialist | Admitting: Physical Therapy

## 2017-05-10 DIAGNOSIS — M25612 Stiffness of left shoulder, not elsewhere classified: Secondary | ICD-10-CM

## 2017-05-10 DIAGNOSIS — M25512 Pain in left shoulder: Secondary | ICD-10-CM | POA: Diagnosis present

## 2017-05-10 DIAGNOSIS — M6281 Muscle weakness (generalized): Secondary | ICD-10-CM | POA: Insufficient documentation

## 2017-05-10 DIAGNOSIS — G8929 Other chronic pain: Secondary | ICD-10-CM | POA: Insufficient documentation

## 2017-05-10 NOTE — Therapy (Signed)
[place], [name], [address] Phone: [phone]   Fax:  [phone]  Physical Therapy Treatment  Patient Details  Name: Daniel Schneider MRN: [id number] Date of Birth: [birth year] Referring Provider: Dr Clearance Coots    Encounter Date: 05/10/2017  PT End of Session - 05/10/17 1419    Visit Number  14    Number of Visits  21    Date for PT Re-Evaluation  05/17/17    PT Start Time  1419 pt arrived 5 min late    PT Stop Time  1508    PT Time Calculation (min)  49 min    Activity Tolerance  Patient tolerated treatment well    Behavior During Therapy  [facility] for tasks assessed/performed       Past Medical History:  Diagnosis Date  . Cryptogenic stroke ([place]) 05/2015   [name] 05/27/2015; left hand weaker since (05/26/2015)  . PFO (patent foramen ovale)    a. 05/27/2015: s/p PFO closure w/ 72mm PFO occluder   . Sickle cell trait [facility])     Past Surgical History:  Procedure Laterality Date  . ASD AND VSD REPAIR  05/27/2015   [name] 05/27/2015  . CARDIAC CATHETERIZATION N/A 05/27/2015   Procedure: ASD/VSD Closure;  Surgeon: Sherren Mocha, MD;  Location: [place] [facility];  Service: Cardiovascular;  Laterality: N/A;  . TEE WITHOUT CARDIOVERSION N/A 04/21/2015   Procedure: TRANSESOPHAGEAL ECHOCARDIOGRAM (TEE);  Surgeon: Sueanne Margarita, MD;  Location: [facility] ENDOSCOPY;  Service: Cardiovascular;  Laterality: N/A;    There were no vitals filed for this visit.  Subjective Assessment - 05/10/17 1419    Subjective  " no pain today, just sore to the touch in some spots"    Currently in Pain?  No/denies                       [facility] Adult PT Treatment/Exercise - 05/10/17 1422      Shoulder Exercises: Seated   Other Seated Exercises  seated self distraction mob holding on table 2 x 10      Shoulder Exercises: Standing   External Rotation  10 reps;Theraband;Left;Strengthening    Theraband Level (Shoulder  External Rotation)  Level 1 (Yellow)    Internal Rotation  10 reps;Theraband;Left;Strengthening with yellow theraband    Extension  15 reps    Theraband Level (Shoulder Extension)  Level 3 (Green)      Shoulder Exercises: ROM/Strengthening   UBE (Upper Arm Bike)  L1 x 6 min  changing direction at 3 min, cues for slow steady pace    Other ROM/Strengthening Exercises  wall ladder 1 x 5 flexion/ 1 x 5 abduction      Shoulder Exercises: Stretch   Other Shoulder Stretches  supined pec stretch 2 x 30 sec     Other Shoulder Stretches  rhomboid stretching 2 x 30 sec with hands clasped      Vasopneumatic   Number Minutes Vasopneumatic   10 minutes    Vasopnuematic Location   Shoulder    Vasopneumatic Pressure  Medium    Vasopneumatic Temperature   34      Manual Therapy   Manual Therapy  Scapular mobilization    Joint Mobilization  grade 3 distal clavicle mobs     Scapular Mobilization  upward scapular assist during wall ladder ROM activity.               PT Short Term Goals - 05/02/17  38      PT SHORT TERM GOAL #1   Title  Patient will increase shoulder flexion by 30 degrees     Time  4    Period  Weeks    Status  Achieved      PT SHORT TERM GOAL #2   Time  4    Period  Weeks    Status  Achieved      PT SHORT TERM GOAL #3   Title  Patient will increase gross left shoulder strength to 3+/5     Baseline  not tested     Time  4    Period  Weeks    Status  On-going      PT SHORT TERM GOAL #4   Title  Patients will independent with HEP     Baseline  independent    Time  4    Period  Weeks    Status  On-going        PT Long Term Goals - 03/22/17 2143      PT LONG TERM GOAL #1   Title  Patient will reach behind his back to L3 without pain     Time  8    Period  Weeks    Status  New    Target Date  05/17/17      PT LONG TERM GOAL #2   Title  Patient will reach behind his head without pain in order to perfrom hygien tasks     Time  8    Target Date   04/19/17      PT LONG TERM GOAL #3   Title  Patient will reach overhead to a shelf with a 2lb weight without pain     Time  8    Period  Weeks    Status  New    Target Date  04/19/17            Plan - 05/10/17 1501    Clinical Impression Statement  No pain today except for point tenderness in the at the [facility] joint. worked on distal clavicle mobs and scapular mobs with active flexion/ abduction techniques to promote scapulohumeral rhythm. began isotonics with shoulder ER/ IR monitoring for pain. Vaso end of session to calm down soreness end of session.     PT Next Visit Plan  Continue into week 8thweek of protocol (surgery Feb 5th) review isometrics.  progress AA ROM,  scapular strengthening  gentle, add wall slide,  how were shoulder IR/ER    PT Home Exercise Plan  pendulum exer , scapula retraction,  isometrics sub max    Consulted and Agree with Plan of Care  Patient       Patient will benefit from skilled therapeutic intervention in order to improve the following deficits and impairments:  Pain, Impaired UE functional use, Decreased activity tolerance, Decreased endurance, Decreased range of motion, Decreased strength  Visit Diagnosis: Chronic left shoulder pain  Stiffness of left shoulder, not elsewhere classified  Muscle weakness (generalized)     Problem List Patient Active Problem List   Diagnosis Date Noted  . Anxiety 08/03/2016  . Cerebrovascular accident (CVA) due to embolism of right middle cerebral artery ([place]) 05/30/2015  . Prediabetes 05/27/2015  . Sickle cell trait ([place]) 05/26/2015  . PFO (patent foramen ovale)   . Acute CVA (cerebrovascular accident) ([place]) 04/20/2015  . Stroke ([place])   . HLD (hyperlipidemia)    Starr Lake PT, DPT, LAT, ATC  05/10/17  3:05 PM       [facility] [address] [place], [name], [address] Phone: [phone]   Fax:  [phone]  Name: Daniel Schneider MRN:  [id number] Date of Birth: [birth year]

## 2017-05-12 ENCOUNTER — Encounter: Payer: Self-pay | Admitting: Physical Therapy

## 2017-05-12 ENCOUNTER — Ambulatory Visit: Payer: BLUE CROSS/BLUE SHIELD | Admitting: Physical Therapy

## 2017-05-12 DIAGNOSIS — M25612 Stiffness of left shoulder, not elsewhere classified: Secondary | ICD-10-CM

## 2017-05-12 DIAGNOSIS — G8929 Other chronic pain: Secondary | ICD-10-CM

## 2017-05-12 DIAGNOSIS — M25512 Pain in left shoulder: Secondary | ICD-10-CM | POA: Diagnosis not present

## 2017-05-12 DIAGNOSIS — M6281 Muscle weakness (generalized): Secondary | ICD-10-CM

## 2017-05-12 NOTE — Therapy (Signed)
[place], [name], [address] Phone: [phone]   Fax:  [phone]  Physical Therapy Treatment  Patient Details  Name: Daniel Schneider MRN: [id number] Date of Birth: 11-Sep-1975 Referring Provider: Dr Clearance Coots    Encounter Date: 05/12/2017  PT End of Session - 05/12/17 1617    Visit Number  15    Number of Visits  21    Date for PT Re-Evaluation  05/17/17    Authorization Type  BCBS     PT Start Time  1417    PT Stop Time  1455    PT Time Calculation (min)  38 min    Activity Tolerance  Patient tolerated treatment well    Behavior During Therapy  [facility] for tasks assessed/performed       Past Medical History:  Diagnosis Date  . Cryptogenic stroke ([place]) 05/2015   [name] 05/27/2015; left hand weaker since (05/26/2015)  . PFO (patent foramen ovale)    a. 05/27/2015: s/p PFO closure w/ 46mm PFO occluder   . Sickle cell trait [facility])     Past Surgical History:  Procedure Laterality Date  . ASD AND VSD REPAIR  05/27/2015   [name] 05/27/2015  . CARDIAC CATHETERIZATION N/A 05/27/2015   Procedure: ASD/VSD Closure;  Surgeon: Sherren Mocha, MD;  Location: [place] CV LAB;  Service: Cardiovascular;  Laterality: N/A;  . TEE WITHOUT CARDIOVERSION N/A 04/21/2015   Procedure: TRANSESOPHAGEAL ECHOCARDIOGRAM (TEE);  Surgeon: Sueanne Margarita, MD;  Location: [facility] ENDOSCOPY;  Service: Cardiovascular;  Laterality: N/A;    There were no vitals filed for this visit.  Subjective Assessment - 05/12/17 1419    Subjective  I am doing well today, I feel like I might be pushing myself too hard on my HEP as my shoulder cramps up after and doesn't move well afterwards     Patient Stated Goals  Riding his bike     Currently in Pain?  No/denies                       [facility] Adult PT Treatment/Exercise - 05/12/17 0001      Shoulder Exercises: Supine   Other Supine Exercises  AAROM for L shoulder flexion 1x15        Shoulder Exercises: Standing   External Rotation  15 reps    Theraband Level (Shoulder External Rotation)  Level 1 (Yellow)    Internal Rotation  15 reps;Theraband    Theraband Level (Shoulder Internal Rotation)  Level 1 (Yellow)    Extension  20 reps    Theraband Level (Shoulder Extension)  Level 3 (Green)    Row  20 reps;Theraband    Theraband Level (Shoulder Row)  Level 3 (Green)    Other Standing Exercises  CW and CCW ball rolls on wall, ABCs on wall with ball at 90 degrees flexion; closed chain scap protractions 1x10      Other Standing Exercises  wall ladder to max tolerated height x5 with 10 second holds at max stretch       Manual Therapy   Manual Therapy  Joint mobilization    Manual therapy comments  separate from all other services     Joint Mobilization  grade III distal clavicle mobs, mobs for shoulder flexion       Also soft tissue mobilization for trigger points in L middle deltoid, teres minor, pectoral muscle        PT Education - 05/12/17 1616  Education provided  Yes    Education Details  benefits of manual today, iimportance of getting full AROM for flexion and abduction of shoulder in standing without compensations     Person(s) Educated  Patient    Methods  Explanation    Comprehension  Verbalized understanding;Need further instruction       PT Short Term Goals - 05/02/17 1118      PT SHORT TERM GOAL #1   Title  Patient will increase shoulder flexion by 30 degrees     Time  4    Period  Weeks    Status  Achieved      PT SHORT TERM GOAL #2   Time  4    Period  Weeks    Status  Achieved      PT SHORT TERM GOAL #3   Title  Patient will increase gross left shoulder strength to 3+/5     Baseline  not tested     Time  4    Period  Weeks    Status  On-going      PT SHORT TERM GOAL #4   Title  Patients will independent with HEP     Baseline  independent    Time  4    Period  Weeks    Status  On-going        PT Long Term Goals -  03/22/17 2143      PT LONG TERM GOAL #1   Title  Patient will reach behind his back to L3 without pain     Time  8    Period  Weeks    Status  New    Target Date  05/17/17      PT LONG TERM GOAL #2   Title  Patient will reach behind his head without pain in order to perfrom hygien tasks     Time  8    Target Date  04/19/17      PT LONG TERM GOAL #3   Title  Patient will reach overhead to a shelf with a 2lb weight without pain     Time  8    Period  Weeks    Status  New    Target Date  04/19/17            Plan - 05/12/17 1617    Clinical Impression Statement  Patient continues to display difficulty with active L shoulder flexion; focused on AAROM and extensive manual interventions to L shoulder today to attempt to address ROM limitations, otherwise continued with PRE exercises using TB and progressing as appropriate. Unable to progress flexion and ABD PRE in standing/gravity present positions due to poor ROM/unable to reach full range without significant compensations. Introduced closed chain stabilization exercises today as well with good tolerance and reports of stiffness from patient.     Rehab Potential  Excellent    PT Frequency  2x / week    PT Duration  8 weeks    PT Treatment/Interventions  ADLs/Self Care Home Management;Cryotherapy;Electrical Stimulation;Therapeutic activities;Therapeutic exercise;Neuromuscular re-education;Patient/family education;Manual techniques;Taping;Passive range of motion    PT Next Visit Plan  Continue into week 8thweek of protocol (surgery Feb 5th) review isometrics.  progress AA ROM,  scapular strengthening  gentle, add wall slide,  how were shoulder IR/ER    PT Home Exercise Plan  pendulum exer , scapula retraction,  isometrics sub max    Consulted and Agree with Plan of Care  Patient  Patient will benefit from skilled therapeutic intervention in order to improve the following deficits and impairments:  Pain, Impaired UE functional  use, Decreased activity tolerance, Decreased endurance, Decreased range of motion, Decreased strength  Visit Diagnosis: Chronic left shoulder pain  Stiffness of left shoulder, not elsewhere classified  Muscle weakness (generalized)     Problem List Patient Active Problem List   Diagnosis Date Noted  . Anxiety 08/03/2016  . Cerebrovascular accident (CVA) due to embolism of right middle cerebral artery ([place]) 05/30/2015  . Prediabetes 05/27/2015  . Sickle cell trait ([place]) 05/26/2015  . PFO (patent foramen ovale)   . Acute CVA (cerebrovascular accident) ([place]) 04/20/2015  . Stroke ([place])   . HLD (hyperlipidemia)     Deniece Ree PT, DPT, CBIS  Supplemental Physical Therapist [place]   Pager [facility] [address] [place], [name], [address] Phone: [phone]   Fax:  [phone]  Name: ERICK MURIN MRN: [id number] Date of Birth: 1975/08/26

## 2017-05-16 ENCOUNTER — Encounter: Payer: Self-pay | Admitting: Physical Therapy

## 2017-05-16 ENCOUNTER — Ambulatory Visit: Payer: BLUE CROSS/BLUE SHIELD | Admitting: Physical Therapy

## 2017-05-16 DIAGNOSIS — M25512 Pain in left shoulder: Secondary | ICD-10-CM | POA: Diagnosis not present

## 2017-05-16 DIAGNOSIS — M25612 Stiffness of left shoulder, not elsewhere classified: Secondary | ICD-10-CM

## 2017-05-16 DIAGNOSIS — M6281 Muscle weakness (generalized): Secondary | ICD-10-CM

## 2017-05-16 DIAGNOSIS — G8929 Other chronic pain: Secondary | ICD-10-CM

## 2017-05-16 NOTE — Therapy (Signed)
[place], [name], [address] Phone: [phone]   Fax:  [phone]  Physical Therapy Treatment  Patient Details  Name: Daniel Schneider MRN: [id number] Date of Birth: [birth year] Referring Provider: Dr Clearance Coots    Encounter Date: 05/16/2017  PT End of Session - 05/16/17 1155    Visit Number  16    Number of Visits  21    Date for PT Re-Evaluation  05/17/17    PT Start Time  1102    PT Stop Time  1145    PT Time Calculation (min)  43 min    Activity Tolerance  Patient tolerated treatment well    Behavior During Therapy  [facility] for tasks assessed/performed       Past Medical History:  Diagnosis Date  . Cryptogenic stroke ([place]) 05/2015   [name] 05/27/2015; left hand weaker since (05/26/2015)  . PFO (patent foramen ovale)    a. 05/27/2015: s/p PFO closure w/ 44mm PFO occluder   . Sickle cell trait [facility])     Past Surgical History:  Procedure Laterality Date  . ASD AND VSD REPAIR  05/27/2015   [name] 05/27/2015  . CARDIAC CATHETERIZATION N/A 05/27/2015   Procedure: ASD/VSD Closure;  Surgeon: Sherren Mocha, MD;  Location: [place] [facility];  Service: Cardiovascular;  Laterality: N/A;  . TEE WITHOUT CARDIOVERSION N/A 04/21/2015   Procedure: TRANSESOPHAGEAL ECHOCARDIOGRAM (TEE);  Surgeon: Sueanne Margarita, MD;  Location: [facility] ENDOSCOPY;  Service: Cardiovascular;  Laterality: N/A;    There were no vitals filed for this visit.  Subjective Assessment - 05/16/17 1106    Subjective  I woke with neck pain .  sleeping on stomack trying to stretch arm.  Saw MD last week.  He said to work on getting my arm up  and  I did not need to use the sling.  i have been working on motion at home.   No pain with dressing but he modifies technique    Currently in Pain?  Yes    Pain Score  2     Pain Location  Shoulder    Pain Orientation  Left    Pain Descriptors / Indicators  Aching;Cramping    Pain Type  Acute  pain;Surgical pain    Pain Frequency  Intermittent    Aggravating Factors   sleeping on shoulder,  end range stretxching     Pain Relieving Factors  Shaking it out    Multiple Pain Sites  -- Neck HA left,  soreness on right neck 2/10   Started 3 days ago.  better with laying down.                        [place] Adult PT Treatment/Exercise - 05/16/17 0001      Shoulder Exercises: Prone   Retraction  20 reps cued AA initially    Extension  AROM    Extension Limitations  2 steps,  retraction and extension    Other Prone Exercises  row 20  X   AROM      Shoulder Exercises: Standing   External Rotation  15 reps    Theraband Level (Shoulder External Rotation)  Level 3 (Green)    Internal Rotation  20 reps    Theraband Level (Shoulder Internal Rotation)  Level 3 (Green)    Extension  20 reps    Theraband Level (Shoulder Extension)  Level 3 (Green)    Row  20  reps;Theraband    Theraband Level (Shoulder Row)  Level 3 (Green)    Other Standing Exercises  ball on wall press, clockwise and counter clockwise, horizontal add/ ABD,  flexion 3 seconds  114 abduction,  shakey end range   4/10    Other Standing Exercises  10 x 10 seconds door jam slide flexion both, ROM equal with increaed reps abduction single,      Shoulder Exercises: Pulleys   Flexion  2 minutes    ABduction  2 minutes      Shoulder Exercises: ROM/Strengthening   UBE (Upper Arm Bike)  L1 5 mintes forward               PT Short Term Goals - 05/16/17 1204      PT SHORT TERM GOAL #1   Title  Patient will increase shoulder flexion by 30 degrees     Time  4    Period  Weeks    Status  Achieved      PT SHORT TERM GOAL #2   Title  Patient will increase his left shoulder ER by 25 degrees     Time  4    Period  Weeks    Status  Achieved      PT SHORT TERM GOAL #3   Title  Patient will increase gross left shoulder strength to 3+/5     Baseline  not tested     Time  4    Period  Weeks    Status   On-going      PT SHORT TERM GOAL #4   Title  Patients will independent with HEP     Time  4    Period  Weeks    Status  Achieved        PT Long Term Goals - 03/22/17 2143      PT LONG TERM GOAL #1   Title  Patient will reach behind his back to L3 without pain     Time  8    Period  Weeks    Status  New    Target Date  05/17/17      PT LONG TERM GOAL #2   Title  Patient will reach behind his head without pain in order to perfrom hygien tasks     Time  8    Target Date  04/19/17      PT LONG TERM GOAL #3   Title  Patient will reach overhead to a shelf with a 2lb weight without pain     Time  8    Period  Weeks    Status  New    Target Date  04/19/17            Plan - 05/16/17 1156    Clinical Impression Statement  ROM equal with flexion wall slides after 10 reps.  Abducrion limited to 114 with wall slide.  AROM continues to be a challange.  Patient tolerated IR/ER band exercises with green band without increased pain.  Patient noted throbbong in shoulder at end of session however he declined the need for modalities.    PT Next Visit Plan  Follow protocol (Surgery Feb. 5th)  POC through 05/17/2017  Will need ERO next visit. Try to maximize ROM with decreased comp movements.    PT Home Exercise Plan  pendulum exer , scapula retraction,  isometrics sub max,  bands    Consulted and Agree with Plan of Care  Patient  Patient will benefit from skilled therapeutic intervention in order to improve the following deficits and impairments:     Visit Diagnosis: Chronic left shoulder pain  Stiffness of left shoulder, not elsewhere classified  Muscle weakness (generalized)     Problem List Patient Active Problem List   Diagnosis Date Noted  . Anxiety 08/03/2016  . Cerebrovascular accident (CVA) due to embolism of right middle cerebral artery ([place]) 05/30/2015  . Prediabetes 05/27/2015  . Sickle cell trait ([place]) 05/26/2015  . PFO (patent foramen ovale)   . Acute  CVA (cerebrovascular accident) ([place]) 04/20/2015  . Stroke ([place])   . HLD (hyperlipidemia)     Satya Buttram PTA 05/16/2017, 12:09 PM  [facility] [address] [place], [name], [address] Phone: [phone]   Fax:  [phone]  Name: Daniel Schneider MRN: [id number] Date of Birth: 1976-02-02

## 2017-05-19 ENCOUNTER — Encounter: Payer: Self-pay | Admitting: Physical Therapy

## 2017-05-19 ENCOUNTER — Ambulatory Visit: Payer: BLUE CROSS/BLUE SHIELD | Admitting: Physical Therapy

## 2017-05-19 DIAGNOSIS — G8929 Other chronic pain: Secondary | ICD-10-CM

## 2017-05-19 DIAGNOSIS — M25512 Pain in left shoulder: Principal | ICD-10-CM

## 2017-05-19 DIAGNOSIS — M6281 Muscle weakness (generalized): Secondary | ICD-10-CM

## 2017-05-19 DIAGNOSIS — M25612 Stiffness of left shoulder, not elsewhere classified: Secondary | ICD-10-CM

## 2017-05-19 NOTE — Therapy (Signed)
[place], [name], [address] Phone: [phone]   Fax:  [phone]  Physical Therapy Treatment/ Recert   Patient Details  Name: Daniel Schneider MRN: [id number] Date of Birth: 11/04/1975 Referring Provider: Dr Clearance Coots    Encounter Date: 05/19/2017  PT End of Session - 05/19/17 0935    Visit Number  17    Number of Visits  21    Date for PT Re-Evaluation  05/17/17    Authorization Type  BCBS     PT Start Time  0931    PT Stop Time  1015    PT Time Calculation (min)  44 min    Activity Tolerance  Patient tolerated treatment well    Behavior During Therapy  [facility] for tasks assessed/performed       Past Medical History:  Diagnosis Date  . Cryptogenic stroke ([place]) 05/2015   [name] 05/27/2015; left hand weaker since (05/26/2015)  . PFO (patent foramen ovale)    a. 05/27/2015: s/p PFO closure w/ 23m PFO occluder   . Sickle cell trait ([facility]     Past Surgical History:  Procedure Laterality Date  . ASD AND VSD REPAIR  05/27/2015   /[name]4/18/2017  . CARDIAC CATHETERIZATION N/A 05/27/2015   Procedure: ASD/VSD Closure;  Surgeon: MSherren Mocha MD;  [facility];  Service: Cardiovascular;  Laterality: N/A;  . TEE WITHOUT CARDIOVERSION N/A 04/21/2015   Procedure: TRANSESOPHAGEAL ECHOCARDIOGRAM (TEE);  Surgeon: TSueanne Margarita MD;  [facility];  Service: Cardiovascular;  Laterality: N/A;    There were no vitals filed for this visit.  Subjective Assessment - 05/19/17 0935    Subjective  Patient was sore the last visit in his bixpes area. He is better today. He has not pain today. he just reports it is weak.          [facility] PT Assessment - 05/19/17 0001      ROM / Strength   AROM / PROM / Strength  AROM      AROM   AROM Assessment Site  Shoulder    Right/Left Shoulder  Left    Left Shoulder Flexion  110 Degrees    Left Shoulder Internal Rotation  -- Can reach to L1     Left Shoulder External Rotation  -- Can not yet reach the back of his head       PROM   Left Shoulder Flexion  -- Full passive flexion    Left Shoulder Internal Rotation  -- Full passive IR    Left Shoulder External Rotation  -- Full passive ER      Strength   Left Shoulder Flexion  3+/5    Left Shoulder Internal Rotation  4+/5    Left Shoulder External Rotation  3+/5                   [facility] Adult PT Treatment/Exercise - 05/19/17 0001      Shoulder Exercises: Standing   External Rotation  20 reps    Theraband Level (Shoulder External Rotation)  Level 2 (Red);Level 1 (Yellow)    Internal Rotation  20 reps    Theraband Level (Shoulder Internal Rotation)  Level 2 (Red)    Extension  20 reps    Theraband Level (Shoulder Extension)  Level 3 (Green)    Row  20 reps;Theraband    Theraband Level (Shoulder Row)  Level 3 (Green)      Shoulder Exercises: Pulleys  Flexion  2 minutes    ABduction  2 minutes               PT Short Term Goals - 05/19/17 1039      PT SHORT TERM GOAL #1   Title  Patient will increase shoulder flexion by 30 degrees     Baseline  full     Time  4    Period  Weeks    Status  Achieved      PT SHORT TERM GOAL #2   Title  Patient will increase his left passive shoulder ER by 25 degrees     Baseline  full passive ER     Time  4    Period  Weeks    Status  Achieved      PT SHORT TERM GOAL #3   Title  Patient will increase gross left shoulder strength to 3+/5     Baseline  3+/5 ER and flexion     Time  4    Period  Weeks    Status  Achieved      PT SHORT TERM GOAL #4   Title  Patients will independent with HEP     Baseline  performing exercises at home     Time  4    Period  Weeks    Status  Achieved        PT Long Term Goals - 03/22/17 2143      PT LONG TERM GOAL #1   Title  Patient will reach behind his back to L3 without pain     Time  8    Period  Weeks    Status  New    Target Date  05/17/17      PT LONG  TERM GOAL #2   Title  Patient will reach behind his head without pain in order to perfrom hygien tasks     Time  8    Target Date  04/19/17      PT LONG TERM GOAL #3   Title  Patient will reach overhead to a shelf with a 2lb weight without pain     Time  8    Period  Weeks    Status  New    Target Date  04/19/17            Plan - 05/19/17 1027    Clinical Impression Statement  Therapy perfromed a re-assessment on the patient today. The patients strength is not progressing as expected. Hopefully it is because of non-complaince with his exercises. He continues to require moderate cuing for techique with basic ther-ex. He reports he is doing his exercises at home but it is questionable. There is also the potential that his non-complaince in the begining may be an issue right now. He was told every visit in the beggining to leave his arm in the sling and not use it for work tasks. He continues to have significant scapular weakness so hopefully that is the issue with his weakness. Therapy reviewed the exercises we want him to work on specifically. He is also doing other things that may not be hurting his shoulder but are not helping. He was advised again to stick withour program. The patient sees the MD in 3 weeks. We will continue to work on scapualr and functional strnegthening untilthat point. He has met his PROM goals for therapy.      Clinical Presentation  Stable    Clinical Decision Making  Low  Rehab Potential  Excellent    PT Frequency  2x / week    PT Duration  8 weeks    PT Treatment/Interventions  ADLs/Self Care Home Management;Cryotherapy;Electrical Stimulation;Therapeutic activities;Therapeutic exercise;Neuromuscular re-education;Patient/family education;Manual techniques;Taping;Passive range of motion    PT Next Visit Plan  Continue to advise the patient to follow HEP and stop trying to force his shoulder into flexion.    PT Home Exercise Plan  pendulum exer , scapula  retraction,  isometrics sub max,  bands    Recommended Other Services  Patient to call MD office and ask when follow up appointment is     Consulted and Agree with Plan of Care  Patient       Patient will benefit from skilled therapeutic intervention in order to improve the following deficits and impairments:  Pain, Impaired UE functional use, Decreased activity tolerance, Decreased endurance, Decreased range of motion, Decreased strength  Visit Diagnosis: Chronic left shoulder pain  Stiffness of left shoulder, not elsewhere classified  Muscle weakness (generalized)     Problem List Patient Active Problem List   Diagnosis Date Noted  . Anxiety 08/03/2016  . Cerebrovascular accident (CVA) due to embolism of right middle cerebral artery ([place]) 05/30/2015  . Prediabetes 05/27/2015  . Sickle cell trait ([place]) 05/26/2015  . PFO (patent foramen ovale)   . Acute CVA (cerebrovascular accident) ([place]) 04/20/2015  . Stroke ([place])   . HLD (hyperlipidemia)     Carney Living PT DPT  05/19/2017, 10:41 AM  [facility] [address] [place], [name], [address] Phone: [phone]   Fax:  [phone]  Name: JAHZEEL POYTHRESS MRN: [id number] Date of Birth: 1975-11-11

## 2017-05-23 ENCOUNTER — Encounter: Payer: Self-pay | Admitting: Physical Therapy

## 2017-05-23 ENCOUNTER — Ambulatory Visit: Payer: BLUE CROSS/BLUE SHIELD | Admitting: Physical Therapy

## 2017-05-23 DIAGNOSIS — G8929 Other chronic pain: Secondary | ICD-10-CM

## 2017-05-23 DIAGNOSIS — M25512 Pain in left shoulder: Principal | ICD-10-CM

## 2017-05-23 DIAGNOSIS — M25612 Stiffness of left shoulder, not elsewhere classified: Secondary | ICD-10-CM

## 2017-05-23 DIAGNOSIS — M6281 Muscle weakness (generalized): Secondary | ICD-10-CM

## 2017-05-23 NOTE — Therapy (Signed)
[place], [name], [address] Phone: [phone]   Fax:  [phone]  Physical Therapy Treatment  Patient Details  Name: Daniel Schneider MRN: [id number] Date of Birth: [birth year]  Referring Provider: Dr Clearance Coots    Encounter Date: 05/23/2017  PT End of Session - 05/23/17 0956    Visit Number  18    Number of Visits  21    Date for PT Re-Evaluation  06/16/17    Authorization Type  BCBS     PT Start Time  0928    PT Stop Time  1012    PT Time Calculation (min)  44 min    Activity Tolerance  Patient tolerated treatment well    Behavior During Therapy  [facility] for tasks assessed/performed       Past Medical History:  Diagnosis Date  . Cryptogenic stroke ([place]) 05/2015   [name] 05/27/2015; left hand weaker since (05/26/2015)  . PFO (patent foramen ovale)    a. 05/27/2015: s/p PFO closure w/ 1mm PFO occluder   . Sickle cell trait [facility])     Past Surgical History:  Procedure Laterality Date  . ASD AND VSD REPAIR  05/27/2015   [name] 05/27/2015  . CARDIAC CATHETERIZATION N/A 05/27/2015   Procedure: ASD/VSD Closure;  Surgeon: Sherren Mocha, MD;  Location: [place] [facility];  Service: Cardiovascular;  Laterality: N/A;  . TEE WITHOUT CARDIOVERSION N/A 04/21/2015   Procedure: TRANSESOPHAGEAL ECHOCARDIOGRAM (TEE);  Surgeon: Sueanne Margarita, MD;  Location: [facility] ENDOSCOPY;  Service: Cardiovascular;  Laterality: N/A;    There were no vitals filed for this visit.  Subjective Assessment - 05/23/17 0931    Subjective  Patient continues to report soreness. Therapy had advised him to do his HEP over the weekend but he reports he has been working on reaching. Therapy advised him last week he will just make himself sore if he continues to try to reach up without habing the strength. He also reports pain in hte middle of his chest . He feels like it is a pulled muscle.     Limitations  House hold activities;Lifting    Currently in Pain?  Yes    Pain Score  3     Pain Location  Shoulder    Pain Orientation  Left    Pain Descriptors / Indicators  Aching    Pain Type  Acute pain;Surgical pain    Pain Onset  More than a month ago    Pain Frequency  Intermittent    Aggravating Factors   sleeping on the shoulder     Pain Relieving Factors  stretching     Effect of Pain on Daily Activities  difficulty using his shoulder                        [facility] Adult PT Treatment/Exercise - 05/23/17 0001      Shoulder Exercises: Prone   Retraction  20 reps;Limitations cued AA initially    Retraction Limitations  2lb     Extension Limitations  2 steps,  retraction and extension 2lb       Shoulder Exercises: Standing   External Rotation  20 reps;Limitations    Theraband Level (Shoulder External Rotation)  Level 1 (Yellow)    External Rotation Limitations  Patient continues to require moderate cuing for techniuqe.     Internal Rotation  20 reps    Theraband Level (Shoulder Internal Rotation)  Level 2 (Red)  Extension  20 reps    Theraband Level (Shoulder Extension)  Level 3 (Green)    Row  20 reps;Theraband    Theraband Level (Shoulder Row)  Level 3 (Green)      Shoulder Exercises: ROM/Strengthening   UBE (Upper Arm Bike)  L1 2 forward 2 back      Modalities   Modalities  Ultrasound      Ultrasound   Ultrasound Location  left anterior and lateral shoulder     Ultrasound Parameters  1.5w/cm2 cont     Ultrasound Goals  Pain      Manual Therapy   Manual Therapy  Joint mobilization    Joint Mobilization  grade I and II mobilizations AP and inferior glides to reduce pain in the shoulder     Soft tissue mobilization  to upper trap to reduce soreness             PT Education - 05/23/17 1322    Education provided  Yes    Education Details  improtance of following     Person(s) Educated  Patient    Methods  Demonstration;Explanation;Tactile cues;Verbal cues    Comprehension   Verbalized understanding;Returned demonstration;Verbal cues required;Tactile cues required       PT Short Term Goals - 05/19/17 1039      PT SHORT TERM GOAL #1   Title  Patient will increase shoulder flexion by 30 degrees     Baseline  full     Time  4    Period  Weeks    Status  Achieved      PT SHORT TERM GOAL #2   Title  Patient will increase his left passive shoulder ER by 25 degrees     Baseline  full passive ER     Time  4    Period  Weeks    Status  Achieved      PT SHORT TERM GOAL #3   Title  Patient will increase gross left shoulder strength to 3+/5     Baseline  3+/5 ER and flexion     Time  4    Period  Weeks    Status  Achieved      PT SHORT TERM GOAL #4   Title  Patients will independent with HEP     Baseline  performing exercises at home     Time  4    Period  Weeks    Status  Achieved        PT Long Term Goals - 03/22/17 2143      PT LONG TERM GOAL #1   Title  Patient will reach behind his back to L3 without pain     Time  8    Period  Weeks    Status  New    Target Date  05/17/17      PT LONG TERM GOAL #2   Title  Patient will reach behind his head without pain in order to perfrom hygien tasks     Time  8    Target Date  04/19/17      PT LONG TERM GOAL #3   Title  Patient will reach overhead to a shelf with a 2lb weight without pain     Time  8    Period  Weeks    Status  New    Target Date  04/19/17            Plan - 05/23/17 0949    Clinical  Impression Statement  Therapy again advised the patient to follw the exercises given to him. He aprears to have developed some bicpes teninitis. Therapy worked on ultrasound again to reduce iflammation.  He was advised if he continues to try to force his shoulder up He will likely continues to have pain. n    Clinical Presentation  Stable    Clinical Decision Making  Low    PT Frequency  2x / week    PT Duration  8 weeks    PT Treatment/Interventions  ADLs/Self Care Home  Management;Cryotherapy;Electrical Stimulation;Therapeutic activities;Therapeutic exercise;Neuromuscular re-education;Patient/family education;Manual techniques;Taping;Passive range of motion;Ultrasound    PT Next Visit Plan  Continue to advise the patient to follow HEP and stop trying to force his shoulder into flexion.    PT Home Exercise Plan  pendulum exer , scapula retraction,  isometrics sub max,  bands    Consulted and Agree with Plan of Care  Patient       Patient will benefit from skilled therapeutic intervention in order to improve the following deficits and impairments:  Pain, Impaired UE functional use, Decreased activity tolerance, Decreased endurance, Decreased range of motion, Decreased strength  Visit Diagnosis: Chronic left shoulder pain  Stiffness of left shoulder, not elsewhere classified  Muscle weakness (generalized)     Problem List Patient Active Problem List   Diagnosis Date Noted  . Anxiety 08/03/2016  . Cerebrovascular accident (CVA) due to embolism of right middle cerebral artery ([place]) 05/30/2015  . Prediabetes 05/27/2015  . Sickle cell trait ([place]) 05/26/2015  . PFO (patent foramen ovale)   . Acute CVA (cerebrovascular accident) ([place]) 04/20/2015  . Stroke ([place])   . HLD (hyperlipidemia)     Carney Living PT DPT  05/23/2017, 4:29 PM  [facility] [address] [place], [name], [address] Phone: [phone]   Fax:  [phone]  Name: Daniel Schneider MRN: [id number] Date of Birth: [birth year]

## 2017-05-25 ENCOUNTER — Encounter: Payer: Self-pay | Admitting: Physical Therapy

## 2017-05-25 ENCOUNTER — Ambulatory Visit: Payer: BLUE CROSS/BLUE SHIELD | Admitting: Physical Therapy

## 2017-05-25 DIAGNOSIS — M25512 Pain in left shoulder: Principal | ICD-10-CM

## 2017-05-25 DIAGNOSIS — G8929 Other chronic pain: Secondary | ICD-10-CM

## 2017-05-25 DIAGNOSIS — M6281 Muscle weakness (generalized): Secondary | ICD-10-CM

## 2017-05-25 DIAGNOSIS — M25612 Stiffness of left shoulder, not elsewhere classified: Secondary | ICD-10-CM

## 2017-05-25 NOTE — Therapy (Signed)
[place], [name], [address] Phone: [phone]   Fax:  [phone]  Physical Therapy Treatment  Patient Details  Name: Daniel Schneider MRN: [id number] Date of Birth: 1975/06/24 Referring Provider: Dr Clearance Coots    Encounter Date: 05/25/2017  PT End of Session - 05/25/17 1231    Visit Number  19    Number of Visits  21    Date for PT Re-Evaluation  06/16/17    PT Start Time  1025    PT Stop Time  1120    PT Time Calculation (min)  55 min    Activity Tolerance  Patient tolerated treatment well    Behavior During Therapy  [facility] for tasks assessed/performed       Past Medical History:  Diagnosis Date  . Cryptogenic stroke ([place]) 05/2015   [name] 05/27/2015; left hand weaker since (05/26/2015)  . PFO (patent foramen ovale)    a. 05/27/2015: s/p PFO closure w/ 19mm PFO occluder   . Sickle cell trait [facility])     Past Surgical History:  Procedure Laterality Date  . ASD AND VSD REPAIR  05/27/2015   [name] 05/27/2015  . CARDIAC CATHETERIZATION N/A 05/27/2015   Procedure: ASD/VSD Closure;  Surgeon: Sherren Mocha, MD;  Location: [place] [facility];  Service: Cardiovascular;  Laterality: N/A;  . TEE WITHOUT CARDIOVERSION N/A 04/21/2015   Procedure: TRANSESOPHAGEAL ECHOCARDIOGRAM (TEE);  Surgeon: Sueanne Margarita, MD;  Location: [facility] ENDOSCOPY;  Service: Cardiovascular;  Laterality: N/A;    There were no vitals filed for this visit.  Subjective Assessment - 05/25/17 1028    Subjective  Slept better with pillows as he did post surgery.  Mild at rest with movements 2/10. Limits his movements to limit pain.     Pain Score  2     Pain Location  Shoulder    Pain Orientation  Left    Pain Descriptors / Indicators  Aching    Pain Frequency  Intermittent    Aggravating Factors   sleeping on shoulder    Pain Relieving Factors  Sleeping with pillows,  [place]                       [facility] Adult PT  Treatment/Exercise - 05/25/17 0001      Self-Care   Other Self-Care Comments   discussed the use of TENS.    how it works ,  where to get electrodes.  do's don'ts.  Patient has a neighbor who has offered the use of a home TENS.  I did not suggest or discourage patient from using neighbor's unit      Shoulder Exercises: Sidelying   External Rotation  20 reps    External Rotation Weight (lbs)  1 with rolled towel      Shoulder Exercises: Standing   External Rotation  20 reps    Internal Rotation  20 reps    Theraband Level (Shoulder Internal Rotation)  Level 2 (Red)    Extension  20 reps    Theraband Level (Shoulder Extension)  Level 3 (Green)    Row  20 reps    Theraband Level (Shoulder Row)  Level 3 (Green)      Modalities   Modalities  Cryotherapy;Moist Heat;Ultrasound      Moist Heat Therapy   Number Minutes Moist Heat  10 Minutes    Moist Heat Location  Cervical concurrent with shoulder cold pack      Cryotherapy  Number Minutes Cryotherapy  10 Minutes    Cryotherapy Location  Shoulder    Type of Cryotherapy  -- cold pack,  concurrent with moist heat for neck      Ultrasound   Ultrasound Location  Lt anteripor shoulder    Ultrasound Parameters  1.5 watts/cm2,  100%    Ultrasound Goals  Pain      Manual Therapy   Soft tissue mobilization  instrument assist to upper traps and levator,  tissue softened.             PT Education - 05/25/17 1230    Education provided  Yes    Education Details  Following guidelines inportance.   TENS info    Person(s) Educated  Patient    Methods  Explanation    Comprehension  Verbalized understanding       PT Short Term Goals - 05/19/17 1039      PT SHORT TERM GOAL #1   Title  Patient will increase shoulder flexion by 30 degrees     Baseline  full     Time  4    Period  Weeks    Status  Achieved      PT SHORT TERM GOAL #2   Title  Patient will increase his left passive shoulder ER by 25 degrees     Baseline  full  passive ER     Time  4    Period  Weeks    Status  Achieved      PT SHORT TERM GOAL #3   Title  Patient will increase gross left shoulder strength to 3+/5     Baseline  3+/5 ER and flexion     Time  4    Period  Weeks    Status  Achieved      PT SHORT TERM GOAL #4   Title  Patients will independent with HEP     Baseline  performing exercises at home     Time  4    Period  Weeks    Status  Achieved        PT Long Term Goals - 05/25/17 1244      PT LONG TERM GOAL #1   Title  Patient will reach behind his back to L3 without pain     Time  8    Period  Weeks    Status  Unable to assess      PT LONG TERM GOAL #2   Title  Patient will reach behind his head without pain in order to perfrom hygien tasks     Baseline  needs to move head forward or uses other hand.  Avoids this position for now.    Time  8    Period  Weeks      PT LONG TERM GOAL #3   Title  Patient will reach overhead to a shelf with a 2lb weight without pain     Time  8    Period  Weeks    Status  Unable to assess            Plan - 05/25/17 1241    Clinical Impression Statement  Continued lower level exercises and reinforced following strictly so it will calm down  No pain at rest post session.  No new  findings except patient was able to sleep better.    PT Next Visit Plan  Continue to advise the patient to follow HEP and stop trying to force his  shoulder into flexion.   Continue [place] since it was helpful.    PT Home Exercise Plan  pendulum exer , scapula retraction,  isometrics sub max,  bands    Consulted and Agree with Plan of Care  Patient       Patient will benefit from skilled therapeutic intervention in order to improve the following deficits and impairments:     Visit Diagnosis: Chronic left shoulder pain  Stiffness of left shoulder, not elsewhere classified  Muscle weakness (generalized)     Problem List Patient Active Problem List   Diagnosis Date Noted  . Anxiety 08/03/2016  .  Cerebrovascular accident (CVA) due to embolism of right middle cerebral artery ([place]) 05/30/2015  . Prediabetes 05/27/2015  . Sickle cell trait ([place]) 05/26/2015  . PFO (patent foramen ovale)   . Acute CVA (cerebrovascular accident) ([place]) 04/20/2015  . Stroke ([place])   . HLD (hyperlipidemia)     Daniel Schneider PTA 05/25/2017, 12:48 PM  [facility] [address] [place], [name], [address] Phone: [phone]   Fax:  [phone]  Name: Daniel Schneider MRN: [id number] Date of Birth: January 20, 1976

## 2017-05-30 ENCOUNTER — Encounter: Payer: Self-pay | Admitting: Physical Therapy

## 2017-05-30 ENCOUNTER — Ambulatory Visit: Payer: BLUE CROSS/BLUE SHIELD | Admitting: Physical Therapy

## 2017-05-30 DIAGNOSIS — M25612 Stiffness of left shoulder, not elsewhere classified: Secondary | ICD-10-CM

## 2017-05-30 DIAGNOSIS — M6281 Muscle weakness (generalized): Secondary | ICD-10-CM

## 2017-05-30 DIAGNOSIS — M25512 Pain in left shoulder: Principal | ICD-10-CM

## 2017-05-30 DIAGNOSIS — G8929 Other chronic pain: Secondary | ICD-10-CM

## 2017-05-30 NOTE — Therapy (Addendum)
[place], [name], [address] Phone: [phone]   Fax:  [phone]  Physical Therapy Treatment  Patient Details  Name: Daniel Schneider MRN: [id number] Date of Birth: 1975/06/08 Referring Provider: Dr Clearance Coots    Encounter Date: 05/30/2017  PT End of Session - 05/30/17 0908    Visit Number  20    Number of Visits  21    Date for PT Re-Evaluation  06/16/17    Authorization Type  BCBS     PT Start Time  0904    PT Stop Time  0947    PT Time Calculation (min)  43 min    Activity Tolerance  Patient tolerated treatment well    Behavior During Therapy  [facility] for tasks assessed/performed       Past Medical History:  Diagnosis Date  . Cryptogenic stroke ([place]) 05/2015   [name] 05/27/2015; left hand weaker since (05/26/2015)  . PFO (patent foramen ovale)    a. 05/27/2015: s/p PFO closure w/ 67mm PFO occluder   . Sickle cell trait [facility])     Past Surgical History:  Procedure Laterality Date  . ASD AND VSD REPAIR  05/27/2015   [name] 05/27/2015  . CARDIAC CATHETERIZATION N/A 05/27/2015   Procedure: ASD/VSD Closure;  Surgeon: Sherren Mocha, MD;  Location: [place] [facility];  Service: Cardiovascular;  Laterality: N/A;  . TEE WITHOUT CARDIOVERSION N/A 04/21/2015   Procedure: TRANSESOPHAGEAL ECHOCARDIOGRAM (TEE);  Surgeon: Sueanne Margarita, MD;  Location: [facility] ENDOSCOPY;  Service: Cardiovascular;  Laterality: N/A;    There were no vitals filed for this visit.  Subjective Assessment - 05/30/17 0906    Subjective  Patient reports that he is not having any shoulder pain today. Patient states that he continues to sleep with pillows under his arm and that he has seen improvement.     Limitations  House hold activities;Lifting    Patient Stated Goals  Riding his bike     Currently in Pain?  No/denies                       [facility] Adult PT Treatment/Exercise - 05/30/17 0001      Shoulder Exercises:  Prone   Retraction  20 reps;Limitations cued AA initially    Retraction Limitations  1lb     Extension Limitations  2 steps,  retraction and extension 1lb       Shoulder Exercises: Sidelying   External Rotation  20 reps    External Rotation Weight (lbs)  1 with rolled towel      Shoulder Exercises: Standing   External Rotation  20 reps    Theraband Level (Shoulder External Rotation)  Level 2 (Red);Level 1 (Yellow)    Internal Rotation  20 reps    Theraband Level (Shoulder Internal Rotation)  Level 2 (Red)    Extension  20 reps    Theraband Level (Shoulder Extension)  Level 3 (Green)    Row  20 reps    Theraband Level (Shoulder Row)  Level 3 (Green)    Other Standing Exercises  wall ladder, ball on wall, door jam slides     Other Standing Exercises  UE ranger over head      Shoulder Exercises: Pulleys   Flexion  2 minutes      Shoulder Exercises: ROM/Strengthening   UBE (Upper Arm Bike)  L1 2 forward 2 back  PT Education - 05/30/17 0955    Education provided  Yes    Education Details  Exercise technique     Person(s) Educated  Patient    Methods  Explanation;Demonstration;Tactile cues;Verbal cues    Comprehension  Verbalized understanding;Returned demonstration;Need further instruction       PT Short Term Goals - 05/19/17 1039      PT SHORT TERM GOAL #1   Title  Patient will increase shoulder flexion by 30 degrees     Baseline  full     Time  4    Period  Weeks    Status  Achieved      PT SHORT TERM GOAL #2   Title  Patient will increase his left passive shoulder ER by 25 degrees     Baseline  full passive ER     Time  4    Period  Weeks    Status  Achieved      PT SHORT TERM GOAL #3   Title  Patient will increase gross left shoulder strength to 3+/5     Baseline  3+/5 ER and flexion     Time  4    Period  Weeks    Status  Achieved      PT SHORT TERM GOAL #4   Title  Patients will independent with HEP     Baseline  performing exercises at  home     Time  4    Period  Weeks    Status  Achieved        PT Long Term Goals - 05/25/17 1244      PT LONG TERM GOAL #1   Title  Patient will reach behind his back to L3 without pain     Time  8    Period  Weeks    Status  Unable to assess      PT LONG TERM GOAL #2   Title  Patient will reach behind his head without pain in order to perfrom hygien tasks     Baseline  needs to move head forward or uses other hand.  Avoids this position for now.    Time  8    Period  Weeks      PT LONG TERM GOAL #3   Title  Patient will reach overhead to a shelf with a 2lb weight without pain     Time  8    Period  Weeks    Status  Unable to assess            Plan - 05/30/17 0959    Clinical Impression Statement  Therapy continued strengthening exercises within patient tolerated limits. Patient reported no pain while perfroming exercises. Therapy will continue to slowly strenghten shoulder.     Clinical Presentation  Stable    Clinical Decision Making  Low    Rehab Potential  Excellent    PT Frequency  2x / week    PT Duration  8 weeks    PT Treatment/Interventions  ADLs/Self Care Home Management;Cryotherapy;Electrical Stimulation;Therapeutic activities;Therapeutic exercise;Neuromuscular re-education;Patient/family education;Manual techniques;Taping;Passive range of motion;Ultrasound    PT Next Visit Plan  Continue to advise the patient to follow HEP and stop trying to force his shoulder into flexion.   Continue [place] since it was helpful.    PT Home Exercise Plan  pendulum exer , scapula retraction,  isometrics sub max,  bands    Consulted and Agree with Plan of Care  Patient  Patient will benefit from skilled therapeutic intervention in order to improve the following deficits and impairments:  Pain, Impaired UE functional use, Decreased activity tolerance, Decreased endurance, Decreased range of motion, Decreased strength  Visit Diagnosis: Chronic left shoulder  pain  Stiffness of left shoulder, not elsewhere classified  Muscle weakness (generalized)     Problem List Patient Active Problem List   Diagnosis Date Noted  . Anxiety 08/03/2016  . Cerebrovascular accident (CVA) due to embolism of right middle cerebral artery ([place]) 05/30/2015  . Prediabetes 05/27/2015  . Sickle cell trait ([place]) 05/26/2015  . PFO (patent foramen ovale)   . Acute CVA (cerebrovascular accident) ([place]) 04/20/2015  . Stroke ([place])   . HLD (hyperlipidemia)    Carolyne Littles PT DPT  05/30/2017 Cooper Render 05/30/2017, 10:00 AM  Denyse Dago PTA 05/30/2017  During this treatment session, the therapist was present, participating in and directing the treatment.   [place], [name], [address] Phone: [phone]   Fax:  [phone]  Name: Daniel Schneider MRN: [id number] Date of Birth: Mar 09, 1975

## 2017-06-01 ENCOUNTER — Encounter: Payer: Self-pay | Admitting: Physical Therapy

## 2017-06-01 ENCOUNTER — Ambulatory Visit: Payer: BLUE CROSS/BLUE SHIELD | Admitting: Physical Therapy

## 2017-06-01 DIAGNOSIS — M25512 Pain in left shoulder: Principal | ICD-10-CM

## 2017-06-01 DIAGNOSIS — M25612 Stiffness of left shoulder, not elsewhere classified: Secondary | ICD-10-CM

## 2017-06-01 DIAGNOSIS — M6281 Muscle weakness (generalized): Secondary | ICD-10-CM

## 2017-06-01 DIAGNOSIS — G8929 Other chronic pain: Secondary | ICD-10-CM

## 2017-06-01 NOTE — Therapy (Addendum)
[place], [name], [address] Phone: [phone]   Fax:  [phone]  Physical Therapy Treatment  Patient Details  Name: Daniel Schneider MRN: [id number] Date of Birth: 09/25/1975 Referring Provider: Dr Clearance Coots    Encounter Date: 06/01/2017  PT End of Session - 06/01/17 0933    Visit Number  20    Number of Visits  28    Date for PT Re-Evaluation  06/29/17    Authorization Type  BCBS     PT Start Time  0930    PT Stop Time  1018    PT Time Calculation (min)  48 min    Activity Tolerance  Patient tolerated treatment well    Behavior During Therapy  [facility] for tasks assessed/performed       Past Medical History:  Diagnosis Date  . Cryptogenic stroke ([place]) 05/2015   [name] 05/27/2015; left hand weaker since (05/26/2015)  . PFO (patent foramen ovale)    a. 05/27/2015: s/p PFO closure w/ 22mm PFO occluder   . Sickle cell trait [facility])     Past Surgical History:  Procedure Laterality Date  . ASD AND VSD REPAIR  05/27/2015   [name] 05/27/2015  . CARDIAC CATHETERIZATION N/A 05/27/2015   Procedure: ASD/VSD Closure;  Surgeon: Sherren Mocha, MD;  Location: [place] [facility];  Service: Cardiovascular;  Laterality: N/A;  . TEE WITHOUT CARDIOVERSION N/A 04/21/2015   Procedure: TRANSESOPHAGEAL ECHOCARDIOGRAM (TEE);  Surgeon: Sueanne Margarita, MD;  Location: [facility] ENDOSCOPY;  Service: Cardiovascular;  Laterality: N/A;    There were no vitals filed for this visit.  Subjective Assessment - 06/01/17 0930    Subjective  Patient reports that his shoulder felt fine after his therpay visit but he is having pain today and thinks he slept on it wrong. Patient states that when he woke up his shoulder felt achy.     Limitations  House hold activities;Lifting    Patient Stated Goals  Riding his bike     Pain Score  2     Pain Location  Shoulder    Pain Orientation  Left    Pain Descriptors / Indicators  Aching    Pain Type   Acute pain;Surgical pain    Pain Onset  More than a month ago    Pain Frequency  Intermittent    Aggravating Factors   sleeping on shoulder     Pain Relieving Factors  sleeping with pillows, [place]    Effect of Pain on Daily Activities  difficulty using his shoulder                        [facility] Adult PT Treatment/Exercise - 06/01/17 0001      Shoulder Exercises: Prone   Retraction  20 reps;Limitations cued AA initially    Retraction Limitations  --    Extension  20 reps    Extension Limitations  --      Shoulder Exercises: Sidelying   External Rotation  20 reps    External Rotation Weight (lbs)  -- with rolled towel      Shoulder Exercises: Standing   External Rotation  20 reps    Theraband Level (Shoulder External Rotation)  Level 1 (Yellow)    Internal Rotation  --    Theraband Level (Shoulder Internal Rotation)  --    Extension  20 reps    Theraband Level (Shoulder Extension)  Level 2 (Red)  Row  20 reps    Theraband Level (Shoulder Row)  Level 2 (Red)      Shoulder Exercises: ROM/Strengthening   UBE (Upper Arm Bike)  L1 2 forward 2 back      Ultrasound   Ultrasound Location  Left anterior shoulder     Ultrasound Parameters  1.5 watts/cm2; 100%    Ultrasound Goals  Pain      Iontophoresis   Type of Iontophoresis  Dexamethasone    Location  L Anterior Shoulder     Dose  1cc    Time  slow release (4-6 hours)      Manual Therapy   Manual therapy comments  PROM ER/IR/Flex    Joint Mobilization  grade I and II mobilizations AP and inferior glides to reduce pain in the shoulder              PT Education - 06/01/17 1319    Education provided  Yes    Education Details  Reviewed the benefit of [place]    Person(s) Educated  Patient    Methods  Explanation;Demonstration;Tactile cues    Comprehension  Verbalized understanding       PT Short Term Goals - 05/19/17 1039      PT SHORT TERM GOAL #1   Title  Patient will increase shoulder flexion by 30  degrees     Baseline  full     Time  4    Period  Weeks    Status  Achieved      PT SHORT TERM GOAL #2   Title  Patient will increase his left passive shoulder ER by 25 degrees     Baseline  full passive ER     Time  4    Period  Weeks    Status  Achieved      PT SHORT TERM GOAL #3   Title  Patient will increase gross left shoulder strength to 3+/5     Baseline  3+/5 ER and flexion     Time  4    Period  Weeks    Status  Achieved      PT SHORT TERM GOAL #4   Title  Patients will independent with HEP     Baseline  performing exercises at home     Time  4    Period  Weeks    Status  Achieved        PT Long Term Goals - 05/25/17 1244      PT LONG TERM GOAL #1   Title  Patient will reach behind his back to L3 without pain     Time  8    Period  Weeks    Status  Unable to assess      PT LONG TERM GOAL #2   Title  Patient will reach behind his head without pain in order to perfrom hygien tasks     Baseline  needs to move head forward or uses other hand.  Avoids this position for now.    Time  8    Period  Weeks      PT LONG TERM GOAL #3   Title  Patient will reach overhead to a shelf with a 2lb weight without pain     Time  8    Period  Weeks    Status  Unable to assess            Plan - 06/01/17 0940    Clinical Impression Statement  Patient reported an increase in soreness of his shoulder today. Therapy addressed pain with graded shoulder mobilizations/PROM, [place], and [place]. Therapy continued with light strengthening exercises. Patient tolerated treatment well.  Patient would benefit from further stregthening but is nearly independent with his HEP and may be able to progress to his HEP and keep working on his own.     Clinical Presentation  Stable    Clinical Decision Making  Low    Rehab Potential  Excellent    PT Frequency  2x / week    PT Duration  8 weeks    PT Treatment/Interventions  ADLs/Self Care Home Management;Cryotherapy;Electrical  Stimulation;Therapeutic activities;Therapeutic exercise;Neuromuscular re-education;Patient/family education;Manual techniques;Taping;Passive range of motion;Ultrasound;Iontophoresis 4mg /ml Dexamethasone    PT Next Visit Plan  Continue to advise the patient to follow HEP and stop trying to force his shoulder into flexion.   Continue [place] since it was helpful.    PT Home Exercise Plan  pendulum exer , scapula retraction,  isometrics sub max,  bands    Consulted and Agree with Plan of Care  Patient       Patient will benefit from skilled therapeutic intervention in order to improve the following deficits and impairments:  Pain, Impaired UE functional use, Decreased activity tolerance, Decreased endurance, Decreased range of motion, Decreased strength  Visit Diagnosis: Chronic left shoulder pain  Stiffness of left shoulder, not elsewhere classified  Muscle weakness (generalized)     Problem List Patient Active Problem List   Diagnosis Date Noted  . Anxiety 08/03/2016  . Cerebrovascular accident (CVA) due to embolism of right middle cerebral artery ([place]) 05/30/2015  . Prediabetes 05/27/2015  . Sickle cell trait ([place]) 05/26/2015  . PFO (patent foramen ovale)   . Acute CVA (cerebrovascular accident) ([place]) 04/20/2015  . Stroke ([place])   . HLD (hyperlipidemia)     Carney Living 06/01/2017, 3:34 PM  [facility] [address] [place], [name], [address] Phone: [phone]   Fax:  [phone]  Name: SANTANA EDELL MRN: [id number] Date of Birth: 22-May-1975

## 2017-06-06 ENCOUNTER — Encounter: Payer: Self-pay | Admitting: Physical Therapy

## 2017-06-06 ENCOUNTER — Ambulatory Visit: Payer: BLUE CROSS/BLUE SHIELD | Admitting: Physical Therapy

## 2017-06-06 DIAGNOSIS — M25612 Stiffness of left shoulder, not elsewhere classified: Secondary | ICD-10-CM

## 2017-06-06 DIAGNOSIS — M25512 Pain in left shoulder: Principal | ICD-10-CM

## 2017-06-06 DIAGNOSIS — G8929 Other chronic pain: Secondary | ICD-10-CM

## 2017-06-06 DIAGNOSIS — M6281 Muscle weakness (generalized): Secondary | ICD-10-CM

## 2017-06-06 NOTE — Therapy (Signed)
[place] [employer], [name], [address] Phone: [phone]   Fax:  [phone]  Physical Therapy Treatment  Patient Details  Name: Daniel Schneider MRN: [id number] Date of Birth: 1975/05/15 Referring Provider: Dr Clearance Coots    Encounter Date: 06/06/2017  PT End of Session - 06/06/17 1015    Visit Number  21    Number of Visits  28    Date for PT Re-Evaluation  06/29/17    PT Start Time  0932    PT Stop Time  1014    PT Time Calculation (min)  42 min    Activity Tolerance  Patient tolerated treatment well    Behavior During Therapy  [facility] for tasks assessed/performed       Past Medical History:  Diagnosis Date  . Cryptogenic stroke ([place]) 05/2015   [name] 05/27/2015; left hand weaker since (05/26/2015)  . PFO (patent foramen ovale)    a. 05/27/2015: s/p PFO closure w/ 44mm PFO occluder   . Sickle cell trait [facility])     Past Surgical History:  Procedure Laterality Date  . ASD AND VSD REPAIR  05/27/2015   [name] 05/27/2015  . CARDIAC CATHETERIZATION N/A 05/27/2015   Procedure: ASD/VSD Closure;  Surgeon: Sherren Mocha, MD;  Location: [place] CV LAB;  Service: Cardiovascular;  Laterality: N/A;  . TEE WITHOUT CARDIOVERSION N/A 04/21/2015   Procedure: TRANSESOPHAGEAL ECHOCARDIOGRAM (TEE);  Surgeon: Sueanne Margarita, MD;  Location: [facility] ENDOSCOPY;  Service: Cardiovascular;  Laterality: N/A;    There were no vitals filed for this visit.  Subjective Assessment - 06/06/17 0938    Subjective  Pain up to 2/10 yesterday with stretching.  No pain right now.  he continues to stretch even though advised against.   MD sometime this week.  (Pended)     Currently in Pain?  Yes  (Pended)     Pain Score  --  (Pended)  up to 2/10    Pain Location  Shoulder  (Pended)     Pain Orientation  Left;Anterior;Posterior;Lower  (Pended)     Pain Descriptors / Indicators  Aching  (Pended)     Pain Radiating Towards  shoots to neck,,   sometims both sides  (Pended)     Aggravating Factors   stretching  (Pended)                        [facility] Adult PT Treatment/Exercise - 06/06/17 0001      Shoulder Exercises: Standing   External Rotation  20 reps    Theraband Level (Shoulder External Rotation)  Level 2 (Red) PAINFREE      Internal Rotation  -- did not do  more than 3 x    Theraband Level (Shoulder Internal Rotation)  -- 3 x red band with roll,  click noted by patient, painfree,     Extension  20 reps    Theraband Level (Shoulder Extension)  Level 2 (Red)    Row  20 reps    Theraband Level (Shoulder Row)  Level 2 (Red)      Shoulder Exercises: ROM/Strengthening   UBE (Upper Arm Bike)  L1 2 forward 3 back NO PAIN      Ultrasound   Ultrasound Location  Left anterior shoulder    Ultrasound Parameters  1.5 watts/cm2,  8 minutes    Ultrasound Goals  Pain      Iontophoresis   Type of Iontophoresis  Dexamethasone  Location  L Anterior Shoulder     Dose  1cc    Time  slow release (4-6 hours)             PT Education - 06/06/17 1014    Education Details  conservative exercise benifits    Person(s) Educated  Patient    Methods  Explanation    Comprehension  Verbalized understanding       PT Short Term Goals - 05/19/17 1039      PT SHORT TERM GOAL #1   Title  Patient will increase shoulder flexion by 30 degrees     Baseline  full     Time  4    Period  Weeks    Status  Achieved      PT SHORT TERM GOAL #2   Title  Patient will increase his left passive shoulder ER by 25 degrees     Baseline  full passive ER     Time  4    Period  Weeks    Status  Achieved      PT SHORT TERM GOAL #3   Title  Patient will increase gross left shoulder strength to 3+/5     Baseline  3+/5 ER and flexion     Time  4    Period  Weeks    Status  Achieved      PT SHORT TERM GOAL #4   Title  Patients will independent with HEP     Baseline  performing exercises at home     Time  4    Period  Weeks     Status  Achieved        PT Long Term Goals - 06/06/17 1018      PT LONG TERM GOAL #1   Title  Patient will reach behind his back to L3 without pain     Baseline  able to reach  with soreness    Time  8    Period  Weeks    Status  On-going      PT LONG TERM GOAL #2   Title  Patient will reach behind his head without pain in order to perfrom hygien tasks     Baseline  needs to move head forward or uses other hand.  Avoids this position for now.    Time  8    Period  Weeks      PT LONG TERM GOAL #3   Title  Patient will reach overhead to a shelf with a 2lb weight without pain     Status  Unable to assess            Plan - 06/06/17 1015    Clinical Impression Statement  Pain intermittant.  he has continued to stretch against advice due to wanting to be redy to return to work.  No visable atrophy noted in deltoid left.  Light strengthening continued.  he was able to progress to red with out pain increase with ER.   He has a MD appointment sometime this week .    PT Next Visit Plan  Continue to advise the patient to follow HEP and stop trying to force his shoulder into flexion.   Continue [place] and ionto  since it was helpful.    PT Home Exercise Plan  pendulum exer , scapula retraction,  isometrics sub max,  bands       Patient will benefit from skilled therapeutic intervention in order to improve the following  deficits and impairments:     Visit Diagnosis: Chronic left shoulder pain  Stiffness of left shoulder, not elsewhere classified  Muscle weakness (generalized)     Problem List Patient Active Problem List   Diagnosis Date Noted  . Anxiety 08/03/2016  . Cerebrovascular accident (CVA) due to embolism of right middle cerebral artery ([place]) 05/30/2015  . Prediabetes 05/27/2015  . Sickle cell trait ([place]) 05/26/2015  . PFO (patent foramen ovale)   . Acute CVA (cerebrovascular accident) ([place]) 04/20/2015  . Stroke ([place])   . HLD (hyperlipidemia)     Julious Langlois  PTA 06/06/2017, 10:20 AM  [facility] [address] [place], [name], [address] Phone: [phone]   Fax:  [phone]  Name: Daniel Schneider MRN: [id number] Date of Birth: 1975-12-03

## 2017-06-08 ENCOUNTER — Ambulatory Visit: Payer: BLUE CROSS/BLUE SHIELD | Admitting: Physical Therapy

## 2017-06-13 ENCOUNTER — Ambulatory Visit: Payer: BLUE CROSS/BLUE SHIELD | Attending: Specialist | Admitting: Physical Therapy

## 2017-06-13 ENCOUNTER — Encounter: Payer: Self-pay | Admitting: Physical Therapy

## 2017-06-13 DIAGNOSIS — M25512 Pain in left shoulder: Secondary | ICD-10-CM | POA: Diagnosis not present

## 2017-06-13 DIAGNOSIS — G8929 Other chronic pain: Secondary | ICD-10-CM | POA: Diagnosis present

## 2017-06-13 DIAGNOSIS — M25612 Stiffness of left shoulder, not elsewhere classified: Secondary | ICD-10-CM | POA: Diagnosis present

## 2017-06-13 DIAGNOSIS — M6281 Muscle weakness (generalized): Secondary | ICD-10-CM | POA: Insufficient documentation

## 2017-06-13 NOTE — Therapy (Signed)
[place], [name], [address] Phone: [phone]   Fax:  [phone]  Physical Therapy Treatment  Patient Details  Name: Daniel Schneider MRN: [id number] Date of Birth: 09-Mar-1975 Referring Provider: Dr Clearance Coots    Encounter Date: 06/13/2017  PT End of Session - 06/13/17 1747    Visit Number  22    Number of Visits  28    Date for PT Re-Evaluation  06/29/17    PT Start Time  1022    PT Stop Time  1100    PT Time Calculation (min)  38 min    Activity Tolerance  Patient tolerated treatment well    Behavior During Therapy  [facility] for tasks assessed/performed       Past Medical History:  Diagnosis Date  . Cryptogenic stroke ([place]) 05/2015   [name] 05/27/2015; left hand weaker since (05/26/2015)  . PFO (patent foramen ovale)    a. 05/27/2015: s/p PFO closure w/ 76mm PFO occluder   . Sickle cell trait [facility])     Past Surgical History:  Procedure Laterality Date  . ASD AND VSD REPAIR  05/27/2015   [name] 05/27/2015  . CARDIAC CATHETERIZATION N/A 05/27/2015   Procedure: ASD/VSD Closure;  Surgeon: Sherren Mocha, MD;  Location: [place] CV LAB;  Service: Cardiovascular;  Laterality: N/A;  . TEE WITHOUT CARDIOVERSION N/A 04/21/2015   Procedure: TRANSESOPHAGEAL ECHOCARDIOGRAM (TEE);  Surgeon: Sueanne Margarita, MD;  Location: [facility] ENDOSCOPY;  Service: Cardiovascular;  Laterality: N/A;    There were no vitals filed for this visit.  Subjective Assessment - 06/13/17 1025    Subjective  No pain unless he works out.  He has been taking it easy. pain has gradually improved anterior shoulder.     Currently in Pain?  No/denies    Pain Score  -- after exercise 1-2/10    Pain Location  Shoulder    Pain Orientation  Left    Pain Descriptors / Indicators  Aching    Pain Radiating Towards  left neck  aching.      Aggravating Factors   overstretching,  sleeping on shoulder some exercises.     Pain Relieving Factors   taking it easy,  [place]    Effect of Pain on Daily Activities  difficulty using shoulder                       [facility] Adult PT Treatment/Exercise - 06/13/17 0001      Self-Care   Other Self-Care Comments   posture and how it relates to neck pain review,  Explained why he is not able to do push ups yet,  explained how he needs to keep up with light exercise,  light duty at work.  Answered questions       Shoulder Exercises: Standing   External Rotation  20 reps    Theraband Level (Shoulder External Rotation)  Level 2 (Red)    Extension  20 reps    Theraband Level (Shoulder Extension)  Level 2 (Red) cues,  no pos clicks    Row  20 reps    Theraband Level (Shoulder Row)  Level 2 (Red)      Shoulder Exercises: Pulleys   Flexion  2 minutes      Shoulder Exercises: ROM/Strengthening   UBE (Upper Arm Bike)  L1 2 forward 3 back NO PAIN      Ultrasound   Ultrasound Location  Left anterior shoulder  Ultrasound Parameters  1.5 watts/ cm2,  100 5 until last minute 50 % due to Burning noted.  No burrning with 50 %    Ultrasound Goals  Pain      Iontophoresis   Type of Iontophoresis  Dexamethasone    Location  Lt anterior shoulder    Dose  1cc    Time  slow release (4-6 hours)             PT Education - 06/13/17 1746    Education provided  Yes    Education Details  continue conservative exercise    Person(s) Educated  Patient    Methods  Explanation    Comprehension  Verbalized understanding       PT Short Term Goals - 05/19/17 1039      PT SHORT TERM GOAL #1   Title  Patient will increase shoulder flexion by 30 degrees     Baseline  full     Time  4    Period  Weeks    Status  Achieved      PT SHORT TERM GOAL #2   Title  Patient will increase his left passive shoulder ER by 25 degrees     Baseline  full passive ER     Time  4    Period  Weeks    Status  Achieved      PT SHORT TERM GOAL #3   Title  Patient will increase gross left shoulder strength  to 3+/5     Baseline  3+/5 ER and flexion     Time  4    Period  Weeks    Status  Achieved      PT SHORT TERM GOAL #4   Title  Patients will independent with HEP     Baseline  performing exercises at home     Time  4    Period  Weeks    Status  Achieved        PT Long Term Goals - 06/06/17 1018      PT LONG TERM GOAL #1   Title  Patient will reach behind his back to L3 without pain     Baseline  able to reach  with soreness    Time  8    Period  Weeks    Status  On-going      PT LONG TERM GOAL #2   Title  Patient will reach behind his head without pain in order to perfrom hygien tasks     Baseline  needs to move head forward or uses other hand.  Avoids this position for now.    Time  8    Period  Weeks      PT LONG TERM GOAL #3   Title  Patient will reach overhead to a shelf with a 2lb weight without pain     Status  Unable to assess            Plan - 06/13/17 1748    Clinical Impression Statement  Conservative exercises continue.  Patient has questions about when he can do push ups etc.  so conservative self care advice continues.  modalities helpful so continued.  Pain intermittant.   he missed the MD appiontment last week.     PT Next Visit Plan  Continue to advise the patient to follow HEP and stop trying to force his shoulder into flexion.  Check goals.  (LTG) Continue [place] and ionto  since it was  helpful.    PT Home Exercise Plan  pendulum exer , scapula retraction,  isometrics sub max,  bands    Consulted and Agree with Plan of Care  Patient       Patient will benefit from skilled therapeutic intervention in order to improve the following deficits and impairments:     Visit Diagnosis: Chronic left shoulder pain  Stiffness of left shoulder, not elsewhere classified  Muscle weakness (generalized)     Problem List Patient Active Problem List   Diagnosis Date Noted  . Anxiety 08/03/2016  . Cerebrovascular accident (CVA) due to embolism of right  middle cerebral artery ([employer]) 05/30/2015  . Prediabetes 05/27/2015  . Sickle cell trait ([place]) 05/26/2015  . PFO (patent foramen ovale)   . Acute CVA (cerebrovascular accident) ([place]) 04/20/2015  . Stroke ([place])   . HLD (hyperlipidemia)     HARRIS,KAREN PTA 06/13/2017, 5:52 PM  [facility] [address] [place], [name], [address] Phone: [phone]   Fax:  [phone]  Name: Daniel Schneider MRN: [id number] Date of Birth: 12/11/1975

## 2017-06-21 ENCOUNTER — Ambulatory Visit: Payer: BLUE CROSS/BLUE SHIELD

## 2017-06-21 DIAGNOSIS — M25512 Pain in left shoulder: Secondary | ICD-10-CM | POA: Diagnosis not present

## 2017-06-21 DIAGNOSIS — M6281 Muscle weakness (generalized): Secondary | ICD-10-CM

## 2017-06-21 DIAGNOSIS — G8929 Other chronic pain: Secondary | ICD-10-CM

## 2017-06-21 DIAGNOSIS — M25612 Stiffness of left shoulder, not elsewhere classified: Secondary | ICD-10-CM

## 2017-06-21 NOTE — Therapy (Signed)
[place], [name], [address] Phone: [phone]   Fax:  [phone]  Physical Therapy Treatment  Patient Details  Name: Daniel Schneider MRN: [id number] Date of Birth: 1975-04-29 Referring Provider: Dr Clearance Coots    Encounter Date: 06/21/2017  PT End of Session - 06/21/17 0923    Visit Number  23    Number of Visits  28    Date for PT Re-Evaluation  06/29/17    Authorization Type  BCBS     PT Start Time  [phone]    PT Stop Time  1005    PT Time Calculation (min)  42 min    Activity Tolerance  Patient tolerated treatment well    Behavior During Therapy  [facility] for tasks assessed/performed       Past Medical History:  Diagnosis Date  . Cryptogenic stroke ([place]) 05/2015   [name] 05/27/2015; left hand weaker since (05/26/2015)  . PFO (patent foramen ovale)    a. 05/27/2015: s/p PFO closure w/ 106mm PFO occluder   . Sickle cell trait [facility])     Past Surgical History:  Procedure Laterality Date  . ASD AND VSD REPAIR  05/27/2015   [name] 05/27/2015  . CARDIAC CATHETERIZATION N/A 05/27/2015   Procedure: ASD/VSD Closure;  Surgeon: Sherren Mocha, MD;  Location: [place] CV LAB;  Service: Cardiovascular;  Laterality: N/A;  . TEE WITHOUT CARDIOVERSION N/A 04/21/2015   Procedure: TRANSESOPHAGEAL ECHOCARDIOGRAM (TEE);  Surgeon: Sueanne Margarita, MD;  Location: [facility] ENDOSCOPY;  Service: Cardiovascular;  Laterality: N/A;    There were no vitals filed for this visit.  Subjective Assessment - 06/21/17 0927    Subjective  2/10 pain today. MD said Ludwig Clarks that will have some popping and clicking  as he had some bone shaving with surgery.   Feels shoulder weak. MD felt healing good but could do better.      Pain Score  2     Pain Location  Shoulder    Pain Orientation  Left    Pain Descriptors / Indicators  Aching    Pain Type  Surgical pain    Pain Onset  More than a month ago    Pain Frequency  Intermittent     Aggravating Factors   after using arm.     Pain Relieving Factors  rest, [place]                       [facility] Adult PT Treatment/Exercise - 06/21/17 0001      Shoulder Exercises: Standing   External Rotation  -- 25 reps    Theraband Level (Shoulder External Rotation)  Level 2 (Red)    Extension  -- 25 reps    Theraband Level (Shoulder Extension)  Level 2 (Red)    Row  -- 25 reps    Theraband Level (Shoulder Row)  Level 2 (Red)      Shoulder Exercises: Pulleys   Flexion  2 minutes      Shoulder Exercises: ROM/Strengthening   UBE (Upper Arm Bike)  L2 3 forward 3 back      Ultrasound   Ultrasound Location  LT ant shoulder    Ultrasound Parameters  1.5 Wcm2  50%    Ultrasound Goals  Pain      Iontophoresis   Type of Iontophoresis  Dexamethasone    Location  Lt anterior shoulder    Dose  1cc    Time  slow release (4-6  hours)               PT Short Term Goals - 05/19/17 1039      PT SHORT TERM GOAL #1   Title  Patient will increase shoulder flexion by 30 degrees     Baseline  full     Time  4    Period  Weeks    Status  Achieved      PT SHORT TERM GOAL #2   Title  Patient will increase his left passive shoulder ER by 25 degrees     Baseline  full passive ER     Time  4    Period  Weeks    Status  Achieved      PT SHORT TERM GOAL #3   Title  Patient will increase gross left shoulder strength to 3+/5     Baseline  3+/5 ER and flexion     Time  4    Period  Weeks    Status  Achieved      PT SHORT TERM GOAL #4   Title  Patients will independent with HEP     Baseline  performing exercises at home     Time  4    Period  Weeks    Status  Achieved        PT Long Term Goals - 06/06/17 1018      PT LONG TERM GOAL #1   Title  Patient will reach behind his back to L3 without pain     Baseline  able to reach  with soreness    Time  8    Period  Weeks    Status  On-going      PT LONG TERM GOAL #2   Title  Patient will reach behind his head  without pain in order to perfrom hygien tasks     Baseline  needs to move head forward or uses other hand.  Avoids this position for now.    Time  8    Period  Weeks      PT LONG TERM GOAL #3   Title  Patient will reach overhead to a shelf with a 2lb weight without pain     Status  Unable to assess            Plan - 06/21/17 0924    Clinical Impression Statement  Continues with intermittant pain. Feels better post modalities.   Continue per plan.     PT Treatment/Interventions  ADLs/Self Care Home Management;Cryotherapy;Electrical Stimulation;Therapeutic activities;Therapeutic exercise;Neuromuscular re-education;Patient/family education;Manual techniques;Taping;Passive range of motion;Ultrasound;Iontophoresis 4mg /ml Dexamethasone    PT Next Visit Plan  Continue to advise the patient to follow HEP and stop trying to force his shoulder into flexion.  Check goals.  (LTG) Continue [place] and ionto  since it was helpful.    PT Home Exercise Plan  pendulum exer , scapula retraction,  isometrics sub max,  bands    Consulted and Agree with Plan of Care  Patient       Patient will benefit from skilled therapeutic intervention in order to improve the following deficits and impairments:  Pain, Impaired UE functional use, Decreased activity tolerance, Decreased endurance, Decreased range of motion, Decreased strength  Visit Diagnosis: Chronic left shoulder pain  Stiffness of left shoulder, not elsewhere classified  Muscle weakness (generalized)     Problem List Patient Active Problem List   Diagnosis Date Noted  . Anxiety 08/03/2016  . Cerebrovascular accident (CVA) due to  embolism of right middle cerebral artery ([place]) 05/30/2015  . Prediabetes 05/27/2015  . Sickle cell trait ([place]) 05/26/2015  . PFO (patent foramen ovale)   . Acute CVA (cerebrovascular accident) ([place]) 04/20/2015  . Stroke ([place])   . HLD (hyperlipidemia)     Darrel Hoover  PT 06/21/2017, 10:03 AM  [facility] [address] [place], [name], [address] Phone: [phone]   Fax:  [phone]  Name: Daniel Schneider MRN: [id number] Date of Birth: [birth year]

## 2017-06-22 ENCOUNTER — Ambulatory Visit: Payer: BLUE CROSS/BLUE SHIELD | Admitting: Physical Therapy

## 2017-06-22 ENCOUNTER — Encounter: Payer: Self-pay | Admitting: Physical Therapy

## 2017-06-22 DIAGNOSIS — M25512 Pain in left shoulder: Principal | ICD-10-CM

## 2017-06-22 DIAGNOSIS — M25612 Stiffness of left shoulder, not elsewhere classified: Secondary | ICD-10-CM

## 2017-06-22 DIAGNOSIS — G8929 Other chronic pain: Secondary | ICD-10-CM

## 2017-06-22 DIAGNOSIS — M6281 Muscle weakness (generalized): Secondary | ICD-10-CM

## 2017-06-22 NOTE — Therapy (Signed)
Daniel Schneider, [name], [address] Phone: [phone]   Fax:  [phone]  Physical Therapy Treatment  Patient Details  Name: Daniel Schneider MRN: [id number] Date of Birth: 1975/07/22 Referring Provider: Dr Clearance Coots    Encounter Date: 06/22/2017  PT End of Session - 06/22/17 0939    Visit Number  24    Number of Visits  28    Date for PT Re-Evaluation  06/29/17    Authorization Type  BCBS     PT Start Time  0932    PT Stop Time  1013    PT Time Calculation (min)  41 min    Activity Tolerance  Patient tolerated treatment well    Behavior During Therapy  [facility] for tasks assessed/performed       Past Medical History:  Diagnosis Date  . Cryptogenic stroke ([place]) 05/2015   [name] 05/27/2015; left hand weaker since (05/26/2015)  . PFO (patent foramen ovale)    a. 05/27/2015: s/p PFO closure w/ 68mm PFO occluder   . Sickle cell trait [facility])     Past Surgical History:  Procedure Laterality Date  . ASD AND VSD REPAIR  05/27/2015   [name] 05/27/2015  . CARDIAC CATHETERIZATION N/A 05/27/2015   Procedure: ASD/VSD Closure;  Surgeon: Sherren Mocha, MD;  Location: [facility];  Service: Cardiovascular;  Laterality: N/A;  . TEE WITHOUT CARDIOVERSION N/A 04/21/2015   Procedure: TRANSESOPHAGEAL ECHOCARDIOGRAM (TEE);  Surgeon: Sueanne Margarita, MD;  Location: [facility] ENDOSCOPY;  Service: Cardiovascular;  Laterality: N/A;    There were no vitals filed for this visit.  Subjective Assessment - 06/22/17 0936    Subjective  Patient reports no pain today. He reports he slept really good last night.     Limitations  House hold activities;Lifting    Patient Stated Goals  Riding his bike     Currently in Pain?  No/denies                       [facility] Adult PT Treatment/Exercise - 06/22/17 0001      Shoulder Exercises: Supine   Other Supine Exercises  supine low range flexion 2x15 ; horizontal abdcution 2x15  1lb     Other Supine Exercises  Serratus punch 3x10       Shoulder Exercises: Sidelying   External Rotation  20 reps    External Rotation Weight (lbs)  1      Shoulder Exercises: Standing   External Rotation  20 reps    Theraband Level (Shoulder External Rotation)  Level 2 (Red)    Internal Rotation  20 reps    Theraband Level (Shoulder Internal Rotation)  Level 3 (Green)    Extension  20 reps    Theraband Level (Shoulder Extension)  Level 3 (Green)    Row  20 reps    Theraband Level (Shoulder Row)  Level 3 (Green)    Other Standing Exercises  cabinet reach x10 2lb weight       Shoulder Exercises: Pulleys   Flexion  2 minutes      Shoulder Exercises: ROM/Strengthening   UBE (Upper Arm Bike)  L2 3 2 forward 2 back      Iontophoresis   Type of Iontophoresis  Dexamethasone    Location  Lt anterior shoulder    Dose  1cc    Time  slow release (4-6 hours)  PT Education - 06/22/17 0939    Education provided  Yes    Education Details  reviewed HEP     Person(s) Educated  Patient    Methods  Explanation;Demonstration;Verbal cues;Tactile cues    Comprehension  Verbalized understanding;Returned demonstration;Verbal cues required;Need further instruction       PT Short Term Goals - 05/19/17 1039      PT SHORT TERM GOAL #1   Title  Patient will increase shoulder flexion by 30 degrees     Baseline  full     Time  4    Period  Weeks    Status  Achieved      PT SHORT TERM GOAL #2   Title  Patient will increase his left passive shoulder ER by 25 degrees     Baseline  full passive ER     Time  4    Period  Weeks    Status  Achieved      PT SHORT TERM GOAL #3   Title  Patient will increase gross left shoulder strength to 3+/5     Baseline  3+/5 ER and flexion     Time  4    Period  Weeks    Status  Achieved      PT SHORT TERM GOAL #4   Title  Patients will independent with HEP     Baseline  performing exercises at home     Time  4    Period  Weeks     Status  Achieved        PT Long Term Goals - 06/22/17 1008      PT LONG TERM GOAL #1   Title  Patient will reach behind his back to L3 without pain     Baseline  able to reach  with soreness    Time  8    Status  On-going      PT LONG TERM GOAL #2   Title  Patient will reach behind his head without pain in order to perfrom hygien tasks     Baseline  reaching behind his back without pain     Time  8    Period  Weeks    Status  Achieved      PT LONG TERM GOAL #3   Title  Patient will reach overhead to a shelf with a 2lb weight without pain     Baseline  started today.,     Time  8    Period  Weeks    Status  On-going            Plan - 06/22/17 1002    Clinical Impression Statement  Therapy advanced stregthening today. he reports at home he only has a yellow band. he has been using a red here. Therapy advanced him to a green. He reported some soreness with stregthening. Ultrasound held for time today. Therapy will try [place] for a few more visits if needed.     Clinical Presentation  Stable    Clinical Decision Making  Low    Rehab Potential  Excellent    PT Frequency  2x / week    PT Duration  8 weeks    PT Treatment/Interventions  ADLs/Self Care Home Management;Cryotherapy;Electrical Stimulation;Therapeutic activities;Therapeutic exercise;Neuromuscular re-education;Patient/family education;Manual techniques;Taping;Passive range of motion;Ultrasound;Iontophoresis 4mg /ml Dexamethasone    PT Next Visit Plan  Continue to advise the patient to follow HEP and stop trying to force his shoulder into flexion.  Check goals.  (LTG)  Continue [place] and ionto  since it was helpful.    PT Home Exercise Plan  pendulum exer , scapula retraction,  isometrics sub max,  bands       Patient will benefit from skilled therapeutic intervention in order to improve the following deficits and impairments:  Pain, Impaired UE functional use, Decreased activity tolerance, Decreased endurance, Decreased  range of motion, Decreased strength  Visit Diagnosis: Chronic left shoulder pain  Stiffness of left shoulder, not elsewhere classified  Muscle weakness (generalized)     Problem List Patient Active Problem List   Diagnosis Date Noted  . Anxiety 08/03/2016  . Cerebrovascular accident (CVA) due to embolism of right middle cerebral artery ([place]) 05/30/2015  . Prediabetes 05/27/2015  . Sickle cell trait ([place]) 05/26/2015  . PFO (patent foramen ovale)   . Acute CVA (cerebrovascular accident) ([place]) 04/20/2015  . Stroke ([place])   . HLD (hyperlipidemia)     Carney Living  PT DPT  06/22/2017, 2:47 PM  [facility] [address] [place], [name], [address] Phone: [phone]   Fax:  [phone]  Name: ELMAN DETTMAN MRN: [id number] Date of Birth: [birth year]

## 2017-06-30 ENCOUNTER — Encounter: Payer: Self-pay | Admitting: Physical Therapy

## 2017-06-30 ENCOUNTER — Ambulatory Visit: Payer: BLUE CROSS/BLUE SHIELD | Admitting: Physical Therapy

## 2017-06-30 DIAGNOSIS — G8929 Other chronic pain: Secondary | ICD-10-CM

## 2017-06-30 DIAGNOSIS — M6281 Muscle weakness (generalized): Secondary | ICD-10-CM

## 2017-06-30 DIAGNOSIS — M25512 Pain in left shoulder: Principal | ICD-10-CM

## 2017-06-30 DIAGNOSIS — M25612 Stiffness of left shoulder, not elsewhere classified: Secondary | ICD-10-CM

## 2017-06-30 NOTE — Therapy (Addendum)
[place], [name], [address] Phone: [phone]   Fax:  [phone]  Physical Therapy Treatment / Re-certification  Patient Details  Name: Daniel Schneider MRN: [id number] Date of Birth: [birth year] Referring Provider: Dr Clearance Coots    Encounter Date: 06/30/2017  PT End of Session - 06/30/17 0905    Visit Number  25    Number of Visits  33    Date for PT Re-Evaluation  07/28/17    Authorization Type  BCBS     PT Start Time  0902    PT Stop Time  0937    PT Time Calculation (min)  35 min       Past Medical History:  Diagnosis Date  . Cryptogenic stroke ([place]) 05/2015   [name] 05/27/2015; left hand weaker since (05/26/2015)  . PFO (patent foramen ovale)    a. 05/27/2015: s/p PFO closure w/ 9mm PFO occluder   . Sickle cell trait [facility])     Past Surgical History:  Procedure Laterality Date  . ASD AND VSD REPAIR  05/27/2015   [name] 05/27/2015  . CARDIAC CATHETERIZATION N/A 05/27/2015   Procedure: ASD/VSD Closure;  Surgeon: Sherren Mocha, MD;  Location: [facility];  Service: Cardiovascular;  Laterality: N/A;  . TEE WITHOUT CARDIOVERSION N/A 04/21/2015   Procedure: TRANSESOPHAGEAL ECHOCARDIOGRAM (TEE);  Surgeon: Sueanne Margarita, MD;  Location: [facility] ENDOSCOPY;  Service: Cardiovascular;  Laterality: N/A;    There were no vitals filed for this visit.  Subjective Assessment - 06/30/17 0904    Currently in Pain?  No/denies         [facility] PT Assessment - 06/30/17 0001      AROM   AROM Assessment Site  Elbow    Right/Left Shoulder  Left;Right    Left Shoulder Flexion  128 Degrees    Left Shoulder ABduction  102 Degrees    Left Shoulder Internal Rotation  -- Can reach to L1    Left Shoulder External Rotation  -- reach to T2    Right/Left Elbow  Right;Left      Strength   Strength Assessment Site  Elbow    Left Shoulder Flexion  4-/5    Left Shoulder Internal Rotation  4+/5    Left Shoulder  External Rotation  4+/5    Right/Left Elbow  Right;Left    Right Elbow Extension  5/5    Left Elbow Extension  4-/5                   [facility] Adult PT Treatment/Exercise - 06/30/17 0001      Shoulder Exercises: Standing   External Rotation  20 reps    Theraband Level (Shoulder External Rotation)  Level 3 (Green)    Internal Rotation  20 reps    Theraband Level (Shoulder Internal Rotation)  Level 3 (Green)    ABduction  Strengthening;10 reps;Weights 1    Row  20 reps    Theraband Level (Shoulder Row)  Level 3 (Green)    Other Standing Exercises  wall push ups x10    Other Standing Exercises  cabinet reach x15 2lb weight       Shoulder Exercises: ROM/Strengthening   UBE (Upper Arm Bike)  L2 3 2 forward 2 back             PT Education - 06/30/17 0942    Education provided  Yes    Education Details  HEP    Person(s) Educated  Patient    Methods  Explanation    Comprehension  Verbalized understanding       PT Short Term Goals - 05/19/17 1039      PT SHORT TERM GOAL #1   Title  Patient will increase shoulder flexion by 30 degrees     Baseline  full     Time  4    Period  Weeks    Status  Achieved      PT SHORT TERM GOAL #2   Title  Patient will increase his left passive shoulder ER by 25 degrees     Baseline  full passive ER     Time  4    Period  Weeks    Status  Achieved      PT SHORT TERM GOAL #3   Title  Patient will increase gross left shoulder strength to 3+/5     Baseline  3+/5 ER and flexion     Time  4    Period  Weeks    Status  Achieved      PT SHORT TERM GOAL #4   Title  Patients will independent with HEP     Baseline  performing exercises at home     Time  4    Period  Weeks    Status  Achieved        PT Long Term Goals - 06/22/17 1008      PT LONG TERM GOAL #1   Title  Patient will reach behind his back to L3 without pain     Baseline  able to reach  with soreness    Time  8    Status  On-going      PT LONG TERM GOAL #2    Title  Patient will reach behind his head without pain in order to perfrom hygien tasks     Baseline  reaching behind his back without pain     Time  8    Period  Weeks    Status  Achieved      PT LONG TERM GOAL #3   Title  Patient will reach overhead to a shelf with a 2lb weight without pain     Baseline  started today.,     Time  8    Period  Weeks    Status  On-going           Plan - 06/30/17 0908    Clinical Impression Statement  Pt reports slight pain with exercises, sleeping wrong. He can catch a baseball in his left hand. He was able to perform 2Lb cabinet reaching 15 reps today with fatige and pulling noted. He reports weakness with lifting, carrying, holding items to perform job duties. His strength and ROM have improved with weakness noted in left shoulder flexion and elbow extension. Asked him to begin cabinet reaching with 2lbs and wall push ups. He will beneift from continued PT to [facility] strength needed for job related tasks.     PT Frequency  2x / week    PT Duration  4 weeks    PT Treatment/Interventions  ADLs/Self Care Home Management;Cryotherapy;Electrical Stimulation;Therapeutic activities;Therapeutic exercise;Neuromuscular re-education;Patient/family education;Manual techniques;Taping;Passive range of motion;Ultrasound;Iontophoresis 4mg /ml Dexamethasone    PT Next Visit Plan  advance flexion and tricp strength with caution. Review cabinet reaching and wall push ups. Work toward regaining functional strength with LUE,, gentle UE weight bearing     PT Home Exercise Plan  pendulum exer , scapula retraction,  isometrics  sub max,  bands, wall push ups, cabinet reaching    Consulted and Agree with Plan of Care  Patient         Patient will benefit from skilled therapeutic intervention in order to improve the following deficits and impairments:  Pain, Impaired UE functional use, Decreased activity tolerance, Decreased endurance, Decreased range of motion, Decreased  strength  Visit Diagnosis: Chronic left shoulder pain  Stiffness of left shoulder, not elsewhere classified  Muscle weakness (generalized)     Problem List Patient Active Problem List   Diagnosis Date Noted  . Anxiety 08/03/2016  . Cerebrovascular accident (CVA) due to embolism of right middle cerebral artery ([place]) 05/30/2015  . Prediabetes 05/27/2015  . Sickle cell trait ([place]) 05/26/2015  . PFO (patent foramen ovale)   . Acute CVA (cerebrovascular accident) ([place]) 04/20/2015  . Stroke ([place])   . HLD (hyperlipidemia)     Dorene Ar, PTA 06/30/2017, 9:52 AM     [place]arr [place] PT, DPT, LAT, ATC  06/30/17  10:00 AM      [facility] [address] [place], [name], [address] Phone: [phone]   Fax:  [phone]  Name: KALYAN BARABAS MRN: [id number] Date of Birth: [birth year]

## 2017-06-30 NOTE — Addendum Note (Signed)
Addended by: Larey Days on: 06/30/2017 10:03 AM   Modules accepted: Orders

## 2017-06-30 NOTE — Patient Instructions (Signed)
Perform cabinet reach with 2lbs 15 reps, 2 x per day Perform wall push up x 15 , 2 x per day.

## 2017-07-06 ENCOUNTER — Ambulatory Visit: Payer: BLUE CROSS/BLUE SHIELD | Admitting: Physical Therapy

## 2017-07-06 ENCOUNTER — Encounter: Payer: Self-pay | Admitting: Physical Therapy

## 2017-07-06 DIAGNOSIS — M25512 Pain in left shoulder: Secondary | ICD-10-CM | POA: Diagnosis not present

## 2017-07-06 DIAGNOSIS — M25612 Stiffness of left shoulder, not elsewhere classified: Secondary | ICD-10-CM

## 2017-07-06 DIAGNOSIS — G8929 Other chronic pain: Secondary | ICD-10-CM

## 2017-07-06 DIAGNOSIS — M6281 Muscle weakness (generalized): Secondary | ICD-10-CM

## 2017-07-06 NOTE — Therapy (Signed)
**Note Daniel-Identified via Obfuscation** [place], [name], [address] Phone: [phone]   Fax:  [phone]  Physical Therapy Treatment  Patient Details  Name: Daniel Schneider MRN: [id number] Date of Birth: January 03, 1976 Referring Provider: Dr Clearance Coots    Encounter Date: 07/06/2017  PT End of Session - 07/06/17 1533    Visit Number  26    Number of Visits  33    Date for PT Re-Evaluation  07/28/17    PT Start Time  1418    PT Stop Time  1500    PT Time Calculation (min)  42 min    Activity Tolerance  Patient tolerated treatment well    Behavior During Therapy  [facility] for tasks assessed/performed       Past Medical History:  Diagnosis Date  . Cryptogenic stroke ([place]) 05/2015   [name] 05/27/2015; left hand weaker since (05/26/2015)  . PFO (patent foramen ovale)    a. 05/27/2015: s/p PFO closure w/ 55mm PFO occluder   . Sickle cell trait [facility])     Past Surgical History:  Procedure Laterality Date  . ASD AND VSD REPAIR  05/27/2015   [name] 05/27/2015  . CARDIAC CATHETERIZATION N/A 05/27/2015   Procedure: ASD/VSD Closure;  Surgeon: Sherren Mocha, MD;  Location: [facility] LAB;  Service: Cardiovascular;  Laterality: N/A;  . TEE WITHOUT CARDIOVERSION N/A 04/21/2015   Procedure: TRANSESOPHAGEAL ECHOCARDIOGRAM (TEE);  Surgeon: Sueanne Margarita, MD;  Location: [facility] ENDOSCOPY;  Service: Cardiovascular;  Laterality: N/A;    There were no vitals filed for this visit.  Subjective Assessment - 07/06/17 1424    Subjective  Sore only.  Doing OK with exercises and task at work.     Currently in Pain?  No/denies no pain just dull    Pain Location  Shoulder    Pain Orientation  Left;Anterior    Pain Descriptors / Indicators  Dull    Pain Radiating Towards  some left neck aching    Pain Frequency  Intermittent    Aggravating Factors   after using arm    Pain Relieving Factors  rest,  change of position,  stretching    Effect of Pain on Daily  Activities  difficulty with using shoulder,  not yet doing a lot of things overhead    Multiple Pain Sites  No         [facility] PT Assessment - 07/06/17 0001      PROM   Left Shoulder ABduction  134 Degrees wall slide                   [facility] Adult PT Treatment/Exercise - 07/06/17 0001      Shoulder Exercises: Prone   Retraction  10 reps    Retraction Limitations  0 LBS    Flexion  10 reps limited ROM,  AROM    Extension  10 reps with scapular set,  to hip only  1 LB weight    Horizontal ABduction 1  AROM;10 reps with scapular set    Other Prone Exercises  tricep kick 1 LB 10 X with scapular retraction    Other Prone Exercises  row with scapular set,  1 and 5 LBS X 10 each      Shoulder Exercises: Standing   External Rotation  20 reps    Theraband Level (Shoulder External Rotation)  Level 3 (Green) rolled towel    Internal Rotation  20 reps rolled towel.feels movement anterior  with release  no pain    Theraband Level (Shoulder Internal Rotation)  Level 3 (Green)    Extension  20 reps    Theraband Level (Shoulder Extension)  Level 3 (Green)    Row  20 reps    Theraband Level (Shoulder Row)  Level 3 (Green)    Other Standing Exercises  5 x 2 sets difficult    Other Standing Exercises  cabinet reach x15 2lb weight  3 shelves lowering between needs wall stretch prior for top      Shoulder Exercises: Stretch   Wall Stretch - Flexion  -- 10 reps    Wall Stretch - ABduction  5 reps             PT Education - 07/06/17 1533    Education provided  Yes    Education Details  exercise form    Methods  Demonstration;Tactile cues;Verbal cues;Explanation    Comprehension  Verbalized understanding;Returned demonstration       PT Short Term Goals - 05/19/17 1039      PT SHORT TERM GOAL #1   Title  Patient will increase shoulder flexion by 30 degrees     Baseline  full     Time  4    Period  Weeks    Status  Achieved      PT SHORT TERM GOAL #2   Title  Patient  will increase his left passive shoulder ER by 25 degrees     Baseline  full passive ER     Time  4    Period  Weeks    Status  Achieved      PT SHORT TERM GOAL #3   Title  Patient will increase gross left shoulder strength to 3+/5     Baseline  3+/5 ER and flexion     Time  4    Period  Weeks    Status  Achieved      PT SHORT TERM GOAL #4   Title  Patients will independent with HEP     Baseline  performing exercises at home     Time  4    Period  Weeks    Status  Achieved        PT Long Term Goals - 07/06/17 1558      PT LONG TERM GOAL #1   Title  Patient will reach behind his back to L3 without pain     Time  8    Period  Weeks    Status  Unable to assess      PT LONG TERM GOAL #2   Title  Patient will reach behind his head without pain in order to perfrom hygien tasks     Time  8    Period  Weeks    Status  Achieved      PT LONG TERM GOAL #3   Title  Patient will reach overhead to a shelf with a 2lb weight without pain     Baseline  working on this,  difficult ROM for top shelf,  needs to stretch on wall prior.  can stretch    Time  8    Period  Weeks    Status  On-going            Plan - 07/06/17 1534    Clinical Impression Statement  Soreness continues vs pain.  He has to hang sheetrock above head so he is holding it using his elbow in abduction left and use right  arm for the main lift and hold.Marland KitchenPROM abduction 134 while stretchin on wall.  patient declined the need for modalities for pain at end of session.     PT Next Visit Plan  advance flexion and tricp strength with caution. Review cabinet reaching and wall push ups. Work toward regaining functional strength with LUE,, gentle UE weight bearing .  consider more prone exercises    PT Home Exercise Plan  pendulum exer , scapula retraction,  isometrics sub max,  bands, wall push ups, cabinet reaching    Consulted and Agree with Plan of Care  Patient       Patient will benefit from skilled therapeutic  intervention in order to improve the following deficits and impairments:     Visit Diagnosis: Chronic left shoulder pain  Stiffness of left shoulder, not elsewhere classified  Muscle weakness (generalized)     Problem List Patient Active Problem List   Diagnosis Date Noted  . Anxiety 08/03/2016  . Cerebrovascular accident (CVA) due to embolism of right middle cerebral artery ([place]) 05/30/2015  . Prediabetes 05/27/2015  . Sickle cell trait ([place]) 05/26/2015  . PFO (patent foramen ovale)   . Acute CVA (cerebrovascular accident) ([place]) 04/20/2015  . Stroke ([place])   . HLD (hyperlipidemia)     Charlsie Fleeger PTA 07/06/2017, 4:00 PM  [facility] [address] [place], [name], [address] Phone: [phone]   Fax:  [phone]  Name: Daniel Schneider MRN: [id number] Date of Birth: 01/18/1976

## 2017-07-07 ENCOUNTER — Ambulatory Visit: Payer: BLUE CROSS/BLUE SHIELD

## 2017-07-07 DIAGNOSIS — M25612 Stiffness of left shoulder, not elsewhere classified: Secondary | ICD-10-CM

## 2017-07-07 DIAGNOSIS — M25512 Pain in left shoulder: Secondary | ICD-10-CM | POA: Diagnosis not present

## 2017-07-07 DIAGNOSIS — M6281 Muscle weakness (generalized): Secondary | ICD-10-CM

## 2017-07-07 DIAGNOSIS — G8929 Other chronic pain: Secondary | ICD-10-CM

## 2017-07-07 NOTE — Therapy (Signed)
[place], [name], [address] Phone: [phone]   Fax:  [phone]  Physical Therapy Treatment  Patient Details  Name: Daniel Schneider MRN: [id number] Date of Birth: 08-03-1975 Referring Provider: Dr Clearance Coots    Encounter Date: 07/07/2017  PT End of Session - 07/07/17 0846    Visit Number  27    Number of Visits  33    Date for PT Re-Evaluation  07/28/17    Authorization Type  BCBS     PT Start Time  0845    PT Stop Time  0930    PT Time Calculation (min)  45 min    Activity Tolerance  Patient tolerated treatment well    Behavior During Therapy  [facility] for tasks assessed/performed       Past Medical History:  Diagnosis Date  . Cryptogenic stroke ([place]) 05/2015   [name] 05/27/2015; left hand weaker since (05/26/2015)  . PFO (patent foramen ovale)    a. 05/27/2015: s/p PFO closure w/ 51mm PFO occluder   . Sickle cell trait [facility])     Past Surgical History:  Procedure Laterality Date  . ASD AND VSD REPAIR  05/27/2015   [name] 05/27/2015  . CARDIAC CATHETERIZATION N/A 05/27/2015   Procedure: ASD/VSD Closure;  Surgeon: Sherren Mocha, MD;  Location: [place] CV LAB;  Service: Cardiovascular;  Laterality: N/A;  . TEE WITHOUT CARDIOVERSION N/A 04/21/2015   Procedure: TRANSESOPHAGEAL ECHOCARDIOGRAM (TEE);  Surgeon: Sueanne Margarita, MD;  Location: [facility] ENDOSCOPY;  Service: Cardiovascular;  Laterality: N/A;    There were no vitals filed for this visit.  Subjective Assessment - 07/06/17 1424    Subjective  Sore only.  Doing OK with exercises and task at work.     Currently in Pain?  No/denies no pain just dull    Pain Location  Shoulder    Pain Orientation  Left;Anterior    Pain Descriptors / Indicators  Dull    Pain Radiating Towards  some left neck aching    Pain Frequency  Intermittent    Aggravating Factors   after using arm    Pain Relieving Factors  rest,  change of position,  stretching     Effect of Pain on Daily Activities  difficulty with using shoulder,  not yet doing a lot of things overhead    Multiple Pain Sites  No                       [facility] Adult PT Treatment/Exercise - 07/07/17 0001      Shoulder Exercises: Prone   Extension  10 reps    Extension Limitations  3 pounds    Other Prone Exercises  row with 5 pounds x 15, then extension x 15 5 pounds      Shoulder Exercises: Standing   External Rotation  20 reps    Theraband Level (Shoulder External Rotation)  Level 3 (Green)    Internal Rotation  20 reps    Theraband Level (Shoulder Internal Rotation)  Level 3 (Green)    Extension  20 reps    Theraband Level (Shoulder Extension)  Level 3 (Green)    Row  20 reps    Theraband Level (Shoulder Row)  Level 3 (Green)    Other Standing Exercises  5 x 2 sets    Other Standing Exercises  push ups off counter x 15, then  cabinet reach x 5 with 5 poounds , then 4  pounds x 7  then 3 pounds x 10      Shoulder Exercises: ROM/Strengthening   UBE (Upper Arm Bike)  60 degres /sec x 5 min Cybex    Cybex Row  20 reps 35 pounds    Other ROM/Strengthening Exercises  Tricep pull downs x 3  35 pounds. Then 25 pounds x 5 then 20 pounds x 12      Shoulder Exercises: Stretch   Wall Stretch - Flexion  --    Wall Stretch - ABduction  --             PT Education - 07/06/17 1533    Education provided  Yes    Education Details  exercise form    Methods  Demonstration;Tactile cues;Verbal cues;Explanation    Comprehension  Verbalized understanding;Returned demonstration       PT Short Term Goals - 05/19/17 1039      PT SHORT TERM GOAL #1   Title  Patient will increase shoulder flexion by 30 degrees     Baseline  full     Time  4    Period  Weeks    Status  Achieved      PT SHORT TERM GOAL #2   Title  Patient will increase his left passive shoulder ER by 25 degrees     Baseline  full passive ER     Time  4    Period  Weeks    Status  Achieved      PT  SHORT TERM GOAL #3   Title  Patient will increase gross left shoulder strength to 3+/5     Baseline  3+/5 ER and flexion     Time  4    Period  Weeks    Status  Achieved      PT SHORT TERM GOAL #4   Title  Patients will independent with HEP     Baseline  performing exercises at home     Time  4    Period  Weeks    Status  Achieved        PT Long Term Goals - 07/06/17 1558      PT LONG TERM GOAL #1   Title  Patient will reach behind his back to L3 without pain     Time  8    Period  Weeks    Status  Unable to assess      PT LONG TERM GOAL #2   Title  Patient will reach behind his head without pain in order to perfrom hygien tasks     Time  8    Period  Weeks    Status  Achieved      PT LONG TERM GOAL #3   Title  Patient will reach overhead to a shelf with a 2lb weight without pain     Baseline  working on this,  difficult ROM for top shelf,  needs to stretch on wall prior.  can stretch    Time  8    Period  Weeks    Status  On-going            Plan - 07/07/17 0846    Clinical Impression Statement  No significant incr soreness . Reports fatigue.   Cued for not forcing strenght and to have exercises clean in motion without compensation    PT Treatment/Interventions  ADLs/Self Care Home Management;Cryotherapy;Electrical Stimulation;Therapeutic activities;Therapeutic exercise;Neuromuscular re-education;Patient/family education;Manual techniques;Taping;Passive range of motion;Ultrasound;Iontophoresis 4mg /ml Dexamethasone    PT  Home Exercise Plan  pendulum exer , scapula retraction,  isometrics sub max,  bands, wall push ups, cabinet reaching    Consulted and Agree with Plan of Care  Patient       Patient will benefit from skilled therapeutic intervention in order to improve the following deficits and impairments:  Pain, Impaired UE functional use, Decreased activity tolerance, Decreased endurance, Decreased range of motion, Decreased strength  Visit  Diagnosis: Chronic left shoulder pain  Stiffness of left shoulder, not elsewhere classified  Muscle weakness (generalized)     Problem List Patient Active Problem List   Diagnosis Date Noted  . Anxiety 08/03/2016  . Cerebrovascular accident (CVA) due to embolism of right middle cerebral artery ([place]) 05/30/2015  . Prediabetes 05/27/2015  . Sickle cell trait ([place]) 05/26/2015  . PFO (patent foramen ovale)   . Acute CVA (cerebrovascular accident) ([place]) 04/20/2015  . Stroke ([place])   . HLD (hyperlipidemia)     Darrel Hoover PT 07/07/2017, 9:30 AM  [facility] [address] [place], [name], [address] Phone: [phone]   Fax:  [phone]  Name: Daniel Schneider MRN: [id number] Date of Birth: Jun 17, 1975

## 2017-07-12 ENCOUNTER — Ambulatory Visit: Payer: BLUE CROSS/BLUE SHIELD | Attending: Specialist | Admitting: Physical Therapy

## 2017-07-12 DIAGNOSIS — M25512 Pain in left shoulder: Secondary | ICD-10-CM | POA: Diagnosis not present

## 2017-07-12 DIAGNOSIS — M6281 Muscle weakness (generalized): Secondary | ICD-10-CM | POA: Diagnosis present

## 2017-07-12 DIAGNOSIS — G8929 Other chronic pain: Secondary | ICD-10-CM

## 2017-07-12 DIAGNOSIS — M25612 Stiffness of left shoulder, not elsewhere classified: Secondary | ICD-10-CM | POA: Insufficient documentation

## 2017-07-12 NOTE — Therapy (Signed)
[place], [name], [address] Phone: [phone]   Fax:  [phone]  Physical Therapy Treatment  Patient Details  Name: Daniel Schneider MRN: [id number] Date of Birth: [birth year] Referring Provider: Dr Clearance Coots    Encounter Date: 07/12/2017  PT End of Session - 07/12/17 0935    Visit Number  28    Number of Visits  33    Date for PT Re-Evaluation  07/28/17    Authorization Type  BCBS     PT Start Time  0930    PT Stop Time  1010    PT Time Calculation (min)  40 min    Activity Tolerance  Patient tolerated treatment well    Behavior During Therapy  [facility] for tasks assessed/performed       Past Medical History:  Diagnosis Date  . Cryptogenic stroke ([place]) 05/2015   [name] 05/27/2015; left hand weaker since (05/26/2015)  . PFO (patent foramen ovale)    a. 05/27/2015: s/p PFO closure w/ 64mm PFO occluder   . Sickle cell trait [facility])     Past Surgical History:  Procedure Laterality Date  . ASD AND VSD REPAIR  05/27/2015   [name] 05/27/2015  . CARDIAC CATHETERIZATION N/A 05/27/2015   Procedure: ASD/VSD Closure;  Surgeon: Sherren Mocha, MD;  Location: [place] CV LAB;  Service: Cardiovascular;  Laterality: N/A;  . TEE WITHOUT CARDIOVERSION N/A 04/21/2015   Procedure: TRANSESOPHAGEAL ECHOCARDIOGRAM (TEE);  Surgeon: Sueanne Margarita, MD;  Location: [facility] ENDOSCOPY;  Service: Cardiovascular;  Laterality: N/A;    There were no vitals filed for this visit.  Subjective Assessment - 07/12/17 1540    Subjective  Patient reports his shoulder is sore at times but overall he is able to use it at work. He is thinking about joining a gym     Limitations  House hold activities;Lifting    Patient Stated Goals  Riding his bike     Currently in Pain?  No/denies                       [facility] Adult PT Treatment/Exercise - 07/12/17 0001      Shoulder Exercises: Prone   Extension  20 reps    Extension  Limitations  3 pounds    Other Prone Exercises  row with 5 pounds x 15, then extension x 15 5 pounds      Shoulder Exercises: Standing   External Rotation  20 reps    Theraband Level (Shoulder External Rotation)  Level 4 (Blue)    Internal Rotation  20 reps    Theraband Level (Shoulder Internal Rotation)  Level 4 (Blue)    Extension  20 reps    Theraband Level (Shoulder Extension)  Level 4 (Blue)    Row  20 reps    Theraband Level (Shoulder Row)  Level 4 (Blue)    Other Standing Exercises  cabinet reach 2x10 3lbs       Shoulder Exercises: ROM/Strengthening   Other ROM/Strengthening Exercises  Tricep pull downs x 3  35 pounds. Then 25 pounds x 5 then 20 pounds x 12             PT Education - 07/12/17 1540    Education provided  Yes    Education Details  reviewed exercises. reviewed gym exercises     Person(s) Educated  Patient    Methods  Explanation;Demonstration;Tactile cues;Verbal cues    Comprehension  Verbalized understanding;Tactile cues  required;Verbal cues required;Need further instruction;Returned demonstration       PT Short Term Goals - 05/19/17 1039      PT SHORT TERM GOAL #1   Title  Patient will increase shoulder flexion by 30 degrees     Baseline  full     Time  4    Period  Weeks    Status  Achieved      PT SHORT TERM GOAL #2   Title  Patient will increase his left passive shoulder ER by 25 degrees     Baseline  full passive ER     Time  4    Period  Weeks    Status  Achieved      PT SHORT TERM GOAL #3   Title  Patient will increase gross left shoulder strength to 3+/5     Baseline  3+/5 ER and flexion     Time  4    Period  Weeks    Status  Achieved      PT SHORT TERM GOAL #4   Title  Patients will independent with HEP     Baseline  performing exercises at home     Time  4    Period  Weeks    Status  Achieved        PT Long Term Goals - 07/06/17 1558      PT LONG TERM GOAL #1   Title  Patient will reach behind his back to L3  without pain     Time  8    Period  Weeks    Status  Unable to assess      PT LONG TERM GOAL #2   Title  Patient will reach behind his head without pain in order to perfrom hygien tasks     Time  8    Period  Weeks    Status  Achieved      PT LONG TERM GOAL #3   Title  Patient will reach overhead to a shelf with a 2lb weight without pain     Baseline  working on this,  difficult ROM for top shelf,  needs to stretch on wall prior.  can stretch    Time  8    Period  Weeks    Status  On-going            Plan - 07/12/17 0935    Clinical Impression Statement  Patient tolerated treatment well. He had no pain with exercises. e reported fatigue. he has one more visit scheduled. He has likley reached max potential with PT. D/C to HEPnext visit. Review exercises for home program.     Clinical Presentation  Stable    Clinical Decision Making  Low    Rehab Potential  Excellent    PT Frequency  2x / week    PT Duration  4 weeks    PT Treatment/Interventions  ADLs/Self Care Home Management;Cryotherapy;Electrical Stimulation;Therapeutic activities;Therapeutic exercise;Neuromuscular re-education;Patient/family education;Manual techniques;Taping;Passive range of motion;Ultrasound;Iontophoresis 4mg /ml Dexamethasone    PT Next Visit Plan  advance flexion and tricp strength with caution. Review cabinet reaching and wall push ups. Work toward regaining functional strength with LUE,, gentle UE weight bearing .  consider more prone exercises; FOTO D/C to HEP     PT Home Exercise Plan  pendulum exer , scapula retraction, bands, wall push ups, cabinet reaching;     Consulted and Agree with Plan of Care  Patient       Patient will  benefit from skilled therapeutic intervention in order to improve the following deficits and impairments:  Pain, Impaired UE functional use, Decreased activity tolerance, Decreased endurance, Decreased range of motion, Decreased strength  Visit Diagnosis: Chronic left  shoulder pain  Stiffness of left shoulder, not elsewhere classified  Muscle weakness (generalized)     Problem List Patient Active Problem List   Diagnosis Date Noted  . Anxiety 08/03/2016  . Cerebrovascular accident (CVA) due to embolism of right middle cerebral artery ([place]) 05/30/2015  . Prediabetes 05/27/2015  . Sickle cell trait ([place]) 05/26/2015  . PFO (patent foramen ovale)   . Acute CVA (cerebrovascular accident) ([place]) 04/20/2015  . Stroke ([place])   . HLD (hyperlipidemia)     Carney Living 07/12/2017, 3:44 PM  [facility] [address] [place], [name], [address] Phone: [phone]   Fax:  [phone]  Name: ABDULKARIM EBERLIN MRN: [id number] Date of Birth: [birth year]

## 2017-07-14 ENCOUNTER — Encounter: Payer: Self-pay | Admitting: Physical Therapy

## 2017-07-14 ENCOUNTER — Ambulatory Visit: Payer: BLUE CROSS/BLUE SHIELD | Admitting: Physical Therapy

## 2017-07-14 DIAGNOSIS — M25512 Pain in left shoulder: Principal | ICD-10-CM

## 2017-07-14 DIAGNOSIS — G8929 Other chronic pain: Secondary | ICD-10-CM

## 2017-07-14 DIAGNOSIS — M6281 Muscle weakness (generalized): Secondary | ICD-10-CM

## 2017-07-14 DIAGNOSIS — M25612 Stiffness of left shoulder, not elsewhere classified: Secondary | ICD-10-CM

## 2017-07-14 NOTE — Therapy (Addendum)
[place], [name], [address] Phone: [phone]   Fax:  [phone]  Physical Therapy Treatment/Discharge   Patient Details  Name: Daniel Schneider MRN: [id number] Date of Birth: 26-Aug-1975 Referring Provider: Dr Clearance Coots    Encounter Date: 07/14/2017  PT End of Session - 07/14/17 1026    Visit Number  29    Number of Visits  33    Date for PT Re-Evaluation  07/28/17    PT Start Time  0932    PT Stop Time  1015    PT Time Calculation (min)  43 min    Activity Tolerance  Patient tolerated treatment well    Behavior During Therapy  [facility] for tasks assessed/performed       Past Medical History:  Diagnosis Date  . Cryptogenic stroke ([place]) 05/2015   [name] 05/27/2015; left hand weaker since (05/26/2015)  . PFO (patent foramen ovale)    a. 05/27/2015: s/p PFO closure w/ 77m PFO occluder   . Sickle cell trait ([facility]     Past Surgical History:  Procedure Laterality Date  . ASD AND VSD REPAIR  05/27/2015   /[name]4/18/2017  . CARDIAC CATHETERIZATION N/A 05/27/2015   Procedure: ASD/VSD Closure;  Surgeon: MSherren Mocha MD;  [place] [place];  Service: Cardiovascular;  Laterality: N/A;  . TEE WITHOUT CARDIOVERSION N/A 04/21/2015   Procedure: TRANSESOPHAGEAL ECHOCARDIOGRAM (TEE);  Surgeon: TSueanne Margarita MD;  [facility]  Service: Cardiovascular;  Laterality: N/A;    There were no vitals filed for this visit.  Subjective Assessment - 07/14/17 0937    Subjective  No pain.                       [facility] Treatment/Exercise - 07/14/17 0001      Shoulder Exercises: Prone   Extension  15 reps    Extension Limitations  5 LBS    Other Prone Exercises  row with 5 pounds x 15, then extension x 15 5 pounds      Shoulder Exercises: Standing   External Rotation  20 reps    Theraband Level (Shoulder External Rotation)  Level 4 (Blue)    Internal Rotation  20 reps     Theraband Level (Shoulder Internal Rotation)  Level 4 (Blue)    Extension  20 reps    Theraband Level (Shoulder Extension)  Level 4 (Blue)    Row  20 reps    Theraband Level (Shoulder Row)  Level 4 (Blue)    Other Standing Exercises  cabinet reach 2x10 3lbs       Shoulder Exercises: ROM/Strengthening   UBE (Upper Arm Bike)  L4  6 minutse with switch direction at 3 minutes 15 seconds.    Cybex Row  20 reps 35 LBS    Other ROM/Strengthening Exercises  tricep pull down 35 X 3 too easy, 20 Lbs  3 sets of 10      Shoulder Exercises: Stretch   Other Shoulder Stretches  wall stretch scaption1 rep 45 seconds             PT Education - 07/14/17 1025    Education provided  Yes    Education Details  importance of keeping up with stretching and HEP.    Person(s) Educated  Patient    Methods  Explanation    Comprehension  Verbalized understanding       PT Short Term Goals - 05/19/17  [place] #1   Title  Patient will increase shoulder flexion by 30 degrees     Baseline  full     Time  4    Period  Weeks    Status  Achieved      PT SHORT TERM GOAL #2   Title  Patient will increase his left passive shoulder ER by 25 degrees     Baseline  full passive ER     Time  4    Period  Weeks    Status  Achieved      PT SHORT TERM GOAL #3   Title  Patient will increase gross left shoulder strength to 3+/5     Baseline  3+/5 ER and flexion     Time  4    Period  Weeks    Status  Achieved      PT SHORT TERM GOAL #4   Title  Patients will independent with HEP     Baseline  performing exercises at home     Time  4    Period  Weeks    Status  Achieved        PT Long Term Goals - 07/14/17 9233      PT LONG TERM GOAL #1   Title  Patient will reach behind his back to L3 without pain     Baseline  able ,  no pain    Time  8    Period  Weeks    Status  Achieved      PT LONG TERM GOAL #2   Title  Patient will reach behind his head without pain in order to  perfrom hygien tasks     Baseline  reaching behind his back without pain     Time  8    Period  Weeks    Status  Achieved      PT LONG TERM GOAL #3   Title  Patient will reach overhead to a shelf with a 2lb weight without pain     Baseline  able to do no pain ,  feels muscles work    Time  8    Period  Weeks    Status  Achieved            Plan - 07/14/17 1026    Clinical Impression Statement  Last visit today all goals met,  Grip average LBS left:  average 56.3,  Right average 144.6 LBS.  FOTO improved from 96% limitation to 32 % limitation.  No pain at end of session.    PT Next Visit Plan  Discharge to HEP    PT Home Exercise Plan  pendulum exer , scapula retraction, bands, wall push ups, cabinet reaching;     Consulted and Agree with Plan of Care  Patient       Patient will benefit from skilled therapeutic intervention in order to improve the following deficits and impairments:     Visit Diagnosis: Chronic left shoulder pain  Stiffness of left shoulder, not elsewhere classified  Muscle weakness (generalized)    PHYSICAL THERAPY DISCHARGE SUMMARY  Visits from Start of Care: 29  Current functional level related to goals / functional outcomes: Significantly improved function of his shoulder    Remaining deficits: Pain at times    Education / Equipment: HEP  Plan: Patient agrees to discharge.  Patient goals were met. Patient is being discharged due to meeting the stated  rehab goals.  ?????      Problem List Patient Active Problem List   Diagnosis Date Noted  . Anxiety 08/03/2016  . Cerebrovascular accident (CVA) due to embolism of right middle cerebral artery ([place]) 05/30/2015  . Prediabetes 05/27/2015  . Sickle cell trait ([place]) 05/26/2015  . PFO (patent foramen ovale)   . Acute CVA (cerebrovascular accident) ([place]) 04/20/2015  . Stroke ([place])   . HLD (hyperlipidemia)     HARRIS,KAREN PTA 07/14/2017, 10:31 AM Carolyne Littles PT DPT  08/09/2017  08:39  [facility] [address] [place], [name], [address] Phone: [phone]   Fax:  [phone]  Name: Daniel Schneider MRN: [id number] Date of Birth: [birth year]

## 2017-07-14 NOTE — Patient Instructions (Signed)
Keep using your arm. Keep up with the HEP.

## 2018-09-30 ENCOUNTER — Other Ambulatory Visit: Payer: Self-pay

## 2018-09-30 ENCOUNTER — Emergency Department (HOSPITAL_COMMUNITY)
Admission: EM | Admit: 2018-09-30 | Discharge: 2018-10-01 | Disposition: A | Discharge status: Home / Self Care | Payer: BLUE CROSS/BLUE SHIELD | Attending: Emergency Medicine | Admitting: Emergency Medicine

## 2018-09-30 DIAGNOSIS — Z7982 Long term (current) use of aspirin: Secondary | ICD-10-CM | POA: Insufficient documentation

## 2018-09-30 DIAGNOSIS — Z202 Contact with and (suspected) exposure to infections with a predominantly sexual mode of transmission: Secondary | ICD-10-CM | POA: Insufficient documentation

## 2018-09-30 DIAGNOSIS — R8281 Pyuria: Secondary | ICD-10-CM | POA: Insufficient documentation

## 2018-09-30 DIAGNOSIS — Z7251 High risk heterosexual behavior: Secondary | ICD-10-CM

## 2018-09-30 DIAGNOSIS — D573 Sickle-cell trait: Secondary | ICD-10-CM | POA: Insufficient documentation

## 2018-09-30 DIAGNOSIS — Z8673 Personal history of transient ischemic attack (TIA), and cerebral infarction without residual deficits: Secondary | ICD-10-CM | POA: Insufficient documentation

## 2018-09-30 LAB — URINALYSIS, ROUTINE W REFLEX MICROSCOPIC
Bacteria, UA: NONE SEEN
Bilirubin Urine: NEGATIVE
Glucose, UA: NEGATIVE mg/dL
Hgb urine dipstick: NEGATIVE
Ketones, ur: NEGATIVE mg/dL
Nitrite: NEGATIVE
Protein, ur: NEGATIVE mg/dL
Specific Gravity, Urine: 1.013 (ref 1.005–1.030)
pH: 6 (ref 5.0–8.0)

## 2018-09-30 MED ORDER — CEFTRIAXONE SODIUM 250 MG IJ SOLR
250.0000 mg | Freq: Once | INTRAMUSCULAR | Status: AC
  Administered 2018-10-01: 01:00:00 250 mg via INTRAMUSCULAR
  Filled 2018-09-30: qty 250

## 2018-09-30 MED ORDER — AZITHROMYCIN 250 MG PO TABS
1000.0000 mg | ORAL_TABLET | Freq: Once | ORAL | Status: AC
  Administered 2018-10-01: 1000 mg via ORAL
  Filled 2018-09-30: qty 4

## 2018-09-30 MED ORDER — SODIUM CHLORIDE 0.9% FLUSH: 3.0000 mL | Freq: Once | INTRAVENOUS | Status: DC

## 2018-09-30 NOTE — ED Notes (Signed)
Fast track pt, see provider's assessment.

## 2018-09-30 NOTE — ED Triage Notes (Signed)
Pt reports whitish penile discharge that started yesterday.  No fevers.

## 2018-09-30 NOTE — Discharge Instructions (Signed)
You have been treated for gonorrhea and chlamydia today. Follow-up with the health department in 48 hours for the results of your STD tests. If you test positive for STDs, notify all sexual partners of their need to be tested and treated as well. Do not engage in sexual intercourse for one week. Use a condom when sexually active.

## 2018-09-30 NOTE — ED Notes (Signed)
RN w/ Provider during exam.

## 2018-10-01 MED ORDER — LIDOCAINE HCL (PF) 1 % IJ SOLN
INTRAMUSCULAR | Status: AC
  Filled 2018-10-01: qty 5

## 2018-10-01 NOTE — ED Provider Notes (Signed)
[facility] EMERGENCY DEPARTMENT Provider Note   CSN: [id number] Arrival date & time: 09/30/18  2317     History   Chief Complaint Chief Complaint  Patient presents with  . Penile Discharge    HPI Daniel Schneider is a 43 y.o. male.     43 year old male presents to the emergency department for evaluation of penile discharge x2 days.  Reports that he was having intercourse with a male partner 3 days ago when the condom came off.  He has noticed a white penile discharge for the past 48 hours with some crusting of the discharge at his urethral meatus.  Reports some sharp twinges of pain prior to voiding.  No genital sores, lesions, fever, nausea, vomiting, hematuria, testicular tenderness, scrotal swelling.  Reports 1 partner in the past 6 months as well as consistent use of condoms when sexually active.  Expresses concern for STDs.  The history is provided by the patient. No language interpreter was used.  Penile Discharge    Past Medical History:  Diagnosis Date  . Cryptogenic stroke ([place]) 05/2015   [name] 05/27/2015; left hand weaker since (05/26/2015)  . PFO (patent foramen ovale)    a. 05/27/2015: s/p PFO closure w/ 43mm PFO occluder   . Sickle cell trait [facility])     Patient Active Problem List   Diagnosis Date Noted  . Anxiety 08/03/2016  . Cerebrovascular accident (CVA) due to embolism of right middle cerebral artery ([place]) 05/30/2015  . Prediabetes 05/27/2015  . Sickle cell trait ([place]) 05/26/2015  . PFO (patent foramen ovale)   . Acute CVA (cerebrovascular accident) ([place]) 04/20/2015  . Stroke ([place])   . HLD (hyperlipidemia)     Past Surgical History:  Procedure Laterality Date  . ASD AND VSD REPAIR  05/27/2015   [name] 05/27/2015  . CARDIAC CATHETERIZATION N/A 05/27/2015   Procedure: ASD/VSD Closure;  Surgeon: Sherren Mocha, MD;  Location: [facility] CV LAB;  Service: Cardiovascular;  Laterality: N/A;  . TEE WITHOUT CARDIOVERSION N/A 04/21/2015   Procedure: TRANSESOPHAGEAL ECHOCARDIOGRAM (TEE);  Surgeon: Sueanne Margarita, MD;  Location: [facility] ENDOSCOPY;  Service: Cardiovascular;  Laterality: N/A;        Home Medications    Prior to Admission medications   Medication Sig Start Date End Date Taking? Authorizing Provider  aspirin 81 MG tablet Take 81 mg by mouth daily.    [provider]  HYDROcodone-acetaminophen (NORCO/VICODIN) 5-325 MG tablet Take 2 tablets by mouth every 4 (four) hours as needed. 09/12/16   [name], FNP    Family History Family History  Problem Relation Age of Onset  . Healthy Mother   . Healthy Father   . Sickle cell anemia Maternal Grandmother   . Heart disease Paternal Grandfather   . Gout Paternal Grandfather     Social History Social History   Tobacco Use  . Smoking status: Never Smoker  . Smokeless tobacco: Never Used  Substance Use Topics  . Alcohol use: No    Alcohol/week: 0.0 standard drinks  . Drug use: No     Allergies   Patient has no known allergies.   Review of Systems Review of Systems  Genitourinary: Positive for discharge.  Ten systems reviewed and are negative for acute change, except as noted in the HPI.    Physical Exam Updated Vital Signs BP 128/87 (BP Location: Right Arm)   Pulse (!) 57   Temp 97.8 F (36.6 C) (Oral)   Resp 18   SpO2  100%   Physical Exam Vitals signs and nursing note reviewed. Exam conducted with a chaperone present.  Constitutional:      General: He is not in acute distress.    Appearance: He is well-developed. He is not diaphoretic.     Comments: Nontoxic appearing and in NAD  HENT:     Head: Normocephalic and atraumatic.  Eyes:     General: No scleral icterus.    Conjunctiva/sclera: Conjunctivae normal.  Neck:     Musculoskeletal: Normal range of motion.  Pulmonary:     Effort: Pulmonary effort is normal. No respiratory distress.     Comments: Respirations even and unlabored Abdominal:     Palpations: Abdomen is  soft.     Tenderness: There is no abdominal tenderness.  Genitourinary:    Penis: Circumcised.      Comments: No tenderness to bilateral testicles.  No scrotal swelling or genital sores or lesions to external genitalia.  No appreciable penile discharge. Musculoskeletal: Normal range of motion.  Lymphadenopathy:     Lower Body: No right inguinal adenopathy. No left inguinal adenopathy.  Skin:    General: Skin is warm and dry.     Coloration: Skin is not pale.     Findings: No erythema or rash.  Neurological:     General: No focal deficit present.     Mental Status: He is alert and oriented to person, place, and time.     Coordination: Coordination normal.  Psychiatric:        Behavior: Behavior normal.      ED Treatments / Results  Labs (all labs ordered are listed, but only abnormal results are displayed) Labs Reviewed  URINALYSIS, ROUTINE W REFLEX MICROSCOPIC - Abnormal; Notable for the following components:      Result Value   Leukocytes,Ua SMALL (*)    All other components within normal limits  GC/CHLAMYDIA PROBE AMP ([place]) NOT AT [facility]    EKG None  Radiology No results found.  Procedures Procedures (including critical care time)  Medications Ordered in ED Medications  cefTRIAXone (ROCEPHIN) injection 250 mg (has no administration in time range)  azithromycin (ZITHROMAX) tablet 1,000 mg (has no administration in time range)     Initial Impression / Assessment and Plan / ED Course  I have reviewed the triage vital signs and the nursing notes.  Pertinent labs & imaging results that were available during my care of the patient were reviewed by me and considered in my medical decision making (see chart for details).        Patient to be discharged with instructions to follow up with the Health Department. Discussed importance of using protection when sexually active. Pt understands that they have GC/Chlamydia cultures pending and that they will need to  inform all sexual partners if results return positive. Pt has been treated prophylacticly with azithromycin and rocephin due to pts history and UA with mild pyuria. Return precautions discussed and provided. Patient discharged in stable condition with no unaddressed concerns.   Final Clinical Impressions(s) / ED Diagnoses   Final diagnoses:  Pyuria  History of unprotected sex    ED Discharge Orders    None       Antonietta Breach, PA-C 10/01/18 0005    Veryl Speak, MD 10/01/18 [phone]

## 2018-10-03 LAB — GC/CHLAMYDIA PROBE AMP (~~LOC~~) NOT AT ARMC
Chlamydia: NEGATIVE
Neisseria Gonorrhea: NEGATIVE

## 2018-10-11 ENCOUNTER — Encounter (HOSPITAL_COMMUNITY): Payer: Self-pay | Admitting: Emergency Medicine

## 2018-10-11 ENCOUNTER — Emergency Department (HOSPITAL_COMMUNITY)
Admission: EM | Admit: 2018-10-11 | Discharge: 2018-10-11 | Disposition: A | Discharge status: Home / Self Care | Payer: Self-pay | Attending: Emergency Medicine | Admitting: Emergency Medicine

## 2018-10-11 ENCOUNTER — Other Ambulatory Visit: Payer: Self-pay

## 2018-10-11 DIAGNOSIS — R369 Urethral discharge, unspecified: Secondary | ICD-10-CM | POA: Insufficient documentation

## 2018-10-11 DIAGNOSIS — Z8673 Personal history of transient ischemic attack (TIA), and cerebral infarction without residual deficits: Secondary | ICD-10-CM | POA: Insufficient documentation

## 2018-10-11 DIAGNOSIS — D573 Sickle-cell trait: Secondary | ICD-10-CM | POA: Insufficient documentation

## 2018-10-11 DIAGNOSIS — Z7982 Long term (current) use of aspirin: Secondary | ICD-10-CM | POA: Insufficient documentation

## 2018-10-11 LAB — URINALYSIS, ROUTINE W REFLEX MICROSCOPIC
Bilirubin Urine: NEGATIVE
Glucose, UA: NEGATIVE mg/dL
Hgb urine dipstick: NEGATIVE
Ketones, ur: NEGATIVE mg/dL
Nitrite: NEGATIVE
Protein, ur: NEGATIVE mg/dL
Specific Gravity, Urine: 1.014 (ref 1.005–1.030)
WBC, UA: >50 WBC/hpf — ABNORMAL HIGH (ref 0–5)
pH: 5 (ref 5.0–8.0)

## 2018-10-11 LAB — WET PREP, GENITAL
Clue Cells Wet Prep HPF POC: NONE SEEN
Sperm: NONE SEEN
Trich, Wet Prep: NONE SEEN
Yeast Wet Prep HPF POC: NONE SEEN

## 2018-10-11 MED ORDER — DOXYCYCLINE HYCLATE 100 MG PO CAPS: 100.0000 mg | ORAL_CAPSULE | Freq: Two times a day (BID) | ORAL | 0 refills | Status: DC

## 2018-10-11 NOTE — ED Notes (Signed)
See EDP assessment. Pt verbalizes understanding of d/c instructions. Prescriptions reviewed with patient. Pt ambulatory at d/c with all belongings.

## 2018-10-11 NOTE — ED Provider Notes (Signed)
[facility] EMERGENCY DEPARTMENT Provider Note   CSN: [id number] Arrival date & time: 10/11/18  1814     History   Chief Complaint Chief Complaint  Patient presents with  . Penile Discharge    HPI Daniel Schneider is a 43 y.o. male.     The history is provided by the patient and medical records. No language interpreter was used.  Penile Discharge   Daniel Schneider is a 43 y.o. male  with a PMH as listed below who presents to the Emergency Department complaining of resistant penile discharge.  Seen in the emergency department on 8/22 for same at onset of symptoms.  He was treated prophylactically with azithromycin and Rocephin.  His gonorrhea and Chlamydia testing came back negative.  He states that he never had any improvement in his symptoms.  No worsening of symptoms.  Denies any abdominal pain, nausea or vomiting.  No dysuria.  No pain to the scrotum or scrotal swelling.  No GU lesions.  No pain with bowel movements.  His significant other did have her annual OB/GYN appointment where she had all testing and was reportedly negative, however was treated for a UTI.   Past Medical History:  Diagnosis Date  . Cryptogenic stroke ([place]) 05/2015   [name] 05/27/2015; left hand weaker since (05/26/2015)  . PFO (patent foramen ovale)    a. 05/27/2015: s/p PFO closure w/ 8mm PFO occluder   . Sickle cell trait [facility])     Patient Active Problem List   Diagnosis Date Noted  . Anxiety 08/03/2016  . Cerebrovascular accident (CVA) due to embolism of right middle cerebral artery ([place]) 05/30/2015  . Prediabetes 05/27/2015  . Sickle cell trait ([place]) 05/26/2015  . PFO (patent foramen ovale)   . Acute CVA (cerebrovascular accident) ([place]) 04/20/2015  . Stroke ([place])   . HLD (hyperlipidemia)     Past Surgical History:  Procedure Laterality Date  . ASD AND VSD REPAIR  05/27/2015   [name] 05/27/2015  . CARDIAC CATHETERIZATION N/A 05/27/2015   Procedure: ASD/VSD Closure;   Surgeon: Sherren Mocha, MD;  Location: [facility] CV LAB;  Service: Cardiovascular;  Laterality: N/A;  . TEE WITHOUT CARDIOVERSION N/A 04/21/2015   Procedure: TRANSESOPHAGEAL ECHOCARDIOGRAM (TEE);  Surgeon: Sueanne Margarita, MD;  Location: [facility] ENDOSCOPY;  Service: Cardiovascular;  Laterality: N/A;        Home Medications    Prior to Admission medications   Medication Sig Start Date End Date Taking? Authorizing Provider  aspirin 81 MG tablet Take 81 mg by mouth daily.    [provider]  doxycycline (VIBRAMYCIN) 100 MG capsule Take 1 capsule (100 mg total) by mouth 2 (two) times daily. 10/11/18   , Ozella Almond, PA-C  HYDROcodone-acetaminophen (NORCO/VICODIN) 5-325 MG tablet Take 2 tablets by mouth every 4 (four) hours as needed. 09/12/16   [name], FNP    Family History Family History  Problem Relation Age of Onset  . Healthy Mother   . Healthy Father   . Sickle cell anemia Maternal Grandmother   . Heart disease Paternal Grandfather   . Gout Paternal Grandfather     Social History Social History   Tobacco Use  . Smoking status: Never Smoker  . Smokeless tobacco: Never Used  Substance Use Topics  . Alcohol use: No    Alcohol/week: 0.0 standard drinks  . Drug use: No     Allergies   Patient has no known allergies.   Review of Systems Review of  Systems  Genitourinary: Positive for discharge. Negative for dysuria.  All other systems reviewed and are negative.    Physical Exam Updated Vital Signs BP 123/71 (BP Location: Left Arm)   Pulse 64   Temp 99 F (37.2 C) (Oral)   Resp 18   SpO2 100%   Physical Exam Vitals signs and nursing note reviewed.  Constitutional:      General: He is not in acute distress.    Appearance: He is well-developed.  HENT:     Head: Normocephalic and atraumatic.  Neck:     Musculoskeletal: Neck supple.  Cardiovascular:     Rate and Rhythm: Normal rate and regular rhythm.     Heart sounds: Normal heart sounds.  No murmur.  Pulmonary:     Effort: Pulmonary effort is normal. No respiratory distress.     Breath sounds: Normal breath sounds.  Abdominal:     General: There is no distension.     Palpations: Abdomen is soft.     Comments: No abdominal tenderness.   Genitourinary:    Comments: Chaperone present for exam. + discharge from penis. No signs of lesion or erythema on the penis or testicles. The penis and testicles are nontender. No testicular masses or swelling. No signs of any inguinal hernias. Cremaster reflex present bilaterally. Skin:    General: Skin is warm and dry.  Neurological:     Mental Status: He is alert and oriented to person, place, and time.      ED Treatments / Results  Labs (all labs ordered are listed, but only abnormal results are displayed) Labs Reviewed  URINALYSIS, ROUTINE W REFLEX MICROSCOPIC - Abnormal; Notable for the following components:      Result Value   Leukocytes,Ua LARGE (*)    WBC, UA >50 (*)    Bacteria, UA RARE (*)    All other components within normal limits  WET PREP, GENITAL  GC/CHLAMYDIA PROBE AMP ([place]) NOT AT [facility]    EKG None  Radiology No results found.  Procedures Procedures (including critical care time)  Medications Ordered in ED Medications - No data to display   Initial Impression / Assessment and Plan / ED Course  I have reviewed the triage vital signs and the nursing notes.  Pertinent labs & imaging results that were available during my care of the patient were reviewed by me and considered in my medical decision making (see chart for details).       Daniel Schneider is a 43 y.o. male who presents to ED for persistent penile discharge despite prophylactic gonorrhea and Chlamydia treatment when seen in the ER on 8/22.  He reports no intercourse in that timeframe either.  Significant other got tested and was reportedly negative.  No nominal tenderness on exam.  No scrotal tenderness or swelling.  Does have  discharge noted.  Repeat gonorrhea and chlamydia swabs sent.  Will treat for nongonococcal urethritis with doxycycline for 1 week.  Reasons to return to the emergency department were discussed and all questions answered.   Final Clinical Impressions(s) / ED Diagnoses   Final diagnoses:  Penile discharge    ED Discharge Orders         Ordered    doxycycline (VIBRAMYCIN) 100 MG capsule  2 times daily     10/11/18 2241           , Ozella Almond, PA-C 10/11/18 2300    Lajean Saver, MD 10/12/18 1442

## 2018-10-11 NOTE — Discharge Instructions (Signed)
It was my pleasure taking care of you today!   Please take all of your antibiotics until finished!  Return to ER for new or worsening symptoms, any additional concerns.

## 2018-10-11 NOTE — ED Triage Notes (Signed)
Pt presents to ED with complaints of on going white penile discharge. Pt was seen here on 8/22 for same and was treated with antibiotics. Patient states that it has not worked. Tested negative for gonorrhea and chlamydia.

## 2018-10-13 LAB — GC/CHLAMYDIA PROBE AMP (~~LOC~~) NOT AT ARMC
Chlamydia: NEGATIVE
Neisseria Gonorrhea: NEGATIVE

## 2019-11-16 ENCOUNTER — Encounter (HOSPITAL_COMMUNITY): Payer: Self-pay

## 2019-11-16 ENCOUNTER — Other Ambulatory Visit: Payer: Self-pay

## 2019-11-16 ENCOUNTER — Ambulatory Visit
Admission: EM | Admit: 2019-11-16 | Discharge: 2019-11-16 | Disposition: A | Discharge status: Home / Self Care | Payer: Self-pay

## 2019-11-16 ENCOUNTER — Emergency Department (HOSPITAL_COMMUNITY)
Admission: EM | Admit: 2019-11-16 | Discharge: 2019-11-16 | Disposition: A | Discharge status: Home / Self Care | Payer: 59 | Attending: Emergency Medicine | Admitting: Emergency Medicine

## 2019-11-16 DIAGNOSIS — Y92410 Unspecified street and highway as the place of occurrence of the external cause: Secondary | ICD-10-CM | POA: Insufficient documentation

## 2019-11-16 DIAGNOSIS — Z7982 Long term (current) use of aspirin: Secondary | ICD-10-CM | POA: Diagnosis not present

## 2019-11-16 DIAGNOSIS — M542 Cervicalgia: Secondary | ICD-10-CM | POA: Insufficient documentation

## 2019-11-16 DIAGNOSIS — M545 Low back pain, unspecified: Secondary | ICD-10-CM | POA: Insufficient documentation

## 2019-11-16 DIAGNOSIS — R519 Headache, unspecified: Secondary | ICD-10-CM | POA: Diagnosis present

## 2019-11-16 DIAGNOSIS — M791 Myalgia, unspecified site: Secondary | ICD-10-CM | POA: Insufficient documentation

## 2019-11-16 DIAGNOSIS — M7918 Myalgia, other site: Secondary | ICD-10-CM

## 2019-11-16 MED ORDER — METHOCARBAMOL 500 MG PO TABS: 500.0000 mg | ORAL_TABLET | Freq: Two times a day (BID) | ORAL | 0 refills | Status: DC
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
## 2019-11-16 NOTE — Discharge Instructions (Signed)

## 2020-02-11 ENCOUNTER — Other Ambulatory Visit: Payer: Self-pay

## 2020-02-15 ENCOUNTER — Encounter (HOSPITAL_COMMUNITY): Payer: Self-pay

## 2020-02-22 ENCOUNTER — Ambulatory Visit (INDEPENDENT_AMBULATORY_CARE_PROVIDER_SITE_OTHER): Payer: 59

## 2020-02-22 ENCOUNTER — Other Ambulatory Visit: Payer: Self-pay

## 2020-02-22 ENCOUNTER — Ambulatory Visit (INDEPENDENT_AMBULATORY_CARE_PROVIDER_SITE_OTHER): Payer: 59 | Admitting: Orthopedic Surgery

## 2020-02-22 DIAGNOSIS — M25561 Pain in right knee: Secondary | ICD-10-CM

## 2020-02-23 ENCOUNTER — Encounter: Payer: Self-pay | Admitting: Orthopedic Surgery

## 2020-02-23 NOTE — Progress Notes (Signed)
Office Visit Note   Patient: Daniel Schneider           Date of Birth: Jan 03, 1976           MRN: [id number] Visit Date: 02/22/2020 Requested by: No referring provider defined for this encounter. PCP: Patient, No Pcp Per  Subjective: Chief Complaint  Patient presents with  . Right Knee - Pain    HPI: Daniel Schneider is a 45 year old patient with right knee pain.  Injured it several years ago.  Reports worsening pain coming primarily from a small 4 x 5 mm not in the superficial dermal region around the patella.  States that it is tender to touch.  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  Whenever he does any type of laying down work or work on his knees this becomes symptomatic and somewhat debilitating.  He is otherwise healthy.              ROS: All systems reviewed are negative as they relate to the chief complaint within the history of present illness.  Patient denies  fevers or chills.   Assessment & Plan: Visit Diagnoses:  1. Right knee pain, unspecified chronicity     Plan: Impression is subdermal small mobile mass with low concern for malignancy.  This is freely mobile beneath the skin but is in a location which is potentially aggravating both the patellar periosteum as well as potential traversing superficial dermal nerve.  Does not feel like a neuroma.  Has negative Tinel's.  Unlikely that this is a loose body based on radiographs but it is possible that it could be an enclosed inflammatory reaction which is sealed off of foreign body.  Nonetheless the patient would like it removed which I think would be achievable.  Anticipate about a 1 to 2 cm incision with specimen removal and likely a period of 4 to 6 weeks of not being able to put direct pressure on that knee.  Risk benefits are discussed.  All questions answered.  Patient would like to proceed which I think is reasonable.  Follow-Up Instructions: No follow-ups on file.   Orders:  Orders Placed This  Encounter  Procedures  . XR KNEE 3 VIEW RIGHT   No orders of the defined types were placed in this encounter.     Procedures: No procedures performed   Clinical Data: No additional findings.  Objective: Vital Signs: There were no vitals taken for this visit.  Physical Exam:   Constitutional: Patient appears well-developed HEENT:  Head: Normocephalic Eyes:EOM are normal Neck: Normal range of motion Cardiovascular: Normal rate Pulmonary/chest: Effort normal Neurologic: Patient is alert Skin: Skin is warm Psychiatric: Patient has normal mood and affect    Ortho Exam: Ortho exam demonstrates full active and passive range of motion of the right knee.  No proximal lymphadenopathy.  No effusion.  Collateral crucial ligaments are stable.  Right around the inferior medial pole of the patella there is a freely mobile subdermal nodular mass 5 x 5 mm which has negative Tinel's.  Tender to palpation and when he is on his knees this is definitely right in the location where it would be compressed.  No overlying skin abnormality.  Specialty Comments:  No specialty comments available.  Imaging: No results found.   PMFS History: Patient Active Problem List   Diagnosis Date Noted  . Anxiety 08/03/2016  . Cerebrovascular accident (CVA) due to embolism of right middle cerebral artery ([place]) 05/30/2015  . Prediabetes 05/27/2015  .  Sickle cell trait ([place]) 05/26/2015  . PFO (patent foramen ovale)   . Acute CVA (cerebrovascular accident) ([place]) 04/20/2015  . Stroke ([place])   . HLD (hyperlipidemia)    Past Medical History:  Diagnosis Date  . 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 PFO (patent foramen ovale)    a. 05/27/2015: s/p PFO closure w/ 59mm PFO occluder   . Sickle cell trait (HCC)     Family History  Problem Relation Age of Onset  . Healthy Mother   . Healthy Father   . Sickle cell anemia Maternal Grandmother   . Heart  disease Paternal Grandfather   . Gout Paternal Grandfather     Past Surgical History:  Procedure Laterality Date  . ASD AND VSD REPAIR  05/27/2015   [name] 05/27/2015  . CARDIAC CATHETERIZATION N/A 05/27/2015   Procedure: ASD/VSD Closure;  Surgeon: Sherren Mocha, MD;  Location: [facility];  Service: Cardiovascular;  Laterality: N/A;  . TEE WITHOUT CARDIOVERSION N/A 04/21/2015   Procedure: TRANSESOPHAGEAL ECHOCARDIOGRAM (TEE);  Surgeon: Sueanne Margarita, MD;  Location: [facility] ENDOSCOPY;  Service: Cardiovascular;  Laterality: N/A;   Social History   Occupational History    Employer: OTHER    Comment: Self-Emplyed  Tobacco Use  . Smoking status: Never Smoker  . Smokeless tobacco: Never Used  Substance and Sexual Activity  . Alcohol use: No    Alcohol/week: 0.0 standard drinks  . Drug use: No  . Sexual activity: Not Currently

## 2020-03-17 ENCOUNTER — Other Ambulatory Visit: Payer: Self-pay

## 2020-03-21 ENCOUNTER — Other Ambulatory Visit (HOSPITAL_COMMUNITY)
Admission: RE | Admit: 2020-03-21 | Discharge: 2020-03-21 | Disposition: A | Discharge status: Home / Self Care | Payer: 59 | Source: Ambulatory Visit | Attending: Orthopedic Surgery | Admitting: Orthopedic Surgery

## 2020-03-21 DIAGNOSIS — Z20822 Contact with and (suspected) exposure to covid-19: Secondary | ICD-10-CM | POA: Diagnosis not present

## 2020-03-21 DIAGNOSIS — Z01812 Encounter for preprocedural laboratory examination: Secondary | ICD-10-CM | POA: Diagnosis present

## 2020-03-21 LAB — SARS CORONAVIRUS 2 (TAT 6-24 HRS): SARS Coronavirus 2: NEGATIVE

## 2020-03-24 ENCOUNTER — Encounter (HOSPITAL_COMMUNITY): Payer: Self-pay | Admitting: Orthopedic Surgery

## 2020-03-24 NOTE — Progress Notes (Signed)
Spoke with pt for pre-op call. Pt has a hx of a cryptogenic stroke in 2017 which led them to find a PFO. Pt has had the PFO closed and he states he has not had any other cardiac history. Pt denies HTN or Diabetes.  Covid test done 03/21/20 and it's negative.  Pt states he's been in quarantine since the test was done and understands that he stays in quarantine until he comes to the hospital tomorrow.

## 2020-03-25 ENCOUNTER — Encounter (HOSPITAL_COMMUNITY): Payer: Self-pay | Admitting: Orthopedic Surgery

## 2020-03-25 ENCOUNTER — Ambulatory Visit (HOSPITAL_COMMUNITY): Payer: 59 | Admitting: Anesthesiology

## 2020-03-25 ENCOUNTER — Encounter (HOSPITAL_COMMUNITY)
Admission: RE | Disposition: A | Discharge status: Home / Self Care | Payer: Self-pay | Source: Home / Self Care | Attending: Orthopedic Surgery

## 2020-03-25 ENCOUNTER — Ambulatory Visit (HOSPITAL_COMMUNITY)
Admission: RE | Admit: 2020-03-25 | Discharge: 2020-03-25 | Disposition: A | Discharge status: Home / Self Care | Payer: 59 | Attending: Orthopedic Surgery | Admitting: Orthopedic Surgery

## 2020-03-25 DIAGNOSIS — Z8261 Family history of arthritis: Secondary | ICD-10-CM | POA: Diagnosis not present

## 2020-03-25 DIAGNOSIS — D573 Sickle-cell trait: Secondary | ICD-10-CM | POA: Insufficient documentation

## 2020-03-25 DIAGNOSIS — M25861 Other specified joint disorders, right knee: Secondary | ICD-10-CM

## 2020-03-25 DIAGNOSIS — Z832 Family history of diseases of the blood and blood-forming organs and certain disorders involving the immune mechanism: Secondary | ICD-10-CM | POA: Insufficient documentation

## 2020-03-25 DIAGNOSIS — I69354 Hemiplegia and hemiparesis following cerebral infarction affecting left non-dominant side: Secondary | ICD-10-CM | POA: Diagnosis not present

## 2020-03-25 DIAGNOSIS — Z8249 Family history of ischemic heart disease and other diseases of the circulatory system: Secondary | ICD-10-CM | POA: Insufficient documentation

## 2020-03-25 DIAGNOSIS — Z8774 Personal history of (corrected) congenital malformations of heart and circulatory system: Secondary | ICD-10-CM | POA: Diagnosis not present

## 2020-03-25 DIAGNOSIS — D2121 Benign neoplasm of connective and other soft tissue of right lower limb, including hip: Secondary | ICD-10-CM | POA: Insufficient documentation

## 2020-03-25 HISTORY — PX: EAR CYST EXCISION: SHX22

## 2020-03-25 LAB — BASIC METABOLIC PANEL
Anion gap: 9 (ref 5–15)
BUN: 17 mg/dL (ref 6–20)
CO2: 26 mmol/L (ref 22–32)
Calcium: 9 mg/dL (ref 8.9–10.3)
Chloride: 103 mmol/L (ref 98–111)
Creatinine, Ser: 1.38 mg/dL — ABNORMAL HIGH (ref 0.61–1.24)
GFR, Estimated: >60 mL/min (ref >60)
Glucose, Bld: 100 mg/dL — ABNORMAL HIGH (ref 70–99)
Potassium: 4 mmol/L (ref 3.5–5.1)
Sodium: 138 mmol/L (ref 135–145)

## 2020-03-25 LAB — CBC
HCT: 42.5 % (ref 39.0–52.0)
Hemoglobin: 14 g/dL (ref 13.0–17.0)
MCH: 28 pg (ref 26.0–34.0)
MCHC: 32.9 g/dL (ref 30.0–36.0)
MCV: 85 fL (ref 80.0–100.0)
Platelets: 315 10*3/uL (ref 150–400)
RBC: 5 MIL/uL (ref 4.22–5.81)
RDW: 14.3 % (ref 11.5–15.5)
WBC: 5.4 10*3/uL (ref 4.0–10.5)
nRBC: 0 % (ref 0.0–0.2)

## 2020-03-25 SURGERY — EXCISION, SYNOVIAL CYST, POPLITEAL SPACE
Anesthesia: Monitor Anesthesia Care | Site: Knee | Laterality: Right

## 2020-03-25 MED ORDER — PROPOFOL 10 MG/ML IV BOLUS
INTRAVENOUS | Status: AC
  Filled 2020-03-25: qty 20

## 2020-03-25 MED ORDER — MIDAZOLAM HCL 2 MG/2ML IJ SOLN
INTRAMUSCULAR | Status: AC
  Administered 2020-03-25: 1 mg via INTRAVENOUS
  Filled 2020-03-25: qty 2

## 2020-03-25 MED ORDER — LIDOCAINE 2% (20 MG/ML) 5 ML SYRINGE
INTRAMUSCULAR | Status: DC | PRN
  Administered 2020-03-25: 20 mg via INTRAVENOUS

## 2020-03-25 MED ORDER — GABAPENTIN 300 MG PO CAPS: 300.0000 mg | ORAL_CAPSULE | Freq: Three times a day (TID) | ORAL | 0 refills | Status: AC

## 2020-03-25 MED ORDER — PROPOFOL 10 MG/ML IV BOLUS
INTRAVENOUS | Status: DC | PRN
  Administered 2020-03-25: 50 mg via INTRAVENOUS

## 2020-03-25 MED ORDER — POVIDONE-IODINE 7.5 % EX SOLN
Freq: Once | CUTANEOUS | Status: DC
  Filled 2020-03-25 (×2): qty 118

## 2020-03-25 MED ORDER — CEFAZOLIN SODIUM-DEXTROSE 2-4 GM/100ML-% IV SOLN
2.0000 g | INTRAVENOUS | Status: AC
  Administered 2020-03-25: 2 g via INTRAVENOUS

## 2020-03-25 MED ORDER — FENTANYL CITRATE (PF) 100 MCG/2ML IJ SOLN
50.0000 ug | Freq: Once | INTRAMUSCULAR | Status: AC
  Filled 2020-03-25: qty 1

## 2020-03-25 MED ORDER — KETOROLAC TROMETHAMINE 10 MG PO TABS: 10.0000 mg | ORAL_TABLET | Freq: Three times a day (TID) | ORAL | 0 refills | Status: AC | PRN

## 2020-03-25 MED ORDER — ASPIRIN 81 MG PO CHEW: 81.0000 mg | CHEWABLE_TABLET | Freq: Every day | ORAL | 0 refills | Status: AC

## 2020-03-25 MED ORDER — LACTATED RINGERS IV SOLN: INTRAVENOUS | Status: DC

## 2020-03-25 MED ORDER — ONDANSETRON HCL 4 MG/2ML IJ SOLN
INTRAMUSCULAR | Status: AC
  Filled 2020-03-25: qty 2

## 2020-03-25 MED ORDER — PROPOFOL 500 MG/50ML IV EMUL
INTRAVENOUS | Status: DC | PRN
  Administered 2020-03-25: 80 ug/kg/min via INTRAVENOUS

## 2020-03-25 MED ORDER — MORPHINE SULFATE (PF) 4 MG/ML IV SOLN
INTRAVENOUS | Status: AC
  Filled 2020-03-25: qty 2

## 2020-03-25 MED ORDER — OXYCODONE HCL 5 MG PO TABS: 5.0000 mg | ORAL_TABLET | Freq: Once | ORAL | Status: DC | PRN

## 2020-03-25 MED ORDER — ACETAMINOPHEN 500 MG PO TABS
ORAL_TABLET | ORAL | Status: AC
  Administered 2020-03-25: 1000 mg via ORAL
  Filled 2020-03-25: qty 2

## 2020-03-25 MED ORDER — CLONIDINE HCL (ANALGESIA) 100 MCG/ML EP SOLN
EPIDURAL | Status: DC | PRN
  Administered 2020-03-25: 100 ug

## 2020-03-25 MED ORDER — OXYCODONE HCL 5 MG/5ML PO SOLN: 5.0000 mg | Freq: Once | ORAL | Status: DC | PRN

## 2020-03-25 MED ORDER — CHLORHEXIDINE GLUCONATE 0.12 % MT SOLN: 15.0000 mL | Freq: Once | OROMUCOSAL | Status: AC

## 2020-03-25 MED ORDER — POVIDONE-IODINE 10 % EX SWAB
2.0000 "application " | Freq: Once | CUTANEOUS | Status: AC
  Administered 2020-03-25: 2 via TOPICAL

## 2020-03-25 MED ORDER — ACETAMINOPHEN 500 MG PO TABS: 1000.0000 mg | ORAL_TABLET | Freq: Once | ORAL | Status: AC

## 2020-03-25 MED ORDER — FENTANYL CITRATE (PF) 100 MCG/2ML IJ SOLN
INTRAMUSCULAR | Status: AC
  Administered 2020-03-25: 50 ug via INTRAVENOUS
  Filled 2020-03-25: qty 2

## 2020-03-25 MED ORDER — FENTANYL CITRATE (PF) 100 MCG/2ML IJ SOLN
25.0000 ug | INTRAMUSCULAR | Status: DC | PRN
Start: 2020-03-25 — End: 2020-03-25

## 2020-03-25 MED ORDER — BUPIVACAINE HCL 0.25 % IJ SOLN
INTRAMUSCULAR | Status: DC | PRN
  Administered 2020-03-25: 30 mL

## 2020-03-25 MED ORDER — PROMETHAZINE HCL 25 MG/ML IJ SOLN: 6.2500 mg | INTRAMUSCULAR | Status: DC | PRN

## 2020-03-25 MED ORDER — MORPHINE SULFATE (PF) 4 MG/ML IV SOLN
INTRAVENOUS | Status: DC | PRN
  Administered 2020-03-25: 8 mg via SUBCUTANEOUS

## 2020-03-25 MED ORDER — DEXAMETHASONE SODIUM PHOSPHATE 10 MG/ML IJ SOLN
INTRAMUSCULAR | Status: AC
  Filled 2020-03-25: qty 1

## 2020-03-25 MED ORDER — CHLORHEXIDINE GLUCONATE 0.12 % MT SOLN
OROMUCOSAL | Status: AC
  Administered 2020-03-25: 15 mL via OROMUCOSAL
  Filled 2020-03-25: qty 15

## 2020-03-25 MED ORDER — BUPIVACAINE HCL (PF) 0.25 % IJ SOLN
INTRAMUSCULAR | Status: AC
  Filled 2020-03-25: qty 30

## 2020-03-25 MED ORDER — OXYCODONE-ACETAMINOPHEN 5-325 MG PO TABS
1.0000 | ORAL_TABLET | Freq: Four times a day (QID) | ORAL | 0 refills | Status: AC | PRN
Start: 2020-03-25 — End: 2021-03-25

## 2020-03-25 MED ORDER — CEFAZOLIN SODIUM-DEXTROSE 2-4 GM/100ML-% IV SOLN
INTRAVENOUS | Status: AC
  Filled 2020-03-25: qty 100

## 2020-03-25 MED ORDER — CLONIDINE HCL (ANALGESIA) 100 MCG/ML EP SOLN
EPIDURAL | Status: AC
  Filled 2020-03-25: qty 10

## 2020-03-25 MED ORDER — ORAL CARE MOUTH RINSE: 15.0000 mL | Freq: Once | OROMUCOSAL | Status: AC

## 2020-03-25 MED ORDER — BUPIVACAINE-EPINEPHRINE (PF) 0.5% -1:200000 IJ SOLN
INTRAMUSCULAR | Status: DC | PRN
  Administered 2020-03-25: 15 mL

## 2020-03-25 MED ORDER — MIDAZOLAM HCL 2 MG/2ML IJ SOLN
1.0000 mg | Freq: Once | INTRAMUSCULAR | Status: AC
  Filled 2020-03-25: qty 1

## 2020-03-25 MED ORDER — LIDOCAINE 2% (20 MG/ML) 5 ML SYRINGE
INTRAMUSCULAR | Status: AC
  Filled 2020-03-25: qty 5

## 2020-03-25 MED ORDER — FENTANYL CITRATE (PF) 250 MCG/5ML IJ SOLN
INTRAMUSCULAR | Status: AC
  Filled 2020-03-25: qty 5

## 2020-03-25 SURGICAL SUPPLY — 54 items
BENZOIN TINCTURE PRP APPL 2/3 (GAUZE/BANDAGES/DRESSINGS) IMPLANT
BLADE CLIPPER SURG (BLADE) IMPLANT
BNDG COHESIVE 1X5 TAN STRL LF (GAUZE/BANDAGES/DRESSINGS) IMPLANT
BNDG CONFORM 3 STRL LF (GAUZE/BANDAGES/DRESSINGS) IMPLANT
BNDG ELASTIC 2X5.8 VLCR STR LF (GAUZE/BANDAGES/DRESSINGS) IMPLANT
BNDG ELASTIC 4X5.8 VLCR STR LF (GAUZE/BANDAGES/DRESSINGS) IMPLANT
BNDG ESMARK 4X9 LF (GAUZE/BANDAGES/DRESSINGS) IMPLANT
CORD BIPOLAR FORCEPS 12FT (ELECTRODE) ×2 IMPLANT
COVER SURGICAL LIGHT HANDLE (MISCELLANEOUS) ×2 IMPLANT
COVER WAND RF STERILE (DRAPES) IMPLANT
CUFF TOURN SGL QUICK 18X4 (TOURNIQUET CUFF) IMPLANT
CUFF TOURN SGL QUICK 24 (TOURNIQUET CUFF)
CUFF TRNQT CYL 24X4X16.5-23 (TOURNIQUET CUFF) IMPLANT
DRAPE INCISE IOBAN 66X45 STRL (DRAPES) IMPLANT
DRAPE OEC MINIVIEW 54X84 (DRAPES) IMPLANT
DRAPE U-SHAPE 47X51 STRL (DRAPES) IMPLANT
DRSG EMULSION OIL 3X3 NADH (GAUZE/BANDAGES/DRESSINGS) IMPLANT
DURAPREP 26ML APPLICATOR (WOUND CARE) ×2 IMPLANT
ELECT REM PT RETURN 9FT ADLT (ELECTROSURGICAL)
ELECTRODE REM PT RTRN 9FT ADLT (ELECTROSURGICAL) IMPLANT
GAUZE SPONGE 2X2 8PLY STRL LF (GAUZE/BANDAGES/DRESSINGS) IMPLANT
GAUZE SPONGE 4X4 12PLY STRL (GAUZE/BANDAGES/DRESSINGS) IMPLANT
GAUZE XEROFORM 1X8 LF (GAUZE/BANDAGES/DRESSINGS) ×2 IMPLANT
GLOVE ECLIPSE 8.0 STRL XLNG CF (GLOVE) ×2 IMPLANT
GLOVE SRG 8 PF TXTR STRL LF DI (GLOVE) ×1 IMPLANT
GLOVE SURG UNDER POLY LF SZ8 (GLOVE) ×2
GOWN STRL REUS W/ TWL LRG LVL3 (GOWN DISPOSABLE) ×2 IMPLANT
GOWN STRL REUS W/TWL LRG LVL3 (GOWN DISPOSABLE) ×2
IMMOBILIZER KNEE 19 UNV (ORTHOPEDIC SUPPLIES) ×2 IMPLANT
KIT BASIN OR (CUSTOM PROCEDURE TRAY) ×2 IMPLANT
KIT TURNOVER KIT B (KITS) ×2 IMPLANT
MANIFOLD NEPTUNE II (INSTRUMENTS) ×2 IMPLANT
NS IRRIG 1000ML POUR BTL (IV SOLUTION) ×2 IMPLANT
PACK ORTHO EXTREMITY (CUSTOM PROCEDURE TRAY) ×2 IMPLANT
PAD ARMBOARD 7.5X6 YLW CONV (MISCELLANEOUS) ×4 IMPLANT
PENCIL BUTTON HOLSTER BLD 10FT (ELECTRODE) IMPLANT
SPECIMEN JAR SMALL (MISCELLANEOUS) ×2 IMPLANT
SPONGE GAUZE 2X2 STER 10/PKG (GAUZE/BANDAGES/DRESSINGS)
STRIP CLOSURE SKIN 1/2X4 (GAUZE/BANDAGES/DRESSINGS) ×2 IMPLANT
SUCTION FRAZIER HANDLE 10FR (MISCELLANEOUS)
SUCTION TUBE FRAZIER 10FR DISP (MISCELLANEOUS) IMPLANT
SUT ETHIBOND 4 0 TF (SUTURE) IMPLANT
SUT ETHIBOND 5 0 P 3 (SUTURE)
SUT ETHILON 4 0 P 3 18 (SUTURE) IMPLANT
SUT ETHILON 5 0 P 3 18 (SUTURE)
SUT NYLON ETHILON 5-0 P-3 1X18 (SUTURE) IMPLANT
SUT POLY ETHIBOND 5-0 P-3 1X18 (SUTURE) IMPLANT
SUT PROLENE 4 0 P 3 18 (SUTURE) IMPLANT
SUT SILK 4 0 PS 2 (SUTURE) IMPLANT
SUT VIC AB 3-0 FS2 27 (SUTURE) IMPLANT
TOWEL GREEN STERILE (TOWEL DISPOSABLE) ×2 IMPLANT
TOWEL GREEN STERILE FF (TOWEL DISPOSABLE) ×2 IMPLANT
TUBE CONNECTING 12X1/4 (SUCTIONS) IMPLANT
WATER STERILE IRR 1000ML POUR (IV SOLUTION) ×2 IMPLANT

## 2020-03-25 NOTE — Anesthesia Preprocedure Evaluation (Addendum)
Anesthesia Evaluation  Patient identified by MRN, date of birth, ID band Patient awake    Reviewed: Allergy & Precautions, NPO status , Patient's Chart, lab work & pertinent test results  History of Anesthesia Complications Negative for: history of anesthetic complications  Airway Mallampati: II  TM Distance: >3 FB Neck ROM: Full    Dental no notable dental hx.    Pulmonary neg pulmonary ROS,    Pulmonary exam normal        Cardiovascular negative cardio ROS Normal cardiovascular exam     Neuro/Psych Anxiety CVA (2017, now s/p PFO closure)    GI/Hepatic negative GI ROS, Neg liver ROS,   Endo/Other  negative endocrine ROS  Renal/GU negative Renal ROS  negative genitourinary   Musculoskeletal negative musculoskeletal ROS (+)   Abdominal   Peds  Hematology negative hematology ROS (+)   Anesthesia Other Findings Day of surgery medications reviewed with patient.  Reproductive/Obstetrics negative OB ROS                            Anesthesia Physical Anesthesia Plan  ASA: III  Anesthesia Plan: MAC   Post-op Pain Management:  Regional for Post-op pain   Induction:   PONV Risk Score and Plan: 1 and Treatment may vary due to age or medical condition and Propofol infusion  Airway Management Planned: Natural Airway and Simple Face Mask  Additional Equipment: None  Intra-op Plan:   Post-operative Plan:   Informed Consent: I have reviewed the patients History and Physical, chart, labs and discussed the procedure including the risks, benefits and alternatives for the proposed anesthesia with the patient or authorized representative who has indicated his/her understanding and acceptance.       Plan Discussed with: CRNA  Anesthesia Plan Comments:        Anesthesia Quick Evaluation

## 2020-03-25 NOTE — Progress Notes (Signed)
Orthopedic Tech Progress Note Patient Details:  Daniel Schneider 1976/01/18 [id number] PACU RN called requesting a PAIR OF CRUTCHES Ortho Devices Type of Ortho Device: Crutches Ortho Device/Splint Interventions: Application,Adjustment   Post Interventions Patient Tolerated: Well,Ambulated well Instructions Provided: Care of device,Poper ambulation with device   Janit Pagan 03/25/2020, 3:50 PM

## 2020-03-25 NOTE — Anesthesia Procedure Notes (Signed)
Anesthesia Regional Block: Adductor canal block   Pre-Anesthetic Checklist: ,, timeout performed, Correct Patient, Correct Site, Correct Laterality, Correct Procedure, Correct Position, site marked, Risks and benefits discussed, pre-op evaluation,  At surgeon's request and post-op pain management  Laterality: Right  Prep: Maximum Sterile Barrier Precautions used, chloraprep       Needles:  Injection technique: Single-shot  Needle Type: Echogenic Stimulator Needle     Needle Length: 9cm  Needle Gauge: 22     Additional Needles:   Procedures:,,,, ultrasound used (permanent image in chart),,,,  Narrative:  Start time: 03/25/2020 1:03 PM End time: 03/25/2020 1:05 PM Injection made incrementally with aspirations every 5 mL.  Performed by: Personally  Anesthesiologist: Brennan Bailey, MD  Additional Notes: Risks, benefits, and alternative discussed. Patient gave consent for procedure. Patient prepped and draped in sterile fashion. Sedation administered, patient remains easily responsive to voice. Relevant anatomy identified with ultrasound guidance. Local anesthetic given in 5cc increments with no signs or symptoms of intravascular injection. No pain or paraesthesias with injection. Patient monitored throughout procedure with signs of LAST or immediate complications. Tolerated well. Ultrasound image placed in chart.  Tawny Asal, MD

## 2020-03-25 NOTE — Brief Op Note (Signed)
   03/25/2020  2:20 PM  PATIENT:  Daniel Schneider  45 y.o. male  PRE-OPERATIVE DIAGNOSIS:  right knee suprapatellar cyst  POST-OPERATIVE DIAGNOSIS:  right knee suprapatellar cyst  PROCEDURE:  Procedure(s): RIGHT KNEE CYST REMOVAL  SURGEON:  Surgeon(s): Meredith Pel, MD  ASSISTANT: Nonenone  ANESTHESIA:   IV sedation  EBL: 3 ml    No intake/output data recorded.  BLOOD ADMINISTERED: none  DRAINS: none   LOCAL MEDICATIONS USED: Marcaine morphine clonidine  SPECIMEN: Specimen sent to path for analysis COUNTS:  YES  TOURNIQUET:  * No tourniquets in log *  DICTATION: .Other Dictation: Dictation Number [id number]  PLAN OF CARE: Discharge to home after PACU  PATIENT DISPOSITION:  PACU - hemodynamically stable

## 2020-03-25 NOTE — Transfer of Care (Signed)
Immediate Anesthesia Transfer of Care Note  Patient: Daniel Schneider  Procedure(s) Performed: RIGHT KNEE CYST REMOVAL (Right Knee)  Patient Location: PACU  Anesthesia Type:MAC  Level of Consciousness: awake, alert  and oriented  Airway & Oxygen Therapy: Patient Spontanous Breathing  Post-op Assessment: Report given to RN, Post -op Vital signs reviewed and stable and Patient moving all extremities  Post vital signs: Reviewed and stable  Last Vitals:  Vitals Value Taken Time  BP 109/64 03/25/20 1415  Temp 37.1 C 03/25/20 1415  Pulse 52 03/25/20 1420  Resp 11 03/25/20 1420  SpO2 100 % 03/25/20 1420  Vitals shown include unvalidated device data.  Last Pain:  Vitals:   03/25/20 1415  TempSrc:   PainSc: 0-No pain         Complications: No complications documented.

## 2020-03-25 NOTE — H&P (Signed)
Daniel Schneider is an 45 y.o. male.   Chief Complaint: Right knee cyst HPI: Daniel Schneider is a 45 year old patient with right knee pain.  Injured it several years ago.  Reports worsening pain coming primarily from a small 4 x 5 mm knot in the superficial dermal region around the patella.  States that it is tender to touch.  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  Whenever he does any type of laying down work or work on his knees this becomes symptomatic and somewhat debilitating.  He is otherwise healthy.  Past Medical History:  Diagnosis Date  . Cryptogenic stroke ([place]) 05/2015   [name] 05/27/2015; left hand weaker since (05/26/2015)  . PFO (patent foramen ovale)    a. 05/27/2015: s/p PFO closure w/ 30mm PFO occluder   . Sickle cell trait [facility])     Past Surgical History:  Procedure Laterality Date  . ASD AND VSD REPAIR  05/27/2015   [name] 05/27/2015  . CARDIAC CATHETERIZATION N/A 05/27/2015   Procedure: ASD/VSD Closure;  Surgeon: Sherren Mocha, MD;  Location: [place] [facility];  Service: Cardiovascular;  Laterality: N/A;  . TEE WITHOUT CARDIOVERSION N/A 04/21/2015   Procedure: TRANSESOPHAGEAL ECHOCARDIOGRAM (TEE);  Surgeon: Sueanne Margarita, MD;  Location: [facility] ENDOSCOPY;  Service: Cardiovascular;  Laterality: N/A;    Family History  Problem Relation Age of Onset  . Healthy Mother   . Healthy Father   . Sickle cell anemia Maternal Grandmother   . Heart disease Paternal Grandfather   . Gout Paternal Grandfather    Social History:  reports that he has never smoked. He has never used smokeless tobacco. He reports that he does not drink alcohol and does not use drugs.  Allergies:  Allergies  Allergen Reactions  . No Known Allergies     No medications prior to admission.    No results found for this or any previous visit (from the past 48 hour(s)). No results found.  Review of Systems  Musculoskeletal: Positive for arthralgias.  All other systems reviewed and are  negative.   There were no vitals taken for this visit. Physical Exam Vitals reviewed.  HENT:     Head: Normocephalic.     Nose: Nose normal.     Mouth/Throat:     Mouth: Mucous membranes are moist.  Eyes:     Pupils: Pupils are equal, round, and reactive to light.  Cardiovascular:     Rate and Rhythm: Normal rate.     Pulses: Normal pulses.  Pulmonary:     Effort: Pulmonary effort is normal.  Abdominal:     General: Abdomen is flat.  Musculoskeletal:     Cervical back: Normal range of motion.  Skin:    General: Skin is warm.     Capillary Refill: Capillary refill takes less than 2 seconds.  Neurological:     General: No focal deficit present.     Mental Status: He is alert.  Psychiatric:        Mood and Affect: Mood normal.     Ortho exam demonstrates full active and passive range of motion of the right knee.  No proximal lymphadenopathy.  No effusion.  Collateral crucial ligaments are stable.  Right around the inferior medial pole of the patella there is a freely mobile subdermal nodular mass 5 x 5 mm which has negative Tinel's.  Tender to palpation and when he is on his knees this is definitely right in the location where it would be compressed.  No overlying skin abnormality Assessment/Plan Impression is subdermal small mobile mass with low concern for malignancy.  This is freely mobile beneath the skin but is in a location which is potentially aggravating both the patellar periosteum as well as potential traversing superficial dermal nerve.  Does not feel like a neuroma.  Has negative Tinel's.  Unlikely that this is a loose body based on radiographs but it is possible that it could be an enclosed inflammatory reaction which is sealed off of foreign body.  Nonetheless the patient would like it removed which I think would be achievable.  Anticipate about a 1 to 2 cm incision with specimen removal and likely a period of 4 to 6 weeks of not being able to put direct pressure on that  knee.  Risk benefits are discussed.  All questions answered.  Patient would like to proceed which I think is reasonable.  Anderson Malta, MD 03/25/2020, 10:46 AM

## 2020-03-25 NOTE — Anesthesia Procedure Notes (Signed)
Procedure Name: MAC Date/Time: 03/25/2020 1:32 PM Performed by: Leonor Liv, CRNA Pre-anesthesia Checklist: Patient identified, Emergency Drugs available, Suction available, Patient being monitored and Timeout performed Patient Re-evaluated:Patient Re-evaluated prior to induction Oxygen Delivery Method: Simple face mask Placement Confirmation: positive ETCO2 Dental Injury: Teeth and Oropharynx as per pre-operative assessment

## 2020-03-26 ENCOUNTER — Encounter (HOSPITAL_COMMUNITY): Payer: Self-pay | Admitting: Orthopedic Surgery

## 2020-03-26 NOTE — Anesthesia Postprocedure Evaluation (Signed)
Anesthesia Post Note  Patient: Daniel Schneider  Procedure(s) Performed: RIGHT KNEE CYST REMOVAL (Right Knee)     Patient location during evaluation: PACU Anesthesia Type: MAC Level of consciousness: patient cooperative and awake and alert Pain management: pain level controlled Vital Signs Assessment: post-procedure vital signs reviewed and stable Respiratory status: spontaneous breathing Cardiovascular status: stable Anesthetic complications: no   No complications documented.  Last Vitals:  Vitals:   03/25/20 1430 03/25/20 1445  BP: 100/60 101/61  Pulse: (!) 50 (!) 52  Resp: 11 11  Temp:  37.1 C  SpO2: 99% 100%    Last Pain:  Vitals:   03/25/20 1445  TempSrc:   PainSc: [place]

## 2020-03-26 NOTE — Op Note (Signed)
NAME: Daniel Schneider, Daniel Schneider MEDICAL RECORD [id number] ACCOUNT [id number] DATE OF BIRTH:[birth year] FACILITY: [facility] LOCATION: [facility] PHYSICIAN:Ronne Stefanski Randel Pigg, MD  OPERATIVE REPORT  DATE OF PROCEDURE:  03/25/2020  PREOPERATIVE DIAGNOSIS:  Right knee peripatellar superficial cyst.  POSTOPERATIVE DIAGNOSIS:  Right knee peripatellar superficial cyst.  PROCEDURE:  Right knee peripatellar superficial cyst excision.  SURGEON:  Meredith Pel, MD  ASSISTANT:  None.  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  He presents now for operative management after he developed a cystic mass in the peripatellar region, which is painful for him to crawl  around on his knees with.  PROCEDURE IN DETAIL:  The patient was brought to the operating room where IV sedation anesthetic was induced.  Preoperative IV antibiotics administered.  Timeout was called.  Right knee field block was performed with Marcaine, morphine, clonidine.   Elliptical incision made around this garden pea sized mass in the subdermal tissue, freely mobile.  Good margins obtained.  Dissection taken down to the periosteum of the patella and the mass sent en bloc to pathology.  Thorough irrigation performed.   Skin edges anesthetized again.  Bleeding points encountered was controlled using electrocautery.  Skin closed using 2-0 Vicryl suture and 3-0 Monocryl.  Steri-Strips applied along with an impervious dressing, Ace wrap and knee immobilizer.  The patient  tolerated the procedure well without immediate complications, transferred to the recovery room in stable condition.  HN/NUANCE  D:03/25/2020 T:03/25/2020 JOB:[id number]

## 2020-03-27 LAB — SURGICAL PATHOLOGY

## 2020-03-30 DIAGNOSIS — M25861 Other specified joint disorders, right knee: Secondary | ICD-10-CM

## 2020-04-02 ENCOUNTER — Other Ambulatory Visit: Payer: Self-pay

## 2020-04-02 ENCOUNTER — Ambulatory Visit (INDEPENDENT_AMBULATORY_CARE_PROVIDER_SITE_OTHER): Payer: 59 | Admitting: Orthopedic Surgery

## 2020-04-02 DIAGNOSIS — M25561 Pain in right knee: Secondary | ICD-10-CM

## 2020-04-06 ENCOUNTER — Encounter: Payer: Self-pay | Admitting: Orthopedic Surgery

## 2020-04-06 NOTE — Progress Notes (Signed)
Post-Op Visit Note   Patient: Daniel Schneider           Date of Birth: 02-Dec-1975           MRN: [id number] Visit Date: 04/02/2020 PCP: Patient, No Pcp Per   Assessment & Plan:  Chief Complaint:  Chief Complaint  Patient presents with  . Right Knee - Routine Post Op    03/25/20 right knee cyst removal    Visit Diagnoses:  1. Right knee pain, unspecified chronicity     Plan: Daniel Schneider presents for follow-up of right knee cyst removal 03/25/2020.  He has been in an immobilizer.  On exam he has excellent range of motion of the knee and no effusion.  Pathology benign on the cyst.  Follow-up as needed regarding the knee.  He also reports left ankle instability.  I examined the left ankle and he does have some varus laxity on exam which is consistent with his history of frequent episodes of rolling of the ankle.  Discussed with him referral to our foot and ankle specialist but he wants to try left ankle ASO for now.  We will do that but if his symptoms persist I would recommend further work-up with radiographs scanning and possible surgical intervention.  Follow-Up Instructions: Return if symptoms worsen or fail to improve.   Orders:  No orders of the defined types were placed in this encounter.  No orders of the defined types were placed in this encounter.   Imaging: No results found.  PMFS History: Patient Active Problem List   Diagnosis Date Noted  . Cyst of right knee joint   . Anxiety 08/03/2016  . Cerebrovascular accident (CVA) due to embolism of right middle cerebral artery ([place]) 05/30/2015  . Prediabetes 05/27/2015  . Sickle cell trait ([place]) 05/26/2015  . PFO (patent foramen ovale)   . Acute CVA (cerebrovascular accident) ([place]) 04/20/2015  . Stroke ([place])   . HLD (hyperlipidemia)    Past Medical History:  Diagnosis Date  . Cryptogenic stroke ([place]) 05/2015   [name] 05/27/2015; left hand weaker since (05/26/2015)  . PFO (patent foramen ovale)    a. 05/27/2015: s/p PFO  closure w/ 82mm PFO occluder   . Sickle cell trait (H[facility])     Family History  Problem Relation Age of Onset  . Healthy Mother   . Healthy Father   . Sickle cell anemia Maternal Grandmother   . Heart disease Paternal Grandfather   . Gout Paternal Grandfather     Past Surgical History:  Procedure Laterality Date  . ASD AND VSD REPAIR  05/27/2015   [name] 05/27/2015  . CARDIAC CATHETERIZATION N/A 05/27/2015   Procedure: ASD/VSD Closure;  Surgeon: Sherren Mocha, MD;  Location: [facility];  Service: Cardiovascular;  Laterality: N/A;  . EAR CYST EXCISION Right 03/25/2020   Procedure: RIGHT KNEE CYST REMOVAL;  Surgeon: Meredith Pel, MD;  Location: [facility];  Service: Orthopedics;  Laterality: Right;  . TEE WITHOUT CARDIOVERSION N/A 04/21/2015   Procedure: TRANSESOPHAGEAL ECHOCARDIOGRAM (TEE);  Surgeon: Sueanne Margarita, MD;  Location: [facility] ENDOSCOPY;  Service: Cardiovascular;  Laterality: N/A;   Social History   Occupational History    Employer: OTHER    Comment: Self-Emplyed  Tobacco Use  . Smoking status: Never Smoker  . Smokeless tobacco: Never Used  Vaping Use  . Vaping Use: Never used  Substance and Sexual Activity  . Alcohol use: No    Alcohol/week: 0.0 standard drinks  . Drug use: No  .  Sexual activity: Not Currently

## 2021-02-23 ENCOUNTER — Ambulatory Visit
Admission: EM | Admit: 2021-02-23 | Discharge: 2021-02-23 | Disposition: A | Discharge status: Home / Self Care | Payer: 59 | Attending: Physician Assistant | Admitting: Physician Assistant

## 2021-02-23 ENCOUNTER — Other Ambulatory Visit: Payer: Self-pay

## 2021-02-23 ENCOUNTER — Encounter: Payer: Self-pay | Admitting: Emergency Medicine

## 2021-02-23 DIAGNOSIS — J069 Acute upper respiratory infection, unspecified: Secondary | ICD-10-CM | POA: Diagnosis not present

## 2021-02-23 LAB — POCT INFLUENZA A/B
Influenza A, POC: NEGATIVE
Influenza B, POC: NEGATIVE

## 2021-02-23 NOTE — ED Triage Notes (Signed)
Cough, post nasal drip, nasal congestion, generalized body aches starting 2 days ago. States he's producing green mucus, and having trouble sleeping from all of this

## 2021-02-23 NOTE — ED Provider Notes (Signed)
[facility]    CSN: [id number] Arrival date & time: 02/23/21  1553      History   Chief Complaint Chief Complaint  Patient presents with   Cough   Generalized Body Aches    HPI Daniel Schneider is a 46 y.o. male.   Patient here today for evaluation of cough, postnasal drip, congestion and body aches that started 2 days ago.  He states he has had some green mucus at times when he is coughed.  He does state that he has trouble sleeping due to symptoms.  He has tried over-the-counter medication without significant relief.  The history is provided by the patient.  Cough Associated symptoms: myalgias and sore throat   Associated symptoms: no chills, no ear pain, no eye discharge, no fever and no shortness of breath    Past Medical History:  Diagnosis Date   Cryptogenic stroke ([place]) 05/2015   [name] 05/27/2015; left hand weaker since (05/26/2015)   PFO (patent foramen ovale)    a. 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 ([place]) 05/30/2015   Prediabetes 05/27/2015   Sickle cell trait ([place]) 05/26/2015   PFO (patent foramen ovale)    Acute CVA (cerebrovascular accident) ([place]) 04/20/2015   Stroke ([place])    HLD (hyperlipidemia)     Past Surgical History:  Procedure Laterality Date   ASD AND VSD REPAIR  05/27/2015   [name] 05/27/2015   CARDIAC CATHETERIZATION N/A 05/27/2015   Procedure: ASD/VSD Closure;  Surgeon: Sherren Mocha, MD;  Location: [place];  Service: Cardiovascular;  Laterality: N/A;   EAR CYST EXCISION Right 03/25/2020   Procedure: RIGHT KNEE CYST REMOVAL;  Surgeon: Meredith Pel, MD;  Location: [place];  Service: Orthopedics;  Laterality: Right;   TEE WITHOUT CARDIOVERSION N/A 04/21/2015   Procedure: TRANSESOPHAGEAL  ECHOCARDIOGRAM (TEE);  Surgeon: Sueanne Margarita, MD;  Location: [facility] ENDOSCOPY;  Service: Cardiovascular;  Laterality: N/A;       Home Medications    Prior to Admission medications   Medication Sig Start Date End Date Taking? Authorizing Provider  Ascorbic Acid (VITAMIN C) 1000 MG tablet Take 1,000 mg by mouth daily.    [provider]  Black Currant Seed Oil 500 MG CAPS Take 500 mg by mouth daily.    [provider]  gabapentin (NEURONTIN) 300 MG capsule Take 1 capsule (300 mg total) by mouth 3 (three) times daily. 03/25/20 03/25/21  Magnant, Charles L, PA-C  ketorolac (TORADOL) 10 MG tablet Take 1 tablet (10 mg total) by mouth every 8 (eight) hours as needed. 03/25/20   Magnant, Gerrianne Scale, PA-C  oxyCODONE-acetaminophen (PERCOCET) 5-325 MG tablet Take 1 tablet by mouth every 6 (six) hours as needed for severe pain. 03/25/20 03/25/21  Magnant, Gerrianne Scale, PA-C    Family History Family History  Problem Relation Age of Onset   Healthy Mother    Healthy Father    Sickle cell anemia Maternal Grandmother    Heart disease Paternal Grandfather    Gout Paternal Grandfather     Social History Social History   Tobacco Use   Smoking status: Never   Smokeless tobacco: Never  Vaping Use   Vaping Use: Never used  Substance Use Topics  Alcohol use: No    Alcohol/week: 0.0 standard drinks   Drug use: No     Allergies   No known allergies   Review of Systems Review of Systems  Constitutional:  Negative for chills and fever.  HENT:  Positive for congestion and sore throat. Negative for ear pain.   Eyes:  Negative for discharge and redness.  Respiratory:  Positive for cough. Negative for shortness of breath.   Gastrointestinal:  Negative for abdominal pain, nausea and vomiting.  Musculoskeletal:  Positive for myalgias.    Physical Exam Triage Vital Signs ED Triage Vitals  Enc Vitals Group     BP      Pulse      Resp      Temp      Temp src      SpO2       Weight      Height      Head Circumference      Peak Flow      Pain Score      Pain Loc      Pain Edu?      Excl. in [place]?    No data found.  Updated Vital Signs BP 136/83 (BP Location: Left Arm)    Pulse 60    Temp 98.2 F (36.8 C) (Oral)    Resp 16    SpO2 98%      Physical Exam Vitals and nursing note reviewed.  Constitutional:      General: He is not in acute distress.    Appearance: Normal appearance. He is not ill-appearing.  HENT:     Head: Normocephalic and atraumatic.     Nose: Congestion present.     Mouth/Throat:     Mouth: Mucous membranes are moist.     Pharynx: Oropharynx is clear. No oropharyngeal exudate or posterior oropharyngeal erythema.  Eyes:     Conjunctiva/sclera: Conjunctivae normal.  Cardiovascular:     Rate and Rhythm: Normal rate and regular rhythm.     Heart sounds: Normal heart sounds. No murmur heard. Pulmonary:     Effort: Pulmonary effort is normal. No respiratory distress.     Breath sounds: Normal breath sounds. No wheezing, rhonchi or rales.     Comments: Deep breathing produces cough Skin:    General: Skin is warm and dry.  Neurological:     Mental Status: He is alert.  Psychiatric:        Mood and Affect: Mood normal.        Thought Content: Thought content normal.     UC Treatments / Results  Labs (all labs ordered are listed, but only abnormal results are displayed) Labs Reviewed  NOVEL CORONAVIRUS, NAA  POCT INFLUENZA A/B    EKG   Radiology No results found.  Procedures Procedures (including critical care time)  Medications Ordered in UC Medications - No data to display  Initial Impression / Assessment and Plan / UC Course  I have reviewed the triage vital signs and the nursing notes.  Pertinent labs & imaging results that were available during my care of the patient were reviewed by me and considered in my medical decision making (see chart for details).   Suspect viral etiology of symptoms. Flu test  negative- will order covid screening. Advised symptomatic treatment, increased fluids and rest otherwise. Recommend follow up with any further concerns.    Final Clinical Impressions(s) / UC Diagnoses   Final diagnoses:  Acute upper respiratory infection   Discharge  Instructions   None    ED Prescriptions   None    PDMP not reviewed this encounter.   Francene Finders, PA-C 02/23/21 1915

## 2021-02-24 LAB — NOVEL CORONAVIRUS, NAA: SARS-CoV-2, NAA: NOT DETECTED

## 2021-02-24 LAB — SARS-COV-2, NAA 2 DAY TAT

## 2021-07-08 ENCOUNTER — Other Ambulatory Visit: Payer: Self-pay | Admitting: Family Medicine

## 2021-07-08 ENCOUNTER — Other Ambulatory Visit (HOSPITAL_COMMUNITY): Payer: Self-pay | Admitting: Family Medicine

## 2021-07-08 DIAGNOSIS — M5136 Other intervertebral disc degeneration, lumbar region: Secondary | ICD-10-CM

## 2021-07-08 DIAGNOSIS — M51369 Other intervertebral disc degeneration, lumbar region without mention of lumbar back pain or lower extremity pain: Secondary | ICD-10-CM

## 2021-07-10 ENCOUNTER — Ambulatory Visit (HOSPITAL_BASED_OUTPATIENT_CLINIC_OR_DEPARTMENT_OTHER)
Admission: RE | Admit: 2021-07-10 | Discharge: 2021-07-10 | Disposition: A | Discharge status: Home / Self Care | Payer: 59 | Source: Ambulatory Visit | Attending: Family Medicine | Admitting: Family Medicine

## 2021-07-10 DIAGNOSIS — M48061 Spinal stenosis, lumbar region without neurogenic claudication: Secondary | ICD-10-CM | POA: Insufficient documentation

## 2021-07-10 DIAGNOSIS — M4807 Spinal stenosis, lumbosacral region: Secondary | ICD-10-CM | POA: Insufficient documentation

## 2021-07-10 DIAGNOSIS — M51369 Other intervertebral disc degeneration, lumbar region without mention of lumbar back pain or lower extremity pain: Secondary | ICD-10-CM

## 2021-07-10 DIAGNOSIS — M5117 Intervertebral disc disorders with radiculopathy, lumbosacral region: Secondary | ICD-10-CM | POA: Diagnosis not present

## 2021-07-10 DIAGNOSIS — M5136 Other intervertebral disc degeneration, lumbar region: Secondary | ICD-10-CM | POA: Insufficient documentation

## 2021-09-17 ENCOUNTER — Ambulatory Visit: Payer: 59 | Attending: Physical Therapy | Admitting: Physical Therapy

## 2021-09-21 DIAGNOSIS — R7303 Prediabetes: Secondary | ICD-10-CM | POA: Diagnosis not present

## 2021-09-21 DIAGNOSIS — Z79899 Other long term (current) drug therapy: Secondary | ICD-10-CM | POA: Diagnosis not present

## 2021-09-21 DIAGNOSIS — E7800 Pure hypercholesterolemia, unspecified: Secondary | ICD-10-CM | POA: Diagnosis not present

## 2021-09-28 DIAGNOSIS — Z125 Encounter for screening for malignant neoplasm of prostate: Secondary | ICD-10-CM | POA: Diagnosis not present

## 2021-09-28 DIAGNOSIS — M5116 Intervertebral disc disorders with radiculopathy, lumbar region: Secondary | ICD-10-CM | POA: Diagnosis not present

## 2021-09-28 DIAGNOSIS — R7303 Prediabetes: Secondary | ICD-10-CM | POA: Diagnosis not present

## 2021-09-28 DIAGNOSIS — E7800 Pure hypercholesterolemia, unspecified: Secondary | ICD-10-CM | POA: Diagnosis not present

## 2021-09-28 DIAGNOSIS — M5136 Other intervertebral disc degeneration, lumbar region: Secondary | ICD-10-CM | POA: Diagnosis not present

## 2021-09-28 DIAGNOSIS — R946 Abnormal results of thyroid function studies: Secondary | ICD-10-CM | POA: Diagnosis not present

## 2021-09-30 DIAGNOSIS — M5451 Vertebrogenic low back pain: Secondary | ICD-10-CM | POA: Diagnosis not present

## 2021-10-27 DIAGNOSIS — M5416 Radiculopathy, lumbar region: Secondary | ICD-10-CM | POA: Diagnosis not present

## 2021-11-10 DIAGNOSIS — Z1211 Encounter for screening for malignant neoplasm of colon: Secondary | ICD-10-CM | POA: Diagnosis not present

## 2021-11-10 DIAGNOSIS — D123 Benign neoplasm of transverse colon: Secondary | ICD-10-CM | POA: Diagnosis not present

## 2021-11-10 DIAGNOSIS — D122 Benign neoplasm of ascending colon: Secondary | ICD-10-CM | POA: Diagnosis not present

## 2021-11-10 DIAGNOSIS — K648 Other hemorrhoids: Secondary | ICD-10-CM | POA: Diagnosis not present

## 2021-11-11 ENCOUNTER — Encounter (HOSPITAL_COMMUNITY): Payer: Self-pay

## 2021-11-11 ENCOUNTER — Emergency Department (HOSPITAL_COMMUNITY): Payer: 59

## 2021-11-11 ENCOUNTER — Emergency Department (HOSPITAL_COMMUNITY)
Admission: EM | Admit: 2021-11-11 | Discharge: 2021-11-11 | Disposition: A | Discharge status: Home / Self Care | Payer: 59 | Attending: Emergency Medicine | Admitting: Emergency Medicine

## 2021-11-11 ENCOUNTER — Ambulatory Visit (HOSPITAL_COMMUNITY)
Admission: EM | Admit: 2021-11-11 | Discharge: 2021-11-11 | Disposition: A | Discharge status: Home / Self Care | Payer: 59 | Attending: Internal Medicine | Admitting: Internal Medicine

## 2021-11-11 ENCOUNTER — Other Ambulatory Visit: Payer: Self-pay

## 2021-11-11 DIAGNOSIS — I63311 Cerebral infarction due to thrombosis of right middle cerebral artery: Secondary | ICD-10-CM | POA: Diagnosis not present

## 2021-11-11 DIAGNOSIS — H538 Other visual disturbances: Secondary | ICD-10-CM

## 2021-11-11 DIAGNOSIS — R29818 Other symptoms and signs involving the nervous system: Secondary | ICD-10-CM

## 2021-11-11 DIAGNOSIS — R42 Dizziness and giddiness: Secondary | ICD-10-CM | POA: Insufficient documentation

## 2021-11-11 DIAGNOSIS — J341 Cyst and mucocele of nose and nasal sinus: Secondary | ICD-10-CM | POA: Diagnosis not present

## 2021-11-11 DIAGNOSIS — I63411 Cerebral infarction due to embolism of right middle cerebral artery: Secondary | ICD-10-CM | POA: Diagnosis not present

## 2021-11-11 LAB — APTT: aPTT: 27 seconds (ref 24–36)

## 2021-11-11 LAB — CBC
HCT: 38.6 % — ABNORMAL LOW (ref 39.0–52.0)
Hemoglobin: 13.4 g/dL (ref 13.0–17.0)
MCH: 30.1 pg (ref 26.0–34.0)
MCHC: 34.7 g/dL (ref 30.0–36.0)
MCV: 86.7 fL (ref 80.0–100.0)
Platelets: 255 10*3/uL (ref 150–400)
RBC: 4.45 MIL/uL (ref 4.22–5.81)
RDW: 13.9 % (ref 11.5–15.5)
WBC: 6.9 10*3/uL (ref 4.0–10.5)
nRBC: 0 % (ref 0.0–0.2)

## 2021-11-11 LAB — I-STAT CHEM 8, ED
BUN: 16 mg/dL (ref 6–20)
Calcium, Ion: 1.07 mmol/L — ABNORMAL LOW (ref 1.15–1.40)
Chloride: 103 mmol/L (ref 98–111)
Creatinine, Ser: 1.4 mg/dL — ABNORMAL HIGH (ref 0.61–1.24)
Glucose, Bld: 100 mg/dL — ABNORMAL HIGH (ref 70–99)
HCT: 40 % (ref 39.0–52.0)
Hemoglobin: 13.6 g/dL (ref 13.0–17.0)
Potassium: 3.5 mmol/L (ref 3.5–5.1)
Sodium: 138 mmol/L (ref 135–145)
TCO2: 25 mmol/L (ref 22–32)

## 2021-11-11 LAB — DIFFERENTIAL
Abs Immature Granulocytes: 0.04 10*3/uL (ref 0.00–0.07)
Basophils Absolute: 0 10*3/uL (ref 0.0–0.1)
Basophils Relative: 1 %
Eosinophils Absolute: 0.1 10*3/uL (ref 0.0–0.5)
Eosinophils Relative: 1 %
Immature Granulocytes: 1 %
Lymphocytes Relative: 17 %
Lymphs Abs: 1.1 10*3/uL (ref 0.7–4.0)
Monocytes Absolute: 0.5 10*3/uL (ref 0.1–1.0)
Monocytes Relative: 7 %
Neutro Abs: 5.1 10*3/uL (ref 1.7–7.7)
Neutrophils Relative %: 73 %

## 2021-11-11 LAB — RAPID URINE DRUG SCREEN, HOSP PERFORMED
Amphetamines: NOT DETECTED
Barbiturates: NOT DETECTED
Benzodiazepines: NOT DETECTED
Cocaine: NOT DETECTED
Opiates: NOT DETECTED
Tetrahydrocannabinol: NOT DETECTED

## 2021-11-11 LAB — COMPREHENSIVE METABOLIC PANEL
ALT: 35 U/L (ref 0–44)
AST: 28 U/L (ref 15–41)
Albumin: 4 g/dL (ref 3.5–5.0)
Alkaline Phosphatase: 51 U/L (ref 38–126)
Anion gap: 11 (ref 5–15)
BUN: 15 mg/dL (ref 6–20)
CO2: 23 mmol/L (ref 22–32)
Calcium: 8.9 mg/dL (ref 8.9–10.3)
Chloride: 103 mmol/L (ref 98–111)
Creatinine, Ser: 1.4 mg/dL — ABNORMAL HIGH (ref 0.61–1.24)
GFR, Estimated: >60 mL/min (ref >60)
Glucose, Bld: 103 mg/dL — ABNORMAL HIGH (ref 70–99)
Potassium: 3.5 mmol/L (ref 3.5–5.1)
Sodium: 137 mmol/L (ref 135–145)
Total Bilirubin: 0.7 mg/dL (ref 0.3–1.2)
Total Protein: 6.5 g/dL (ref 6.5–8.1)

## 2021-11-11 LAB — PROTIME-INR
INR: 1.1 (ref 0.8–1.2)
Prothrombin Time: 13.6 seconds (ref 11.4–15.2)

## 2021-11-11 LAB — ETHANOL: Alcohol, Ethyl (B): <10 mg/dL (ref <10)

## 2021-11-11 LAB — CBG MONITORING, ED
Glucose-Capillary: 89 mg/dL (ref 70–99)
Glucose-Capillary: 113 mg/dL — ABNORMAL HIGH (ref 70–99)

## 2021-11-11 MED ORDER — DIPHENHYDRAMINE HCL 50 MG/ML IJ SOLN
25.0000 mg | Freq: Once | INTRAMUSCULAR | Status: AC
  Administered 2021-11-11: 25 mg via INTRAVENOUS
  Filled 2021-11-11: qty 1

## 2021-11-11 MED ORDER — GLUCOSE 4 G PO CHEW
CHEWABLE_TABLET | ORAL | Status: AC
  Filled 2021-11-11: qty 4

## 2021-11-11 MED ORDER — DEXAMETHASONE SODIUM PHOSPHATE 4 MG/ML IJ SOLN
4.0000 mg | Freq: Once | INTRAMUSCULAR | Status: AC
  Administered 2021-11-11: 4 mg via INTRAVENOUS
  Filled 2021-11-11: qty 1

## 2021-11-11 MED ORDER — SODIUM CHLORIDE 0.9% FLUSH
3.0000 mL | Freq: Once | INTRAVENOUS | Status: AC
  Administered 2021-11-11: 3 mL via INTRAVENOUS

## 2021-11-11 MED ORDER — KETOROLAC TROMETHAMINE 15 MG/ML IJ SOLN
15.0000 mg | Freq: Once | INTRAMUSCULAR | Status: AC
  Administered 2021-11-11: 15 mg via INTRAVENOUS
  Filled 2021-11-11: qty 1

## 2021-11-11 MED ORDER — IOHEXOL 350 MG/ML SOLN
75.0000 mL | Freq: Once | INTRAVENOUS | Status: AC | PRN
  Administered 2021-11-11: 75 mL via INTRAVENOUS

## 2021-11-11 MED ORDER — PROCHLORPERAZINE EDISYLATE 10 MG/2ML IJ SOLN
5.0000 mg | Freq: Once | INTRAMUSCULAR | Status: AC
  Administered 2021-11-11: 5 mg via INTRAVENOUS
  Filled 2021-11-11: qty 2

## 2021-11-11 NOTE — ED Triage Notes (Signed)
Pt BIB [facility] from [facility] for Code Stroke. LKW noon today. Pt drove himself to UC.  Pt with onset of dizziness, confusion, left sided weakness, and left eye blurry vision. CBG 89 at UC and given glucose. Pt had colonoscopy yesterday.  VSS with [facility]  18LH

## 2021-11-11 NOTE — ED Notes (Signed)
Patient verbalizes understanding of discharge instructions. Opportunity for questioning and answers were provided. Armband removed by staff, pt discharged from ED via wheelchair with significant other.

## 2021-11-11 NOTE — ED Triage Notes (Signed)
Pt is here for headache , dizziness , off balance since 4 pm

## 2021-11-11 NOTE — ED Notes (Signed)
[facility] received report from [place], np.

## 2021-11-11 NOTE — ED Notes (Signed)
Pt transported to MRI 

## 2021-11-11 NOTE — Consult Note (Signed)
Neurology Consultation Reason for Consult: Dizziness Referring Physician: Oswald Hillock, C  CC: Dizziness  History is obtained from: Patient  HPI: Daniel Schneider is a 46 y.o. male with a history of sickle cell trait as well as cryptogenic stroke in April 2017 treated with PFO closure who presents with dizziness that started around noon.  He states that got worse while he was at work, and left-sided his face became numb.  He also has some blurred vision.  He was noted to have had headache earlier, though he denies that to me at this time.  He denies visual changes.   LKW: Noon tpa given?: no, out of window   Past Medical History:  Diagnosis Date   Cryptogenic stroke ([place]) 05/2015   [name] 05/27/2015; left hand weaker since (05/26/2015)   PFO (patent foramen ovale)    a. 05/27/2015: s/p PFO closure w/ 45m PFO occluder    Sickle cell trait ([place]      Family History  Problem Relation Age of Onset   Healthy Mother    Healthy Father    Sickle cell anemia Maternal Grandmother    Heart disease Paternal Grandfather    Gout Paternal Grandfather      Social History:  reports that he has never smoked. He has never used smokeless tobacco. He reports that he does not drink alcohol and does not use drugs.   Exam: Current vital signs: There were no vitals taken for this visit. Vital signs in last 24 hours: Temp:  [98 F (36.7 C)] 98 F (36.7 C) (10/04 1908) Pulse Rate:  [62] 62 (10/04 1908) Resp:  [12] 12 (10/04 1908) BP: (152)/(92) 152/92 (10/04 1908) SpO2:  [96 %] 96 % (10/04 1908)   Physical Exam  Constitutional: Appears well-developed and well-nourished.  Psych: Affect appropriate to situation Eyes: No scleral injection HENT: No OP obstruction MSK: no joint deformities.  Cardiovascular: Normal rate and regular rhythm.  Respiratory: Effort normal, non-labored breathing GI: Soft.  No distension. There is no tenderness.  Skin: WDI  Neuro: Mental Status: Patient is awake,  alert, oriented to person, place, month, year, and situation. Patient is able to give a clear and coherent history. No signs of aphasia or neglect Cranial Nerves: II: Visual Fields are full. Pupils are equal, round, and reactive to light.   III,IV, VI: EOMI without ptosis or diploplia.  V: Facial sensation is symmetric to temperature VII: Facial movement is symmetric.  VIII: hearing is intact to voice X: Uvula elevates symmetrically XI: Shoulder shrug is symmetric. XII: tongue is midline without atrophy or fasciculations.  Motor: Tone is normal. Bulk is normal. 5/5 strength was present in bilateral upper extremities, he has 4/5 strength in his left lower extremity with drift Sensory: Sensation is symmetric to light touch and temperature in the arms and legs. Cerebellar: Consistent with weakness on left, intact on right     I have reviewed labs in epic and the results pertinent to this consultation are: CBG 89  I have reviewed the images obtained: CT head-negative, CTA-hypoplastic posterior circulation consistent with normal variant.  No acute findings.  Impression: 46year old male with new onset dizziness.  He will need further imaging with MRI.  I think that complicated migraine is one possibility, but given his history I do think ruling out other causes with MRI would be important.  If he does have a stroke on the MRI, then he will need to be admitted for secondary risk factor evaluation and modification.  Recommendations: 1)  MRI brain 2) stroke work-up if positive. 3) orthostatic vital signs 4) if MRI is negative, would consider migraine cocktail.   Roland Rack, MD [facility] [phone]  If 7pm- 7am, please page neurology on call as listed in [place].

## 2021-11-11 NOTE — ED Notes (Signed)
Patient is being discharged from the Urgent Care and sent to the Emergency Department via [facility] . Per Mare Ferrari, NP, patient is in need of higher level of care due to concerns for stroke . Patient is aware and verbalizes understanding of plan of care.  Vitals:   11/11/21 1908  BP: (!) 152/92  Pulse: 62  Resp: 12  Temp: 98 F (36.7 C)  SpO2: 96%

## 2021-11-11 NOTE — ED Notes (Signed)
Dr Oswald Hillock at bedside

## 2021-11-11 NOTE — ED Notes (Signed)
Patient has left [facility]

## 2021-11-11 NOTE — ED Provider Notes (Signed)
[place]    CSN: [id number] Arrival date & time: 11/11/21  1849      History   Chief Complaint Chief Complaint  Patient presents with   Dizziness   Headache    HPI Daniel Schneider is a 46 y.o. male.   Patient presents to urgent care with complaint of dizziness and left-sided blurry vision that started abruptly at 4 PM today while he was at work.  The left side of his face became numb and he developed foggy/blurry vision.  He is currently very dizzy and had a difficult time ambulating back to the urgent care room for assessment without falling.  Dizziness is worse with head movement and he reports pain/pressure to the left sided occipital aspect of the head.  When symptoms started initially he took an aspirin which did not seem to help. Symptoms have worsened over the last 3 hours so he drove himself to urgent care for evaluation.  He is not diabetic and last ate at approximately 2 PM today.  He remembers that he ate salmon and some sweet potatoes.  He has had a patent foramen ovale in the past but this was repaired in 2017.  History of acute stroke in 2017 as well and states that he has not had strokelike symptoms since then.  Also has a significant medical history of hyperlipidemia but has not seen his primary care provider in "a long time" for healthcare prevention.    Dizziness Associated symptoms: headaches   Headache Associated symptoms: dizziness     Past Medical History:  Diagnosis Date   Cryptogenic stroke ([place]) 05/2015   [name] 05/27/2015; left hand weaker since (05/26/2015)   PFO (patent foramen ovale)    a. 05/27/2015: s/p PFO closure w/ 63m PFO occluder    Sickle cell trait ([facility]     Patient Active Problem List   Diagnosis Date Noted   Cyst of right knee joint    Anxiety 08/03/2016   Cerebrovascular accident (CVA) due to embolism of right middle cerebral artery ([place] 05/30/2015   Prediabetes 05/27/2015   Sickle cell trait ([place] 05/26/2015   PFO  (patent foramen ovale)    [place] ([place]) [place]   [place]    HLD (hyperlipidemia)     Past Surgical History:  Procedure Laterality Date   ASD AND VSD REPAIR  05/27/2015   /[name]4/18/2017   CARDIAC CATHETERIZATION N/A 05/27/2015   Procedure: ASD/VSD Closure;  Surgeon: MSherren Mocha MD;  [facility];  Service: Cardiovascular;  Laterality: N/A;   EAR CYST EXCISION Right 03/25/2020   Procedure: RIGHT KNEE CYST REMOVAL;  Surgeon: DMeredith Pel MD;  [place]  Service: Orthopedics;  Laterality: Right;   TEE WITHOUT CARDIOVERSION N/A 04/21/2015   Procedure: TRANSESOPHAGEAL ECHOCARDIOGRAM (TEE);  Surgeon: TSueanne Margarita MD;  [facility];  Service: Cardiovascular;  Laterality: N/A;       Home Medications    Prior to Admission medications   Medication Sig Start Date End Date Taking? Authorizing Provider  Ascorbic Acid (VITAMIN C) 1000 MG tablet Take 1,000 mg by mouth daily.    [provider]  Black Currant Seed Oil 500 MG CAPS Take 500 mg by mouth daily.    [provider]  gabapentin (NEURONTIN) 300 MG capsule Take 1 capsule (300 mg total) by mouth 3 (three) times daily. 03/25/20 03/25/21  Magnant, Charles L, PA-C  ketorolac (TORADOL) 10 MG tablet Take 1 tablet (10 mg  total) by mouth every 8 (eight) hours as needed. 03/25/20   Magnant, Gerrianne Scale, PA-C    Family History Family History  Problem Relation Age of Onset   Healthy Mother    Healthy Father    Sickle cell anemia Maternal Grandmother    Heart disease Paternal Grandfather    Gout Paternal Grandfather     Social History Social History   Tobacco Use   Smoking status: Never   Smokeless tobacco: Never  Vaping Use   Vaping Use: Never used  Substance Use Topics   Alcohol use: No    Alcohol/week: 0.0 standard drinks of alcohol   Drug use: No     Allergies   No known allergies   Review of Systems Review of Systems   Neurological:  Positive for dizziness and headaches.  Per HPI   Physical Exam Triage Vital Signs ED Triage Vitals [11/11/21 1908]  Enc Vitals Group     BP (!) 152/92     Pulse Rate 62     Resp 12     Temp 98 F (36.7 C)     Temp Source Oral     SpO2 96 %     Weight      Height      Head Circumference      Peak Flow      Pain Score      Pain Loc      Pain Edu?      Excl. in [place]?    No data found.  Updated Vital Signs BP (!) 152/92 (BP Location: Right Arm)   Pulse 62   Temp 98 F (36.7 C) (Oral)   Resp 12   SpO2 96%   Visual Acuity Right Eye Distance:   Left Eye Distance:   Bilateral Distance:    Right Eye Near:   Left Eye Near:    Bilateral Near:     Physical Exam Vitals and nursing note reviewed.  Constitutional:      Appearance: He is ill-appearing.  HENT:     Head: Normocephalic and atraumatic.     Right Ear: Hearing and external ear normal.     Left Ear: Hearing and external ear normal.     Nose: Nose normal.     Mouth/Throat:     Lips: Pink.  Eyes:     General: Lids are normal. Gaze aligned appropriately. No visual field deficit.    Extraocular Movements:     Right eye: Abnormal extraocular motion present.     Left eye: Abnormal extraocular motion present.     Conjunctiva/sclera: Conjunctivae normal.     Comments: EOMs worsen dizziness and cause left eye pain.  Visual fields are intact.  No hemianopsia.  Pulmonary:     Effort: Pulmonary effort is normal.  Musculoskeletal:     Cervical back: Neck supple.  Skin:    General: Skin is warm and dry.     Capillary Refill: Capillary refill takes less than 2 seconds.     Findings: No rash.  Neurological:     General: No focal deficit present.     Mental Status: He is alert and oriented to person, place, and time. Mental status is at baseline.     Cranial Nerves: No dysarthria or facial asymmetry.     Sensory: Sensation is intact.     Coordination: Romberg sign positive. Coordination abnormal.   Psychiatric:        Mood and Affect: Mood normal.  Speech: Speech normal.        Behavior: Behavior normal.        Thought Content: Thought content normal.        Judgment: Judgment normal.      UC Treatments / Results  Labs (all labs ordered are listed, but only abnormal results are displayed) Labs Reviewed  CBG MONITORING, ED    EKG   Radiology  Procedures Procedures (including critical care time)  Medications Ordered in UC Medications - No data to display  Initial Impression / Assessment and Plan / UC Course  I have reviewed the triage vital signs and the nursing notes.  Pertinent labs & imaging results that were available during my care of the patient were reviewed by me and considered in my medical decision making (see chart for details).   Code Stroke activated due to patient's concerning symptoms for possible acute posterior infarct. Consulted Dr. Windy Carina who agrees with Code Stroke activation. Discussed these recommendations with patient who expresses agreement with plan. Difficult to discern acute stroke versus acute delirium/dehydration. [facility] called. 18 gauge IV placed for CT angio. CBG 89 in clinic. Unknown diabetes diagnosis as he has not received medical care in a couple of years. Dose of glucose tablets given in urgent care setting to raise blood sugar if this is causing acute symptoms, although doubtful. Patient transported to ED via [facility] in stable condition.  Final Clinical Impressions(s) / UC Diagnoses   Final diagnoses:  Dizziness  Blurry vision, left eye  Positive Romberg test   Discharge Instructions   None    ED Prescriptions   None    PDMP not reviewed this encounter.   Talbot Grumbling, [place] 11/13/21 1058

## 2021-11-11 NOTE — Code Documentation (Signed)
Responded to Code Stroke called at [facility] for dizziness, weakness, and face numbness, LSN-1200. Pt arrived at 1937, CBG-113, NIH-1 for L leg drift, CT head negative for acute changes, CTA-no LVO. TNK not given-pt outside window. Plan MRI.

## 2021-11-11 NOTE — ED Provider Notes (Signed)
[place] EMERGENCY DEPARTMENT Provider Note   CSN: [id number] Arrival date & time: 11/11/21  5284  An emergency department physician performed an initial assessment on this suspected stroke patient at 55.  History Chief Complaint  Patient presents with   Code Stroke    HPI Daniel Schneider is a 46 y.o. male presenting for episode of dizziness.  Patient has an extensive medical history including history of CVA in the past.  Thought to secondary to patent foramen ovale now status postsurgical pair.  Today, patient has had multiple episodes of dizziness intermittently.  It occurs when he changes position or stands.  He denies presyncope.  On reassessment, patient has a complete resolution of symptoms.  He states this feels very different than prior stroke. He has not been taking his daily baby aspirin.  He does have a history of migraines including atypical migraines.  Patient's recorded medical, surgical, social, medication list and allergies were reviewed in the Snapshot window as part of the initial history.   Review of Systems   Review of Systems  Constitutional:  Negative for chills and fever.  HENT:  Negative for ear pain and sore throat.   Eyes:  Negative for pain and visual disturbance.  Respiratory:  Negative for cough and shortness of breath.   Cardiovascular:  Negative for chest pain and palpitations.  Gastrointestinal:  Negative for abdominal pain and vomiting.  Genitourinary:  Negative for dysuria and hematuria.  Musculoskeletal:  Negative for arthralgias and back pain.  Skin:  Negative for color change and rash.  Neurological:  Positive for dizziness. Negative for seizures and syncope.  All other systems reviewed and are negative.   Physical Exam Updated Vital Signs BP 130/77   Pulse (!) 52   Temp 98 F (36.7 C)   Resp 17   Ht '5\' 10"'$  (1.778 m)   Wt 87.4 kg   SpO2 99%   BMI 27.65 kg/m  Physical Exam Vitals and nursing note reviewed.   Constitutional:      General: He is not in acute distress.    Appearance: He is well-developed.  HENT:     Head: Normocephalic and atraumatic.  Eyes:     Conjunctiva/sclera: Conjunctivae normal.  Cardiovascular:     Rate and Rhythm: Normal rate and regular rhythm.     Heart sounds: No murmur heard. Pulmonary:     Effort: Pulmonary effort is normal. No respiratory distress.     Breath sounds: Normal breath sounds.  Abdominal:     Palpations: Abdomen is soft.     Tenderness: There is no abdominal tenderness.  Musculoskeletal:        General: No swelling.     Cervical back: Neck supple.  Skin:    General: Skin is warm and dry.     Capillary Refill: Capillary refill takes less than 2 seconds.  Neurological:     Mental Status: He is alert.  Psychiatric:        Mood and Affect: Mood normal.      ED Course/ Medical Decision Making/ A&P Clinical Course as of 11/11/21 2346  Wed Nov 11, 2021  2010 MRI  [CC]    Clinical Course User Index [CC] Tretha Sciara, MD    Procedures Procedures   Medications Ordered in ED Medications  sodium chloride flush (NS) 0.9 % injection 3 mL (3 mLs Intravenous Given 11/11/21 2029)  iohexol (OMNIPAQUE) 350 MG/ML injection 75 mL (75 mLs Intravenous Contrast Given 11/11/21 1956)  prochlorperazine (COMPAZINE)  injection 5 mg (5 mg Intravenous Given 11/11/21 2303)  diphenhydrAMINE (BENADRYL) injection 25 mg (25 mg Intravenous Given 11/11/21 2259)  dexamethasone (DECADRON) injection 4 mg (4 mg Intravenous Given 11/11/21 2301)  ketorolac (TORADOL) 15 MG/ML injection 15 mg (15 mg Intravenous Given 11/11/21 2302)  Medical Decision Making:    [name] is a 46 y.o. male  who presented to the ED today with dizziness, paroxysmally.  Due to these symptoms, nursing activated a CODE STROKE per hospital protocol.   Patient's presentation is complicated by their history of CVA secondary to patent foramen ovale.  Patient placed on continuous vitals and  telemetry monitoring while in ED which was reviewed periodically.   On my initial exam, the pt was in no acute distress, glucose was within normal limits and deficits include dizziness.  Deficits are not persistent.   Reviewed and confirmed nursing documentation for past medical history, family history, social history.  Initial Assessment and Plan:   Patient immediately evaluated jointly by neurology and emergency department providers with focus on concern for stroke versus paroxysmal benign vertigo versus vestibular migraine versus complex migraine.   This is most consistent with an acute life/limb threatening illness complicated by underlying chronic conditions.  Labs Labs reviewed without evidence of clinically relevant abnormality.   EKG EKG was reviewed independently. Rate, rhythm, axis, intervals all examined and without medically relevant abnormality. ST segments without concerns for elevations.    Radiology  Images reviewed independently, agree with radiology report at this time.   MR BRAIN WO CONTRAST  Final Result    CT ANGIO HEAD NECK W WO CM (CODE STROKE)  Final Result    CT HEAD CODE STROKE WO CONTRAST  Final Result        Final Assessment and Plan:   On reassessment, patient has had resolution of his symptoms.  He does have a slight headache in his left posterior orbital area consistent with likely migrainous episode tonight.  Treated with migraine medications and experiencing resolution of symptoms at this time.  Patient requesting discharge which seems reasonable at this time given improvement in syndrome.  Will have patient follow-up with neurology in the outpatient setting.  Clinical Impression:  1. Dizziness      Discharge   Final Clinical Impression(s) / ED Diagnoses Final diagnoses:  Dizziness    Rx / DC Orders ED Discharge Orders     None         Tretha Sciara, MD 11/11/21 2346

## 2021-11-11 NOTE — Discharge Instructions (Signed)
You presented today after an episode of dizziness.  You underwent a stroke work-up that did not reveal any acute strokes.  You do have chronic abnormalities from your prior stroke thought to be from your patent foramen ovale. We have discussed ongoing care and management, ultimately this sounds to be most consistent with a atypical migraine and we will pursue treatment at this time with plan to be reassessed by her primary care provider in the outpatient setting as well as with neurology in outpatient setting.  Please return if you have any sudden change in your symptoms including sudden onset of dizziness, recurrence of severe headache or any other concerning findings.  Thanks Daniel Schneider to participate in your care, Daniel Sciara MD.  Please note the medications we are giving you tonight will make you very sleepy, it is important that you allow your significant other to drive you home this evening.

## 2021-11-27 DIAGNOSIS — M5451 Vertebrogenic low back pain: Secondary | ICD-10-CM | POA: Diagnosis not present

## 2021-12-08 DIAGNOSIS — M5451 Vertebrogenic low back pain: Secondary | ICD-10-CM | POA: Diagnosis not present

## 2022-02-10 DIAGNOSIS — M5451 Vertebrogenic low back pain: Secondary | ICD-10-CM | POA: Diagnosis not present

## 2022-02-16 DIAGNOSIS — M5416 Radiculopathy, lumbar region: Secondary | ICD-10-CM | POA: Diagnosis not present

## 2022-02-23 DIAGNOSIS — M5416 Radiculopathy, lumbar region: Secondary | ICD-10-CM | POA: Diagnosis not present

## 2022-04-06 DIAGNOSIS — E7800 Pure hypercholesterolemia, unspecified: Secondary | ICD-10-CM | POA: Diagnosis not present

## 2022-04-06 DIAGNOSIS — R946 Abnormal results of thyroid function studies: Secondary | ICD-10-CM | POA: Diagnosis not present

## 2022-04-06 DIAGNOSIS — R7303 Prediabetes: Secondary | ICD-10-CM | POA: Diagnosis not present

## 2022-04-06 DIAGNOSIS — Z125 Encounter for screening for malignant neoplasm of prostate: Secondary | ICD-10-CM | POA: Diagnosis not present

## 2022-04-13 DIAGNOSIS — M5116 Intervertebral disc disorders with radiculopathy, lumbar region: Secondary | ICD-10-CM | POA: Diagnosis not present

## 2022-04-13 DIAGNOSIS — R7303 Prediabetes: Secondary | ICD-10-CM | POA: Diagnosis not present

## 2022-04-13 DIAGNOSIS — M5136 Other intervertebral disc degeneration, lumbar region: Secondary | ICD-10-CM | POA: Diagnosis not present

## 2022-04-13 DIAGNOSIS — Z0000 Encounter for general adult medical examination without abnormal findings: Secondary | ICD-10-CM | POA: Diagnosis not present

## 2022-04-13 DIAGNOSIS — E7800 Pure hypercholesterolemia, unspecified: Secondary | ICD-10-CM | POA: Diagnosis not present

## 2022-04-14 DIAGNOSIS — M5451 Vertebrogenic low back pain: Secondary | ICD-10-CM | POA: Diagnosis not present

## 2022-04-19 DIAGNOSIS — M5416 Radiculopathy, lumbar region: Secondary | ICD-10-CM | POA: Diagnosis not present

## 2022-04-20 DIAGNOSIS — M5416 Radiculopathy, lumbar region: Secondary | ICD-10-CM | POA: Diagnosis not present

## 2022-05-03 DIAGNOSIS — M5416 Radiculopathy, lumbar region: Secondary | ICD-10-CM | POA: Diagnosis not present

## 2022-05-11 DIAGNOSIS — M5416 Radiculopathy, lumbar region: Secondary | ICD-10-CM | POA: Diagnosis not present

## 2022-10-10 DIAGNOSIS — R030 Elevated blood-pressure reading, without diagnosis of hypertension: Secondary | ICD-10-CM | POA: Diagnosis not present

## 2022-10-10 DIAGNOSIS — Z8249 Family history of ischemic heart disease and other diseases of the circulatory system: Secondary | ICD-10-CM | POA: Diagnosis not present

## 2022-10-10 DIAGNOSIS — Z8673 Personal history of transient ischemic attack (TIA), and cerebral infarction without residual deficits: Secondary | ICD-10-CM | POA: Diagnosis not present

## 2022-10-13 DIAGNOSIS — E7800 Pure hypercholesterolemia, unspecified: Secondary | ICD-10-CM | POA: Diagnosis not present

## 2022-10-13 DIAGNOSIS — R7303 Prediabetes: Secondary | ICD-10-CM | POA: Diagnosis not present

## 2022-10-19 ENCOUNTER — Other Ambulatory Visit (HOSPITAL_BASED_OUTPATIENT_CLINIC_OR_DEPARTMENT_OTHER): Payer: Self-pay | Admitting: Family Medicine

## 2022-10-19 DIAGNOSIS — M5116 Intervertebral disc disorders with radiculopathy, lumbar region: Secondary | ICD-10-CM | POA: Diagnosis not present

## 2022-10-19 DIAGNOSIS — M5136 Other intervertebral disc degeneration, lumbar region: Secondary | ICD-10-CM | POA: Diagnosis not present

## 2022-10-19 DIAGNOSIS — R7303 Prediabetes: Secondary | ICD-10-CM | POA: Diagnosis not present

## 2022-10-19 DIAGNOSIS — Z125 Encounter for screening for malignant neoplasm of prostate: Secondary | ICD-10-CM | POA: Diagnosis not present

## 2022-10-19 DIAGNOSIS — Z79899 Other long term (current) drug therapy: Secondary | ICD-10-CM | POA: Diagnosis not present

## 2022-10-19 DIAGNOSIS — E7800 Pure hypercholesterolemia, unspecified: Secondary | ICD-10-CM

## 2022-10-19 DIAGNOSIS — R946 Abnormal results of thyroid function studies: Secondary | ICD-10-CM | POA: Diagnosis not present

## 2022-10-22 ENCOUNTER — Ambulatory Visit (HOSPITAL_COMMUNITY)
Admission: RE | Admit: 2022-10-22 | Discharge: 2022-10-22 | Disposition: A | Discharge status: Home / Self Care | Payer: 59 | Source: Ambulatory Visit | Attending: Family Medicine | Admitting: Family Medicine

## 2022-10-22 DIAGNOSIS — E7800 Pure hypercholesterolemia, unspecified: Secondary | ICD-10-CM | POA: Insufficient documentation

## 2023-01-15 IMAGING — MR MR LUMBAR SPINE W/O CM
4 of 5 series · 27 of 48 positions shown · non-contrast
Comparison: No prior MRI

CLINICAL DATA: Degenerative disc disease

EXAM:
MRI LUMBAR SPINE WITHOUT CONTRAST
TECHNIQUE: Multiplanar, multisequence MR imaging of the lumbar spine was
performed. No intravenous contrast was administered.

[Series 2: T2 · sagittal · 4.0mm · 0.81mm/px · 6 of 15 slices shown (1 of 2)]
[im 1/15]
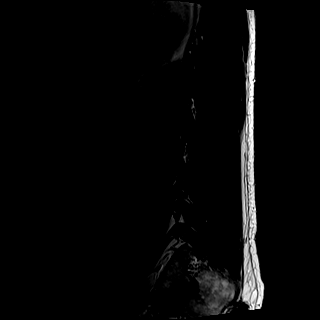
[im 3/15]
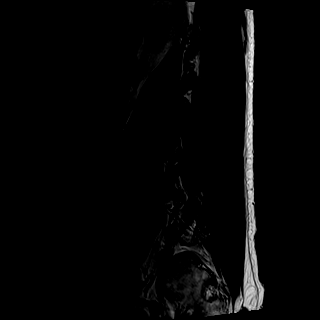
[im 6/15]
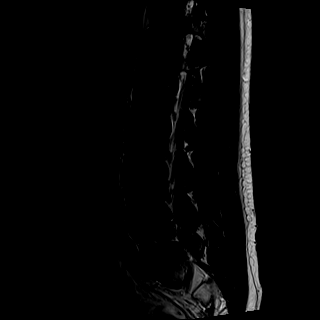
[im 9/15]
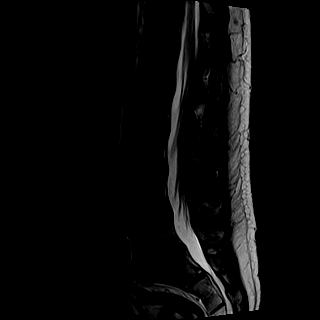
[im 12/15]
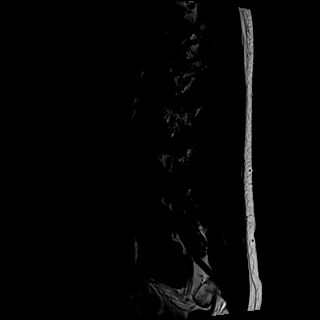
[im 15/15]
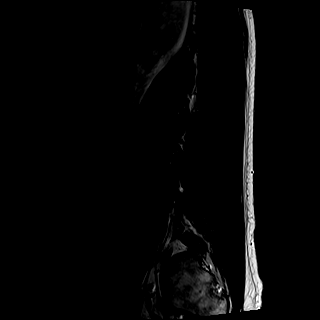

[Series 3: T1 · sagittal · 4.0mm · 0.41mm/px · 5 of 15 slices shown (1 of 2)]
[im 1/15]
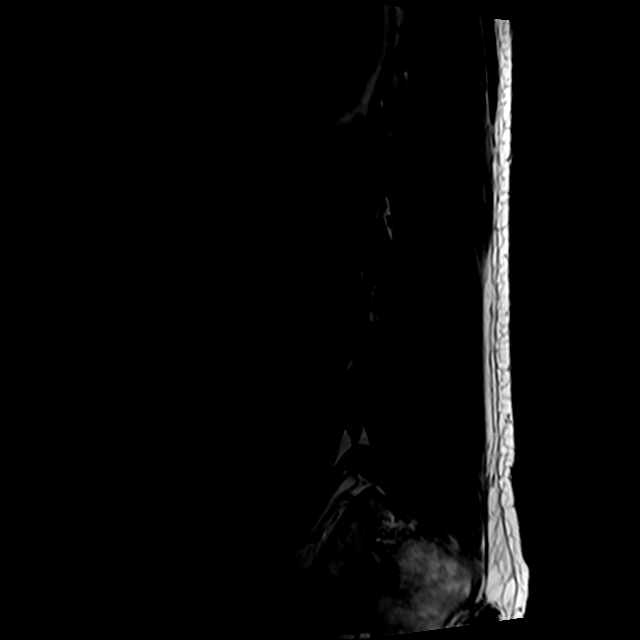
[im 4/15]
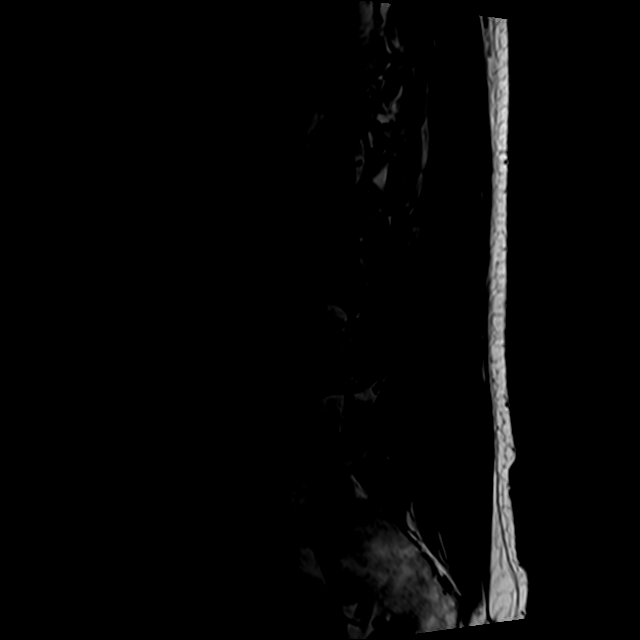
[im 8/15]
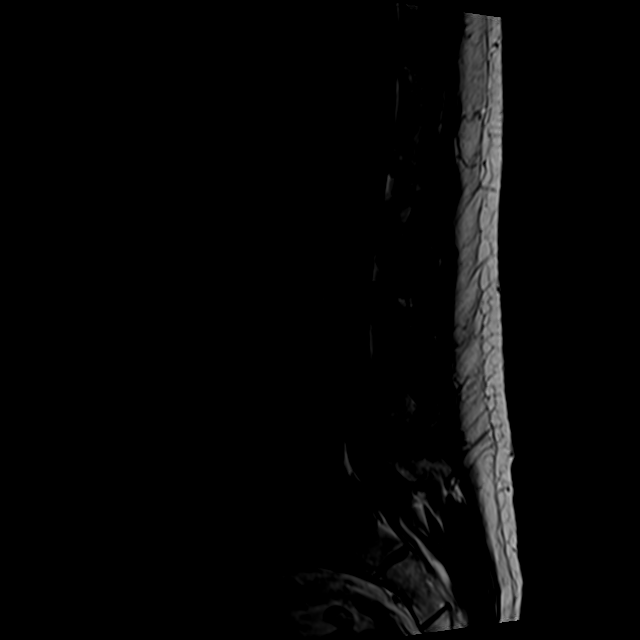
[im 11/15]
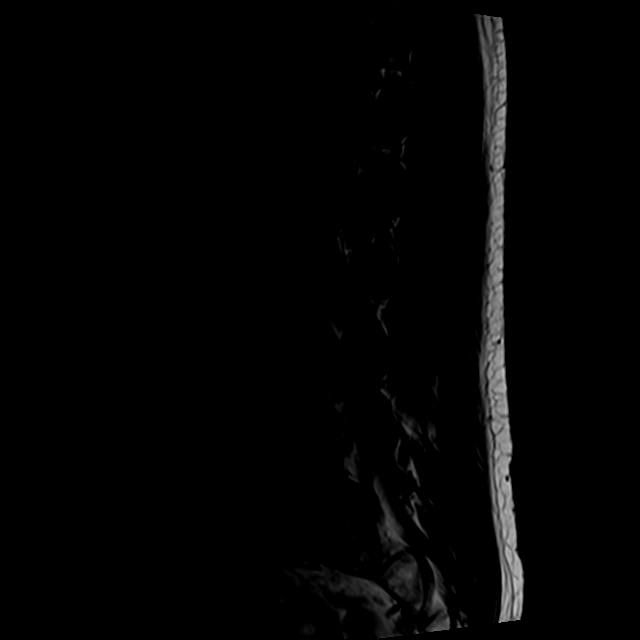
[im 15/15]
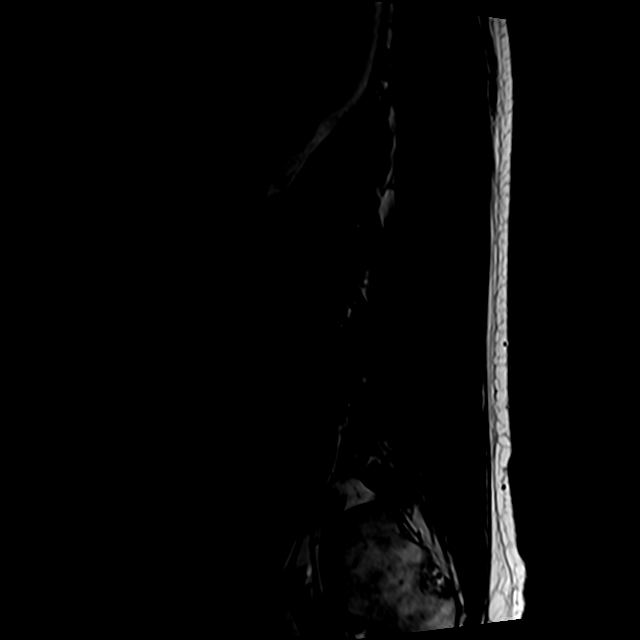

[Series 5: T2 · axial · 4.0mm · 0.78mm/px · z∈[-123,+105]mm · 10 of 43 slices shown (2 of 2)]
[im 3/43]
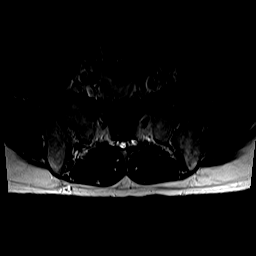
[im 6/43]
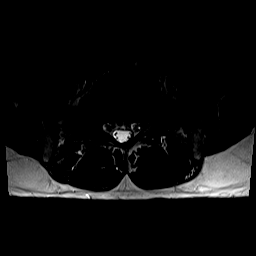
[im 9/43]
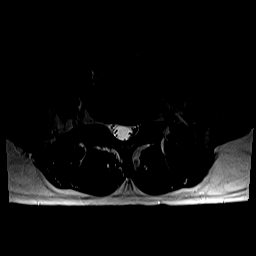
[im 15/43]
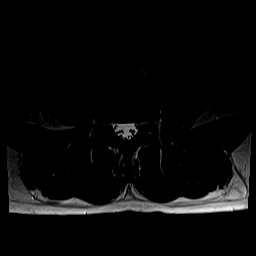
[im 20/43]
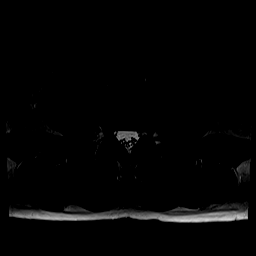
[im 23/43]
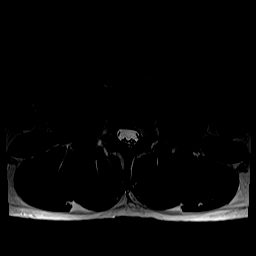
[im 26/43]
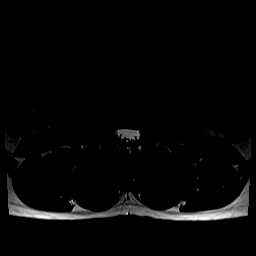
[im 31/43]
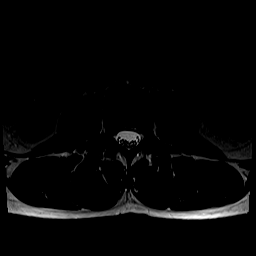
[im 37/43]
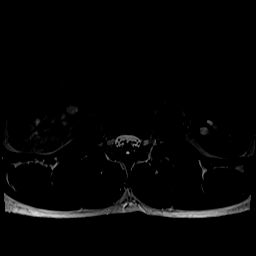
[im 43/43]
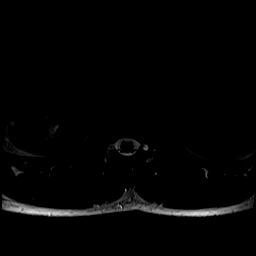

[Series 6: T1 · axial · 4.0mm · 0.39mm/px · z∈[-123,+75]mm · 6 of 43 slices shown (2 of 2)]
[im 3/43]
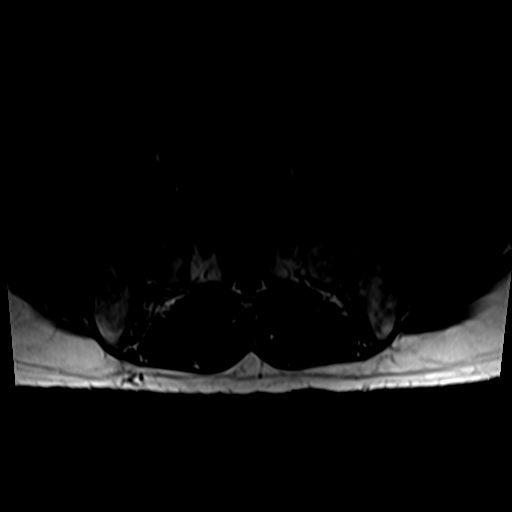
[im 6/43]
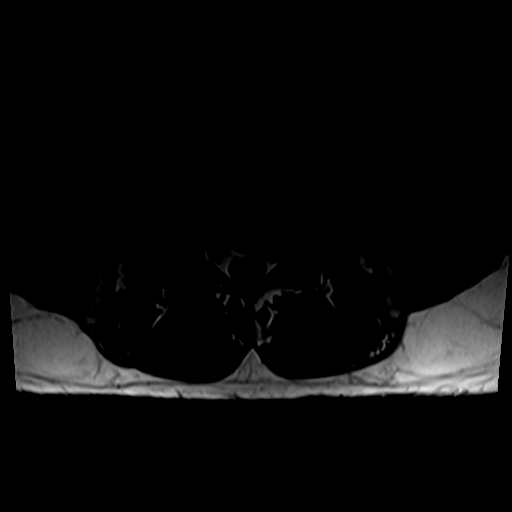
[im 9/43]
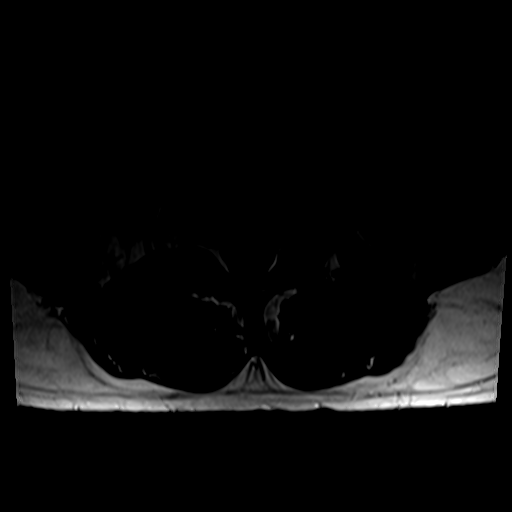
[im 15/43]
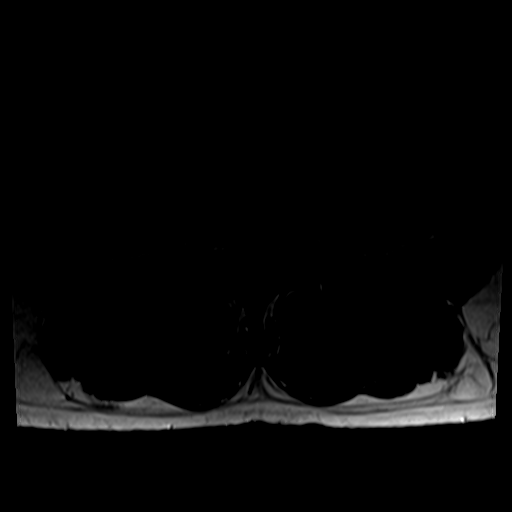
[im 23/43]
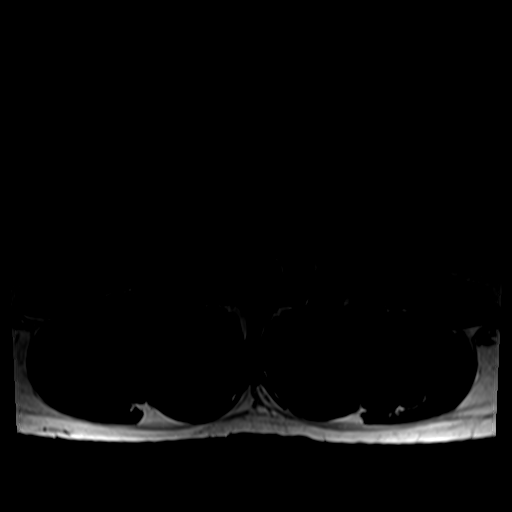
[im 37/43]
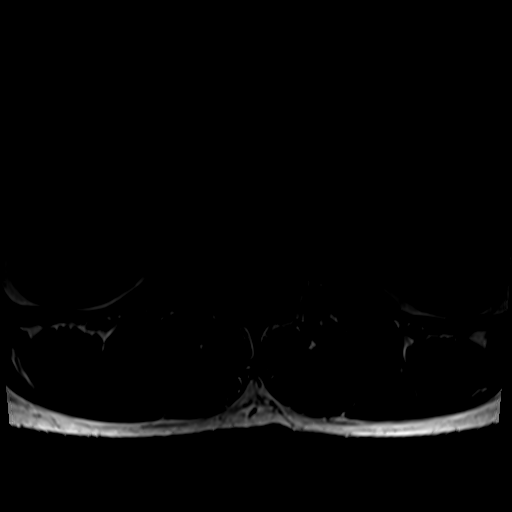

[27 of 48 positions shown; findings below may reference images not displayed]

FINDINGS: Segmentation:  Standard.

Alignment:  Minimal levocurvature.  No listhesis.

Vertebrae:  No acute fracture or suspicious osseous lesion.

Conus medullaris and cauda equina: Conus extends to the L2 level.
Conus and cauda equina appear normal.

Paraspinal and other soft tissues: Small renal cysts. Otherwise
negative.

Disc levels:

T12-L1: No significant disc bulge. No spinal canal stenosis or
neural foraminal narrowing.

L1-L2: No significant disc bulge. No spinal canal stenosis or neural
foraminal narrowing.

L2-L3: No significant disc bulge. Mild facet arthropathy. No spinal
canal stenosis or neural foraminal narrowing.

L3-L4: No significant disc bulge. Mild facet arthropathy. No spinal
canal stenosis or neural foraminal narrowing.

L4-L5: Minimal disc bulge. Mild facet arthropathy. No spinal canal
stenosis or neural foraminal narrowing.

L5-S1: Disc desiccation and moderate disc bulge with superimposed
left paracentral and subarticular extrusion with minimal caudal
migration. This narrows the left lateral recess and contacts the
descending left S1 nerve roots (series 5, image 36). Mild facet
arthropathy. No spinal canal stenosis. Moderate left and mild right
neural foraminal narrowing.
IMPRESSION: L5-S1 left paracentral and subarticular disc extrusion contacts the
descending left S1 nerve roots. In addition, at this level, there is
moderate left and mild right neural foraminal narrowing. No spinal
canal stenosis.

## 2023-12-01 ENCOUNTER — Emergency Department (HOSPITAL_BASED_OUTPATIENT_CLINIC_OR_DEPARTMENT_OTHER)
Admission: EM | Admit: 2023-12-01 | Discharge: 2023-12-01 | Disposition: A | Discharge status: Home / Self Care | Attending: Emergency Medicine | Admitting: Emergency Medicine

## 2023-12-01 ENCOUNTER — Encounter (HOSPITAL_BASED_OUTPATIENT_CLINIC_OR_DEPARTMENT_OTHER): Payer: Self-pay | Admitting: Emergency Medicine

## 2023-12-01 ENCOUNTER — Other Ambulatory Visit: Payer: Self-pay

## 2023-12-01 ENCOUNTER — Emergency Department (HOSPITAL_BASED_OUTPATIENT_CLINIC_OR_DEPARTMENT_OTHER): Admitting: Radiology

## 2023-12-01 DIAGNOSIS — Y92410 Unspecified street and highway as the place of occurrence of the external cause: Secondary | ICD-10-CM | POA: Insufficient documentation

## 2023-12-01 DIAGNOSIS — M545 Low back pain, unspecified: Secondary | ICD-10-CM | POA: Diagnosis present

## 2023-12-01 MED ORDER — LIDOCAINE 5 % EX PTCH: 1.0000 | MEDICATED_PATCH | CUTANEOUS | 0 refills | Status: AC

## 2023-12-01 MED ORDER — NAPROXEN 500 MG PO TABS: 500.0000 mg | ORAL_TABLET | Freq: Two times a day (BID) | ORAL | 0 refills | Status: AC

## 2023-12-01 MED ORDER — METHOCARBAMOL 500 MG PO TABS: 500.0000 mg | ORAL_TABLET | Freq: Two times a day (BID) | ORAL | 0 refills | Status: AC

## 2023-12-01 NOTE — Discharge Instructions (Signed)
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
